# Patient Record
Sex: Male | Born: 1955 | Race: White | Hispanic: No | Marital: Married | Smoking: Former smoker
Health system: Southern US, Community
[De-identification: ages and names within clinical notes are randomized; demographics above are authoritative.]

## PROBLEM LIST (undated history)

## (undated) DIAGNOSIS — I2510 Atherosclerotic heart disease of native coronary artery without angina pectoris: Secondary | ICD-10-CM

## (undated) DIAGNOSIS — F32A Depression, unspecified: Secondary | ICD-10-CM

## (undated) DIAGNOSIS — I82409 Acute embolism and thrombosis of unspecified deep veins of unspecified lower extremity: Secondary | ICD-10-CM

## (undated) DIAGNOSIS — M549 Dorsalgia, unspecified: Secondary | ICD-10-CM

## (undated) DIAGNOSIS — F329 Major depressive disorder, single episode, unspecified: Secondary | ICD-10-CM

## (undated) DIAGNOSIS — J45909 Unspecified asthma, uncomplicated: Secondary | ICD-10-CM

## (undated) DIAGNOSIS — K922 Gastrointestinal hemorrhage, unspecified: Secondary | ICD-10-CM

## (undated) DIAGNOSIS — D649 Anemia, unspecified: Secondary | ICD-10-CM

## (undated) DIAGNOSIS — F1010 Alcohol abuse, uncomplicated: Secondary | ICD-10-CM

## (undated) DIAGNOSIS — I2699 Other pulmonary embolism without acute cor pulmonale: Secondary | ICD-10-CM

## (undated) DIAGNOSIS — E785 Hyperlipidemia, unspecified: Secondary | ICD-10-CM

## (undated) DIAGNOSIS — M1990 Unspecified osteoarthritis, unspecified site: Secondary | ICD-10-CM

## (undated) DIAGNOSIS — N529 Male erectile dysfunction, unspecified: Secondary | ICD-10-CM

## (undated) DIAGNOSIS — F419 Anxiety disorder, unspecified: Secondary | ICD-10-CM

## (undated) DIAGNOSIS — G8929 Other chronic pain: Secondary | ICD-10-CM

## (undated) DIAGNOSIS — G2581 Restless legs syndrome: Secondary | ICD-10-CM

## (undated) DIAGNOSIS — K219 Gastro-esophageal reflux disease without esophagitis: Secondary | ICD-10-CM

## (undated) HISTORY — DX: Other pulmonary embolism without acute cor pulmonale: I26.99

## (undated) HISTORY — PX: IVC FILTER PLACEMENT (ARMC HX): HXRAD1551

## (undated) HISTORY — DX: Major depressive disorder, single episode, unspecified: F32.9

## (undated) HISTORY — DX: Atherosclerotic heart disease of native coronary artery without angina pectoris: I25.10

## (undated) HISTORY — DX: Anxiety disorder, unspecified: F41.9

## (undated) HISTORY — DX: Gastro-esophageal reflux disease without esophagitis: K21.9

## (undated) HISTORY — PX: NOSE SURGERY: SHX723

## (undated) HISTORY — DX: Gastrointestinal hemorrhage, unspecified: K92.2

## (undated) HISTORY — DX: Male erectile dysfunction, unspecified: N52.9

## (undated) HISTORY — PX: TOTAL HIP ARTHROPLASTY: SHX124

## (undated) HISTORY — DX: Unspecified asthma, uncomplicated: J45.909

## (undated) HISTORY — DX: Unspecified osteoarthritis, unspecified site: M19.90

## (undated) HISTORY — DX: Anemia, unspecified: D64.9

## (undated) HISTORY — DX: Depression, unspecified: F32.A

## (undated) HISTORY — PX: JOINT REPLACEMENT: SHX530

## (undated) HISTORY — DX: Hyperlipidemia, unspecified: E78.5

## (undated) HISTORY — DX: Acute embolism and thrombosis of unspecified deep veins of unspecified lower extremity: I82.409

---

## 2004-03-24 ENCOUNTER — Emergency Department: Payer: Self-pay | Admitting: Emergency Medicine

## 2005-07-01 ENCOUNTER — Other Ambulatory Visit: Payer: Self-pay

## 2005-07-01 ENCOUNTER — Emergency Department: Payer: Self-pay | Admitting: Emergency Medicine

## 2005-07-12 ENCOUNTER — Encounter: Payer: Self-pay | Admitting: Orthopedic Surgery

## 2005-08-07 ENCOUNTER — Encounter: Payer: Self-pay | Admitting: Orthopedic Surgery

## 2006-02-07 DIAGNOSIS — I82409 Acute embolism and thrombosis of unspecified deep veins of unspecified lower extremity: Secondary | ICD-10-CM

## 2006-02-07 HISTORY — DX: Acute embolism and thrombosis of unspecified deep veins of unspecified lower extremity: I82.409

## 2006-04-08 HISTORY — PX: CORONARY ANGIOPLASTY WITH STENT PLACEMENT: SHX49

## 2006-04-21 ENCOUNTER — Inpatient Hospital Stay: Payer: Self-pay | Admitting: Internal Medicine

## 2006-04-21 ENCOUNTER — Other Ambulatory Visit: Payer: Self-pay

## 2006-04-24 ENCOUNTER — Other Ambulatory Visit: Payer: Self-pay

## 2006-05-07 ENCOUNTER — Inpatient Hospital Stay: Payer: Self-pay | Admitting: Internal Medicine

## 2006-05-07 ENCOUNTER — Other Ambulatory Visit: Payer: Self-pay

## 2006-05-08 ENCOUNTER — Other Ambulatory Visit: Payer: Self-pay

## 2006-06-21 ENCOUNTER — Encounter: Payer: Self-pay | Admitting: Cardiovascular Disease

## 2006-07-09 ENCOUNTER — Encounter: Payer: Self-pay | Admitting: Cardiovascular Disease

## 2006-08-08 ENCOUNTER — Encounter: Payer: Self-pay | Admitting: Cardiovascular Disease

## 2006-09-08 ENCOUNTER — Encounter: Payer: Self-pay | Admitting: Cardiovascular Disease

## 2006-10-09 ENCOUNTER — Encounter: Payer: Self-pay | Admitting: Cardiovascular Disease

## 2007-01-09 ENCOUNTER — Inpatient Hospital Stay: Payer: Self-pay | Admitting: Internal Medicine

## 2007-01-09 ENCOUNTER — Other Ambulatory Visit: Payer: Self-pay

## 2007-01-11 ENCOUNTER — Other Ambulatory Visit: Payer: Self-pay

## 2007-02-06 ENCOUNTER — Emergency Department: Payer: Self-pay | Admitting: Emergency Medicine

## 2007-02-08 DIAGNOSIS — D649 Anemia, unspecified: Secondary | ICD-10-CM

## 2007-02-08 HISTORY — DX: Anemia, unspecified: D64.9

## 2007-02-12 ENCOUNTER — Other Ambulatory Visit: Payer: Self-pay

## 2007-02-12 ENCOUNTER — Observation Stay: Payer: Self-pay | Admitting: Internal Medicine

## 2007-02-13 ENCOUNTER — Other Ambulatory Visit: Payer: Self-pay

## 2007-02-21 ENCOUNTER — Observation Stay: Payer: Self-pay | Admitting: Internal Medicine

## 2007-02-21 ENCOUNTER — Ambulatory Visit: Payer: Self-pay | Admitting: Oncology

## 2007-03-14 ENCOUNTER — Ambulatory Visit: Payer: Self-pay | Admitting: Gastroenterology

## 2007-07-09 HISTORY — PX: CARDIAC CATHETERIZATION: SHX172

## 2007-07-25 ENCOUNTER — Other Ambulatory Visit: Payer: Self-pay

## 2007-07-25 ENCOUNTER — Observation Stay: Payer: Self-pay | Admitting: Internal Medicine

## 2007-11-16 ENCOUNTER — Inpatient Hospital Stay: Payer: Self-pay | Admitting: Specialist

## 2007-11-16 ENCOUNTER — Other Ambulatory Visit: Payer: Self-pay

## 2008-09-15 ENCOUNTER — Ambulatory Visit: Payer: Self-pay | Admitting: Unknown Physician Specialty

## 2008-09-22 ENCOUNTER — Emergency Department: Payer: Self-pay | Admitting: Emergency Medicine

## 2008-10-23 ENCOUNTER — Ambulatory Visit: Payer: Self-pay | Admitting: Anesthesiology

## 2008-10-28 ENCOUNTER — Ambulatory Visit: Payer: Self-pay | Admitting: Anesthesiology

## 2008-12-02 ENCOUNTER — Ambulatory Visit: Payer: Self-pay | Admitting: Internal Medicine

## 2008-12-16 ENCOUNTER — Ambulatory Visit: Payer: Self-pay | Admitting: Anesthesiology

## 2009-03-05 ENCOUNTER — Ambulatory Visit: Payer: Self-pay | Admitting: Anesthesiology

## 2009-03-31 ENCOUNTER — Inpatient Hospital Stay: Payer: Self-pay | Admitting: Internal Medicine

## 2009-05-19 ENCOUNTER — Ambulatory Visit: Payer: Self-pay | Admitting: Anesthesiology

## 2009-09-15 ENCOUNTER — Ambulatory Visit: Payer: Self-pay | Admitting: Anesthesiology

## 2009-12-09 ENCOUNTER — Ambulatory Visit: Payer: Self-pay | Admitting: Otolaryngology

## 2009-12-14 ENCOUNTER — Ambulatory Visit: Payer: Self-pay | Admitting: Anesthesiology

## 2010-03-30 ENCOUNTER — Ambulatory Visit: Payer: Self-pay | Admitting: Anesthesiology

## 2010-11-09 ENCOUNTER — Encounter: Payer: Self-pay | Admitting: Cardiovascular Disease

## 2010-11-19 ENCOUNTER — Encounter: Payer: Self-pay | Admitting: Cardiovascular Disease

## 2010-11-22 ENCOUNTER — Ambulatory Visit: Payer: Self-pay | Admitting: Cardiovascular Disease

## 2010-11-26 ENCOUNTER — Ambulatory Visit: Payer: Self-pay | Admitting: Cardiovascular Disease

## 2010-12-13 ENCOUNTER — Encounter: Payer: Self-pay | Admitting: Cardiovascular Disease

## 2010-12-13 ENCOUNTER — Ambulatory Visit (INDEPENDENT_AMBULATORY_CARE_PROVIDER_SITE_OTHER): Payer: 59 | Admitting: Cardiovascular Disease

## 2010-12-13 VITALS — BP 131/87 | HR 72 | Ht 72.0 in | Wt 174.0 lb

## 2010-12-13 DIAGNOSIS — I2510 Atherosclerotic heart disease of native coronary artery without angina pectoris: Secondary | ICD-10-CM

## 2010-12-13 DIAGNOSIS — N529 Male erectile dysfunction, unspecified: Secondary | ICD-10-CM

## 2010-12-13 DIAGNOSIS — I10 Essential (primary) hypertension: Secondary | ICD-10-CM

## 2010-12-13 DIAGNOSIS — E785 Hyperlipidemia, unspecified: Secondary | ICD-10-CM

## 2010-12-13 MED ORDER — SILDENAFIL CITRATE 50 MG PO TABS: 50.0000 mg | ORAL_TABLET | Freq: Every day | ORAL | Status: AC | PRN

## 2010-12-13 NOTE — Patient Instructions (Signed)
Your physician wants you to follow-up in: 6 months in [place]. You will receive a reminder letter in the mail one-two months in advance. If you don't receive a letter, please call our office to schedule the follow-up appointment. Use Viagra 50 mg no more than 1 tablet daily as needed.

## 2010-12-14 ENCOUNTER — Encounter: Payer: Self-pay | Admitting: Cardiovascular Disease

## 2010-12-14 DIAGNOSIS — I2510 Atherosclerotic heart disease of native coronary artery without angina pectoris: Secondary | ICD-10-CM | POA: Insufficient documentation

## 2010-12-14 DIAGNOSIS — E785 Hyperlipidemia, unspecified: Secondary | ICD-10-CM | POA: Insufficient documentation

## 2010-12-14 DIAGNOSIS — N529 Male erectile dysfunction, unspecified: Secondary | ICD-10-CM | POA: Insufficient documentation

## 2010-12-14 DIAGNOSIS — I10 Essential (primary) hypertension: Secondary | ICD-10-CM | POA: Insufficient documentation

## 2010-12-14 NOTE — Assessment & Plan Note (Signed)
Would provide the patient with Viagra 50 mg as needed as this worked better for him in the past. There is no contraindication given that he is not on nitroglycerin and his cardiac situation has been overall stable.

## 2010-12-14 NOTE — Progress Notes (Signed)
HPI  This is a 55 year old male who is transferring his cardiac care from [facility]. He is well-known to me. He has known history of coronary artery disease status post angioplasty and drug-eluting stent placement to the LAD as well as OM and 2008. Later that year, he was diagnosed with pulmonary embolism and was treated with warfarin. White he was on triple therapy, he had GI bleed from unknown source. His Plavix was discontinued after a year of treatment. Also he was taken off warfarin after completion of treatment. He did have increased dyspnea and atypical chest discomfort in 2009. He had a cardiac catheterization done at that time which showed patent stents and mild disease in the right coronary artery. Since then, the patient has not had any new cardiac events. His most recent stress test and echocardiogram were done in October of 2011. Both of them were unremarkable. He seems to be doing very well at this time. He denies any chest pain or dyspnea. He does complain of erectile dysfunction. Dr. Ellsworth Lennox gave him samples for Cialis but he did not respond to that.  No Known Allergies   No current outpatient prescriptions on file prior to visit.     Past Medical History  Diagnosis Date  . Pulmonary embolism   . Anemia 2009    while he was on Warfarin, Aspirin and Plavix. Resolved.   . Blood in stool   . Coronary artery disease   . Hyperlipidemia   . Essential hypertension   . ED (erectile dysfunction)      Past Surgical History  Procedure Date  . Total hip arthroplasty   . Coronary angioplasty with stent placement 04/2006    Cypher DES to LAD and OM1  . Cardiac catheterization 07/2007    Patent LAD/OM stents. Stable 40% stenosis in proximal RCA     History reviewed. No pertinent family history.   History   Social History  . Marital Status: Married    Spouse Name: N/A    Number of Children: 5  . Years of Education: N/A   Occupational History  .  Building Maintenance    Social History Main Topics  . Smoking status: Former Smoker -- 1.5 packs/day for 30 years    Types: Cigarettes    Quit date: 04/08/2006  . Smokeless tobacco: Never Used  . Alcohol Use: No  . Drug Use: No  . Sexually Active: Not on file   Other Topics Concern  . Not on file   Social History Narrative  . No narrative on file     PHYSICAL EXAM   BP 131/87  Pulse 72  Ht 6' (1.829 m)  Wt 174 lb (78.926 kg)  BMI 23.60 kg/m2  Constitutional: He is oriented to person, place, and time. He appears well-developed and well-nourished. No distress.  HENT: No nasal discharge.  Head: Normocephalic and atraumatic.  Eyes: Pupils are equal, round, and reactive to light. Right eye exhibits no discharge. Left eye exhibits no discharge.  Neck: Normal range of motion. Neck supple. No JVD present. No thyromegaly present.  Cardiovascular: Normal rate, regular rhythm, normal heart sounds and intact distal pulses. Exam reveals no gallop and no friction rub.  No murmur heard.  Pulmonary/Chest: Effort normal and breath sounds normal. No stridor. No respiratory distress. He has no wheezes. He has no rales. He exhibits no tenderness.  Abdominal: Soft. Bowel sounds are normal. He exhibits no distension. There is no tenderness. There is no rebound and no guarding.  Musculoskeletal: Normal range of motion. He exhibits no edema and no tenderness.  Neurological: He is alert and oriented to person, place, and time. Coordination normal.  Skin: Skin is warm and dry. No rash noted. He is not diaphoretic. No erythema. No pallor.  Psychiatric: He has a normal mood and affect. His behavior is normal. Judgment and thought content normal.      ASSESSMENT AND PLAN

## 2010-12-14 NOTE — Assessment & Plan Note (Signed)
His blood pressure is well controlled. Continue current medications. 

## 2010-12-14 NOTE — Assessment & Plan Note (Signed)
He is on atorvastatin 40 mg once daily. It appears that his LDL is above target. Thus, Zetia 10 mg daily was added. Target LDL is less than 70.

## 2010-12-14 NOTE — Assessment & Plan Note (Signed)
The patient seems to be doing very well at this time. He denies any chest pain or dyspnea. Most recent noninvasive cardiac evaluation was in October of 2011 which was unremarkable. I recommend continuing medical therapy for now.

## 2011-08-29 ENCOUNTER — Telehealth: Payer: Self-pay

## 2011-08-29 ENCOUNTER — Encounter: Payer: Self-pay | Admitting: Cardiovascular Disease

## 2011-08-29 ENCOUNTER — Ambulatory Visit (INDEPENDENT_AMBULATORY_CARE_PROVIDER_SITE_OTHER): Payer: 59 | Admitting: Cardiovascular Disease

## 2011-08-29 VITALS — BP 120/82 | HR 80 | Ht 72.0 in | Wt 172.0 lb

## 2011-08-29 DIAGNOSIS — E785 Hyperlipidemia, unspecified: Secondary | ICD-10-CM

## 2011-08-29 DIAGNOSIS — I2510 Atherosclerotic heart disease of native coronary artery without angina pectoris: Secondary | ICD-10-CM

## 2011-08-29 MED ORDER — AMLODIPINE BESYLATE 5 MG PO TABS: 5.0000 mg | ORAL_TABLET | Freq: Every day | ORAL | Status: DC

## 2011-08-29 MED ORDER — EZETIMIBE-ATORVASTATIN 10-40 MG PO TABS: 1.0000 | ORAL_TABLET | Freq: Every day | ORAL | Status: DC

## 2011-08-29 MED ORDER — OMEPRAZOLE 20 MG PO CPDR: 20.0000 mg | DELAYED_RELEASE_CAPSULE | Freq: Every day | ORAL | Status: AC

## 2011-08-29 MED ORDER — METOPROLOL TARTRATE 25 MG PO TABS: 25.0000 mg | ORAL_TABLET | Freq: Two times a day (BID) | ORAL | Status: DC

## 2011-08-29 MED ORDER — RAMIPRIL 5 MG PO CAPS: 5.0000 mg | ORAL_CAPSULE | Freq: Every day | ORAL | Status: DC

## 2011-08-29 NOTE — Progress Notes (Signed)
HPI  This is a 56 year old male who is here today for a followup visit. He has known history of coronary artery disease status post angioplasty and drug-eluting stent placement to the LAD as well as OM and 2008. Later that year, he was diagnosed with pulmonary embolism and was treated with warfarin. While he was on triple therapy, he had GI bleed from unknown source. His Plavix was discontinued after a year of treatment. Also he was taken off warfarin after completion of treatment. He did have increased dyspnea and atypical chest discomfort in 2009. He had a cardiac catheterization done at that time which showed patent stents and mild disease in the right coronary artery. Since then, the patient has not had any new cardiac events. His most recent stress test and echocardiogram were done in October of 2011. Both of them were unremarkable.  He continues to do well. He denies any chest pain, dyspnea or palpitations. He occasionally gets dizzy when he changes position suddenly. He has been taking his medications regularly but is having hard time affording Zetia.     No Known Allergies   Current Outpatient Prescriptions on File Prior to Visit  Medication Sig Dispense Refill  . amLODipine (NORVASC) 5 MG tablet Take 5 mg by mouth daily.      . cyclobenzaprine (FLEXERIL) 10 MG tablet Take 1 tablet by mouth Twice daily.      . Ferrous Sulfate (PX IRON) 27 MG TABS Take 1 tablet by mouth daily.        Marland Kitchen gabapentin (NEURONTIN) 300 MG capsule Take 1 capsule by mouth Twice daily.      . metoprolol tartrate (LOPRESSOR) 25 MG tablet Take 25 mg by mouth 2 (two) times daily.        Marland Kitchen omeprazole (PRILOSEC) 20 MG capsule Take 20 mg by mouth daily.        . Potassium Gluconate 595 MG CAPS Take 1 capsule by mouth daily.        . promethazine (PHENERGAN) 25 MG tablet Take 1 tablet by mouth Every 4 hours as needed.      . ramipril (ALTACE) 5 MG capsule Take 1 capsule by mouth Daily.      Marland Kitchen tiZANidine (ZANAFLEX) 2  MG tablet Take 2 mg by mouth at bedtime.           Past Medical History  Diagnosis Date  . Pulmonary embolism   . Anemia 2009    while he was on Warfarin, Aspirin and Plavix. Resolved.   . Blood in stool   . Coronary artery disease   . Hyperlipidemia   . Essential hypertension   . ED (erectile dysfunction)      Past Surgical History  Procedure Date  . Total hip arthroplasty   . Coronary angioplasty with stent placement 04/2006    Cypher DES to LAD and OM1  . Cardiac catheterization 07/2007    Patent LAD/OM stents. Stable 40% stenosis in proximal RCA     History reviewed. No pertinent family history.   History   Social History  . Marital Status: Married    Spouse Name: N/A    Number of Children: 5  . Years of Education: N/A   Occupational History  . Building Maintenance    Social History Main Topics  . Smoking status: Former Smoker -- 1.5 packs/day for 30 years    Types: Cigarettes    Quit date: 04/08/2006  . Smokeless tobacco: Never Used  . Alcohol Use: No  .  Drug Use: No  . Sexually Active: Not on file   Other Topics Concern  . Not on file   Social History Narrative  . No narrative on file      PHYSICAL EXAM   BP 120/82  Pulse 80  Ht 6' (1.829 m)  Wt 172 lb (78.019 kg)  BMI 23.33 kg/m2 Constitutional: He is oriented to person, place, and time. He appears well-developed and well-nourished. No distress.  HENT: No nasal discharge.  Head: Normocephalic and atraumatic.  Eyes: Pupils are equal and round. Right eye exhibits no discharge. Left eye exhibits no discharge.  Neck: Normal range of motion. Neck supple. No JVD present. No thyromegaly present.  Cardiovascular: Normal rate, regular rhythm, normal heart sounds and. Exam reveals no gallop and no friction rub. No murmur heard.  Pulmonary/Chest: Effort normal and breath sounds normal. No stridor. No respiratory distress. He has no wheezes. He has no rales. He exhibits no tenderness.  Abdominal:  Soft. Bowel sounds are normal. He exhibits no distension. There is no tenderness. There is no rebound and no guarding.  Musculoskeletal: Normal range of motion. He exhibits no edema and no tenderness.  Neurological: He is alert and oriented to person, place, and time. Coordination normal.  Skin: Skin is warm and dry. No rash noted. He is not diaphoretic. No erythema. No pallor.  Psychiatric: He has a normal mood and affect. His behavior is normal. Judgment and thought content normal.       EKG: Normal sinus rhythm.   ASSESSMENT AND PLAN

## 2011-08-29 NOTE — Patient Instructions (Addendum)
Stop Lipitor Stop Zetia Start Liptruzet 40/10 mg at bedtime. If too expensive, let us know.  Follow up in 6 months

## 2011-08-29 NOTE — Assessment & Plan Note (Signed)
He is on both atorvastatin and Zetia. His co-pay $60. I will stop both and switch him to Liptruzet 40/10 mg at bedtime. I provided him with a co-pay card. If this is still too expensive, we can consider switching him to high-dose Crestor.

## 2011-08-29 NOTE — Telephone Encounter (Signed)
Refill sent for ramipril, metoprolol tart, amlodipine and omeprazole to [facility].

## 2011-08-29 NOTE — Assessment & Plan Note (Signed)
His blood pressure is well controlled. Continue current medications. 

## 2011-08-29 NOTE — Assessment & Plan Note (Signed)
He is doing very well at this time with no symptoms suggestive of angina, heart failure or arrhythmia. Continue medical therapy.

## 2012-03-12 ENCOUNTER — Encounter: Payer: Self-pay | Admitting: Cardiovascular Disease

## 2012-03-12 ENCOUNTER — Ambulatory Visit (INDEPENDENT_AMBULATORY_CARE_PROVIDER_SITE_OTHER): Payer: 59 | Admitting: Cardiovascular Disease

## 2012-03-12 VITALS — BP 118/74 | HR 89 | Ht 72.0 in | Wt 171.0 lb

## 2012-03-12 DIAGNOSIS — M25559 Pain in unspecified hip: Secondary | ICD-10-CM | POA: Insufficient documentation

## 2012-03-12 DIAGNOSIS — I2510 Atherosclerotic heart disease of native coronary artery without angina pectoris: Secondary | ICD-10-CM

## 2012-03-12 DIAGNOSIS — E785 Hyperlipidemia, unspecified: Secondary | ICD-10-CM

## 2012-03-12 NOTE — Assessment & Plan Note (Signed)
This is at the site of previous hip surgery. He asked if it's safe for him to use Cymbalta for chronic pain. There is no contraindication from a cardiac standpoint. I asked him to discuss this with PCP.

## 2012-03-12 NOTE — Patient Instructions (Addendum)
Continue same medications  Follow up in 1 year

## 2012-03-12 NOTE — Assessment & Plan Note (Signed)
He is doing well with no symptoms suggestive of angina. Continue medical therapy. 

## 2012-03-12 NOTE — Assessment & Plan Note (Signed)
His blood pressure is well controlled. Continue current medications. 

## 2012-03-12 NOTE — Progress Notes (Signed)
HPI  This is a 57 year old male who is here today for a followup visit. He has known history of coronary artery disease status post angioplasty and drug-eluting stent placement to the LAD as well as OM and 2008. Later that year, he was diagnosed with pulmonary embolism and was treated with warfarin. While he was on triple therapy, he had GI bleed from unknown source. His Plavix was discontinued after a year of treatment. Also he was taken off warfarin after completion of treatment. He did have increased dyspnea and atypical chest discomfort in 2009. He had a cardiac catheterization done at that time which showed patent stents and mild disease in the right coronary artery. Since then, the patient has not had any new cardiac events. His most recent stress test and echocardiogram were done in October of 2011. Both of them were unremarkable.  He continues to do well. He denies any chest pain, dyspnea or palpitations.  He continues to have problems with the left hip related to previous hip surgery. He gets localized steroids injections.   No Known Allergies   Current Outpatient Prescriptions on File Prior to Visit  Medication Sig Dispense Refill  . amLODipine (NORVASC) 5 MG tablet Take 1 tablet (5 mg total) by mouth daily.  30 tablet  6  . aspirin 81 MG tablet Take 81 mg by mouth daily.      . cyclobenzaprine (FLEXERIL) 10 MG tablet Take 1 tablet by mouth Twice daily.      . Ezetimibe-Atorvastatin (LIPTRUZET) 10-40 MG TABS Take 1 tablet by mouth at bedtime.  30 tablet  6  . Ferrous Sulfate (PX IRON) 27 MG TABS Take 1 tablet by mouth daily.        Marland Kitchen gabapentin (NEURONTIN) 300 MG capsule Take 1 capsule by mouth Twice daily.      Marland Kitchen HYDROcodone-acetaminophen (NORCO) 10-325 MG per tablet Take 1 tablet by mouth 3 (three) times daily.      . metoprolol tartrate (LOPRESSOR) 25 MG tablet Take 1 tablet (25 mg total) by mouth 2 (two) times daily.  60 tablet  6  . omeprazole (PRILOSEC) 20 MG capsule Take 1  capsule (20 mg total) by mouth daily.  30 capsule  6  . Potassium Gluconate 595 MG CAPS Take 1 capsule by mouth daily.        . ramipril (ALTACE) 5 MG capsule Take 1 capsule (5 mg total) by mouth daily.  30 capsule  6  . SERTRALINE HCL PO Take 75 mg by mouth daily.      Marland Kitchen tiZANidine (ZANAFLEX) 2 MG tablet Take 2 mg by mouth at bedtime.           Past Medical History  Diagnosis Date  . Pulmonary embolism   . Anemia 2009    while he was on Warfarin, Aspirin and Plavix. Resolved.   . Blood in stool   . Coronary artery disease   . Hyperlipidemia   . Essential hypertension   . ED (erectile dysfunction)      Past Surgical History  Procedure Date  . Total hip arthroplasty   . Coronary angioplasty with stent placement 04/2006    Cypher DES to LAD and OM1  . Cardiac catheterization 07/2007    Patent LAD/OM stents. Stable 40% stenosis in proximal RCA     Family History  Problem Relation Age of Onset  . Hypertension Mother      History   Social History  . Marital Status: Married  Spouse Name: N/A    Number of Children: 5  . Years of Education: N/A   Occupational History  . Building Maintenance    Social History Main Topics  . Smoking status: Former Smoker -- 1.5 packs/day for 30 years    Types: Cigarettes    Quit date: 04/08/2006  . Smokeless tobacco: Never Used  . Alcohol Use: No  . Drug Use: No  . Sexually Active: Not on file   Other Topics Concern  . Not on file   Social History Narrative  . No narrative on file      PHYSICAL EXAM   BP 118/74  Pulse 89  Ht 6' (1.829 m)  Wt 171 lb (77.565 kg)  BMI 23.19 kg/m2 Constitutional: He is oriented to person, place, and time. He appears well-developed and well-nourished. No distress.  HENT: No nasal discharge.  Head: Normocephalic and atraumatic.  Eyes: Pupils are equal and round. Right eye exhibits no discharge. Left eye exhibits no discharge.  Neck: Normal range of motion. Neck supple. No JVD present.  No thyromegaly present.  Cardiovascular: Normal rate, regular rhythm, normal heart sounds and. Exam reveals no gallop and no friction rub. No murmur heard.  Pulmonary/Chest: Effort normal and breath sounds normal. No stridor. No respiratory distress. He has no wheezes. He has no rales. He exhibits no tenderness.  Abdominal: Soft. Bowel sounds are normal. He exhibits no distension. There is no tenderness. There is no rebound and no guarding.  Musculoskeletal: Normal range of motion. He exhibits no edema and no tenderness.  Neurological: He is alert and oriented to person, place, and time. Coordination normal.  Skin: Skin is warm and dry. No rash noted. He is not diaphoretic. No erythema. No pallor.  Psychiatric: He has a normal mood and affect. His behavior is normal. Judgment and thought content normal.       EKG: Normal sinus rhythm.   ASSESSMENT AND PLAN

## 2012-03-12 NOTE — Assessment & Plan Note (Signed)
This is being managed by Dr. Ellsworth Lennox.

## 2012-07-30 ENCOUNTER — Encounter: Payer: Self-pay | Admitting: Cardiovascular Disease

## 2012-10-15 ENCOUNTER — Emergency Department: Payer: Self-pay | Admitting: Emergency Medicine

## 2012-10-15 LAB — COMPREHENSIVE METABOLIC PANEL
Albumin: 3.5 g/dL (ref 3.4–5.0)
BUN: 13 mg/dL (ref 7–18)
Bilirubin,Total: 0.6 mg/dL (ref 0.2–1.0)
Calcium, Total: 9.3 mg/dL (ref 8.5–10.1)
Chloride: 108 mmol/L — ABNORMAL HIGH (ref 98–107)
Co2: 29 mmol/L (ref 21–32)
EGFR (African American): >60
EGFR (Non-African Amer.): >60
Glucose: 99 mg/dL (ref 65–99)
Osmolality: 278 (ref 275–301)
SGPT (ALT): 17 U/L (ref 12–78)
Sodium: 139 mmol/L (ref 136–145)
Total Protein: 7.1 g/dL (ref 6.4–8.2)

## 2012-10-15 LAB — CBC
MCH: 30.8 pg (ref 26.0–34.0)
MCV: 89 fL (ref 80–100)
RBC: 5.03 10*6/uL (ref 4.40–5.90)
RDW: 13.2 % (ref 11.5–14.5)

## 2012-10-15 LAB — TROPONIN I: Troponin-I: <0.02 ng/mL

## 2012-12-13 ENCOUNTER — Other Ambulatory Visit: Payer: Self-pay

## 2013-05-20 ENCOUNTER — Encounter: Payer: Self-pay | Admitting: *Deleted

## 2013-05-21 ENCOUNTER — Ambulatory Visit (INDEPENDENT_AMBULATORY_CARE_PROVIDER_SITE_OTHER): Payer: BC Managed Care – PPO | Admitting: Cardiovascular Disease

## 2013-05-21 ENCOUNTER — Encounter: Payer: Self-pay | Admitting: Cardiovascular Disease

## 2013-05-21 VITALS — BP 112/77 | HR 74 | Ht 72.0 in | Wt 168.0 lb

## 2013-05-21 DIAGNOSIS — E785 Hyperlipidemia, unspecified: Secondary | ICD-10-CM

## 2013-05-21 DIAGNOSIS — I10 Essential (primary) hypertension: Secondary | ICD-10-CM

## 2013-05-21 DIAGNOSIS — N529 Male erectile dysfunction, unspecified: Secondary | ICD-10-CM

## 2013-05-21 DIAGNOSIS — I2510 Atherosclerotic heart disease of native coronary artery without angina pectoris: Secondary | ICD-10-CM

## 2013-05-21 NOTE — Assessment & Plan Note (Signed)
Blood pressure is under excellent control. 

## 2013-05-21 NOTE — Assessment & Plan Note (Signed)
He is slightly improved on small dose Cialis. I favor avoiding testosterone if possible given the unclear cardiac safety profile testosterone supplementation.

## 2013-05-21 NOTE — Assessment & Plan Note (Signed)
He is doing very well with no symptoms suggestive of angina. Continue medical.

## 2013-05-21 NOTE — Progress Notes (Signed)
Primary care physician: Dr. Elijio Miles  HPI  This is a 58 year old male who is here today for a followup visit. He has known history of coronary artery disease status post angioplasty and drug-eluting stent placement to the LAD as well as OM in 2008. Later that year, he was diagnosed with pulmonary embolism and was treated with warfarin. While he was on triple therapy, he had GI bleed from unknown source. His Plavix was discontinued after a year of treatment. Also he was taken off warfarin after completion of treatment. He did have increased dyspnea and atypical chest discomfort in 2009. He had a cardiac catheterization done at that time which showed patent stents and mild disease in the right coronary artery. Since then, the patient has not had any new cardiac events. His most recent stress test and echocardiogram were done in October of 2011. Both of them were unremarkable.  He continues to do well. He denies any chest pain, dyspnea or palpitations.  He continues to have problems with erectile dysfunction and low testosterone.   No Known Allergies   Current Outpatient Prescriptions on File Prior to Visit  Medication Sig Dispense Refill  . amLODipine (NORVASC) 5 MG tablet Take 1 tablet (5 mg total) by mouth daily.  30 tablet  6  . aspirin 81 MG tablet Take 81 mg by mouth daily.      . cyclobenzaprine (FLEXERIL) 10 MG tablet Take 1 tablet by mouth Twice daily.      Marland Kitchen ezetimibe (ZETIA) 10 MG tablet Take 10 mg by mouth daily.      . ferrous sulfate 325 (65 FE) MG tablet Take 325 mg by mouth daily with breakfast.      . fexofenadine (ALLEGRA) 180 MG tablet Take 180 mg by mouth daily.      . fluticasone (FLONASE) 50 MCG/ACT nasal spray Place 1 spray into both nostrils daily.      Marland Kitchen gabapentin (NEURONTIN) 300 MG capsule Take 1 capsule by mouth Twice daily.      Marland Kitchen HYDROcodone-acetaminophen (NORCO) 10-325 MG per tablet Take 1 tablet by mouth 3 (three) times daily.      . metoprolol tartrate  (LOPRESSOR) 25 MG tablet Take 1 tablet (25 mg total) by mouth 2 (two) times daily.  60 tablet  6  . omeprazole (PRILOSEC) 20 MG capsule Take 1 capsule (20 mg total) by mouth daily.  30 capsule  6  . promethazine (PHENERGAN) 25 MG tablet Take 25 mg by mouth every 6 (six) hours as needed for nausea or vomiting.      . ramipril (ALTACE) 5 MG capsule Take 1 capsule (5 mg total) by mouth daily.  30 capsule  6  . rosuvastatin (CRESTOR) 20 MG tablet Take 20 mg by mouth daily.      . SERTRALINE HCL PO Take 75 mg by mouth daily.      . tadalafil (CIALIS) 5 MG tablet Take 5 mg by mouth daily as needed for erectile dysfunction.      Marland Kitchen tiZANidine (ZANAFLEX) 2 MG tablet Take 2 mg by mouth at bedtime.         No current facility-administered medications on file prior to visit.     Past Medical History  Diagnosis Date  . Pulmonary embolism   . Anemia 2009    while he was on Warfarin, Aspirin and Plavix. Resolved.   . Blood in stool   . Coronary artery disease   . Hyperlipidemia   . Essential hypertension   .  ED (erectile dysfunction)   . Asthma      Past Surgical History  Procedure Laterality Date  . Total hip arthroplasty    . Coronary angioplasty with stent placement  04/2006    Cypher DES to LAD and OM1  . Cardiac catheterization  07/2007    Patent LAD/OM stents. Stable 40% stenosis in proximal RCA     Family History  Problem Relation Age of Onset  . Hypertension Mother      History   Social History  . Marital Status: Married    Spouse Name: N/A    Number of Children: 29  . Years of Education: N/A   Occupational History  . Building Maintenance    Social History Main Topics  . Smoking status: Former Smoker -- 1.50 packs/day for 30 years    Types: Cigarettes    Quit date: 04/08/2006  . Smokeless tobacco: Never Used  . Alcohol Use: No  . Drug Use: No  . Sexual Activity: Not on file   Other Topics Concern  . Not on file   Social History Narrative  . No narrative on  file      PHYSICAL EXAM   BP 112/77  Pulse 74  Ht 6' (1.829 m)  Wt 168 lb (76.204 kg)  BMI 22.78 kg/m2 Constitutional: He is oriented to person, place, and time. He appears well-developed and well-nourished. No distress.  HENT: No nasal discharge.  Head: Normocephalic and atraumatic.  Eyes: Pupils are equal and round. Right eye exhibits no discharge. Left eye exhibits no discharge.  Neck: Normal range of motion. Neck supple. No JVD present. No thyromegaly present.  Cardiovascular: Normal rate, regular rhythm, normal heart sounds and. Exam reveals no gallop and no friction rub. No murmur heard.  Pulmonary/Chest: Effort normal and breath sounds normal. No stridor. No respiratory distress. He has no wheezes. He has no rales. He exhibits no tenderness.  Abdominal: Soft. Bowel sounds are normal. He exhibits no distension. There is no tenderness. There is no rebound and no guarding.  Musculoskeletal: Normal range of motion. He exhibits no edema and no tenderness.  Neurological: He is alert and oriented to person, place, and time. Coordination normal.  Skin: Skin is warm and dry. No rash noted. He is not diaphoretic. No erythema. No pallor.  Psychiatric: He has a normal mood and affect. His behavior is normal. Judgment and thought content normal.       EKG: Normal sinus rhythm.   ASSESSMENT AND PLAN

## 2013-05-21 NOTE — Assessment & Plan Note (Signed)
He was switched from atorvastatin plus Zetia to Crestor 20 mg once daily. Fasting lipid profile in January showed a total cholesterol of 114, HDL 53, platelets of 95 and LDL of 42.

## 2013-05-21 NOTE — Patient Instructions (Signed)
Continue same medications.   Your physician wants you to follow-up in: 1 year.  You will receive a reminder letter in the mail two months in advance. If you don't receive a letter, please call our office to schedule the follow-up appointment.  

## 2013-08-27 ENCOUNTER — Ambulatory Visit: Payer: Self-pay | Admitting: Neurology

## 2014-05-27 ENCOUNTER — Ambulatory Visit (INDEPENDENT_AMBULATORY_CARE_PROVIDER_SITE_OTHER): Payer: BLUE CROSS/BLUE SHIELD | Admitting: Cardiovascular Disease

## 2014-05-27 ENCOUNTER — Encounter: Payer: Self-pay | Admitting: Cardiovascular Disease

## 2014-05-27 VITALS — BP 119/69 | HR 65 | Ht 72.0 in | Wt 150.5 lb

## 2014-05-27 DIAGNOSIS — N528 Other male erectile dysfunction: Secondary | ICD-10-CM

## 2014-05-27 DIAGNOSIS — I2510 Atherosclerotic heart disease of native coronary artery without angina pectoris: Secondary | ICD-10-CM

## 2014-05-27 DIAGNOSIS — I10 Essential (primary) hypertension: Secondary | ICD-10-CM | POA: Diagnosis not present

## 2014-05-27 DIAGNOSIS — E785 Hyperlipidemia, unspecified: Secondary | ICD-10-CM

## 2014-05-27 MED ORDER — RAMIPRIL 10 MG PO CAPS: 10.0000 mg | ORAL_CAPSULE | Freq: Every day | ORAL | Status: DC

## 2014-05-27 NOTE — Assessment & Plan Note (Signed)
He is doing very well with no symptoms suggestive of angina. I recommend continuing medical therapy.

## 2014-05-27 NOTE — Patient Instructions (Signed)
Medication Instructions: 1) Stop metoprolol 2) Increase altace (ramipril) to 10 mg once daily  Labwork: - none  Procedures/Testing: - none  Follow-Up: - Your physician wants you to follow-up in: 1 year with Dr. Fletcher Anon. You will receive a reminder letter in the mail two months in advance. If you don't receive a letter, please call our office to schedule the follow-up appointment.  Any Additional Special Instructions Will Be Listed Below (If Applicable).

## 2014-05-27 NOTE — Assessment & Plan Note (Signed)
Continue treatment with rosuvastatin with a target LDL of less than 70. 

## 2014-05-27 NOTE — Assessment & Plan Note (Signed)
Due to significant problems with erectile dysfunction, I discontinued metoprolol and increase the dose of ramipril to 10 mg once daily.

## 2014-05-27 NOTE — Progress Notes (Signed)
Primary care physician: Dr. Elijio Miles  HPI  This is a 59 year old male who is here today for a followup visit. He has known history of coronary artery disease status post angioplasty and drug-eluting stent placement to the LAD as well as OM in 2008. Later that year, he was diagnosed with pulmonary embolism and was treated with warfarin. While he was on triple therapy, he had GI bleed from unknown source. His Plavix was discontinued after a year of treatment. Also he was taken off warfarin after completion of treatment. He did have increased dyspnea and atypical chest discomfort in 2009. He had a cardiac catheterization done at that time which showed patent stents and mild disease in the right coronary artery. Since then, the patient has not had any new cardiac events. His most recent stress test and echocardiogram were done in October of 2011. Both of them were unremarkable.  He continues to do well. He denies any chest pain, dyspnea or palpitations.  He continues to have problems with erectile dysfunction and low testosterone.   No Known Allergies   Current Outpatient Prescriptions on File Prior to Visit  Medication Sig Dispense Refill  . amLODipine (NORVASC) 5 MG tablet Take 1 tablet (5 mg total) by mouth daily. 30 tablet 6  . aspirin 81 MG tablet Take 81 mg by mouth daily.    . cyclobenzaprine (FLEXERIL) 10 MG tablet Take 1 tablet by mouth Twice daily.    . ferrous sulfate 325 (65 FE) MG tablet Take 325 mg by mouth daily with breakfast.    . fexofenadine (ALLEGRA) 180 MG tablet Take 180 mg by mouth daily.    . fluticasone (FLONASE) 50 MCG/ACT nasal spray Place 1 spray into both nostrils daily.    Marland Kitchen gabapentin (NEURONTIN) 300 MG capsule Take 1 capsule by mouth Twice daily.    Marland Kitchen HYDROcodone-acetaminophen (NORCO) 10-325 MG per tablet Take 1 tablet by mouth 3 (three) times daily.    . metoprolol tartrate (LOPRESSOR) 25 MG tablet Take 1 tablet (25 mg total) by mouth 2 (two) times daily. 60  tablet 6  . omeprazole (PRILOSEC) 20 MG capsule Take 1 capsule (20 mg total) by mouth daily. 30 capsule 6  . promethazine (PHENERGAN) 25 MG tablet Take 25 mg by mouth every 6 (six) hours as needed for nausea or vomiting.    . ramipril (ALTACE) 5 MG capsule Take 1 capsule (5 mg total) by mouth daily. 30 capsule 6  . rosuvastatin (CRESTOR) 20 MG tablet Take 20 mg by mouth daily.    . SERTRALINE HCL PO Take 75 mg by mouth daily.    . tadalafil (CIALIS) 5 MG tablet Take 5 mg by mouth daily as needed for erectile dysfunction.    Marland Kitchen tiZANidine (ZANAFLEX) 2 MG tablet Take 2 mg by mouth at bedtime.       No current facility-administered medications on file prior to visit.     Past Medical History  Diagnosis Date  . Pulmonary embolism   . Anemia 2009    while he was on Warfarin, Aspirin and Plavix. Resolved.   . Blood in stool   . Coronary artery disease   . Hyperlipidemia   . Essential hypertension   . ED (erectile dysfunction)   . Asthma      Past Surgical History  Procedure Laterality Date  . Total hip arthroplasty    . Coronary angioplasty with stent placement  04/2006    Cypher DES to LAD and OM1  . Cardiac catheterization  07/2007    Patent LAD/OM stents. Stable 40% stenosis in proximal RCA     Family History  Problem Relation Age of Onset  . Hypertension Mother      History   Social History  . Marital Status: Married    Spouse Name: N/A  . Number of Children: 5  . Years of Education: N/A   Occupational History  . Building Maintenance    Social History Main Topics  . Smoking status: Former Smoker -- 1.50 packs/day for 30 years    Types: Cigarettes    Quit date: 04/08/2006  . Smokeless tobacco: Never Used  . Alcohol Use: No  . Drug Use: No  . Sexual Activity: Not on file   Other Topics Concern  . Not on file   Social History Narrative      PHYSICAL EXAM   BP 119/69 mmHg  Pulse 65  Ht 6' (1.829 m)  Wt 150 lb 8 oz (68.266 kg)  BMI 20.41  kg/m2 Constitutional: He is oriented to person, place, and time. He appears well-developed and well-nourished. No distress.  HENT: No nasal discharge.  Head: Normocephalic and atraumatic.  Eyes: Pupils are equal and round. Right eye exhibits no discharge. Left eye exhibits no discharge.  Neck: Normal range of motion. Neck supple. No JVD present. No thyromegaly present.  Cardiovascular: Normal rate, regular rhythm, normal heart sounds and. Exam reveals no gallop and no friction rub. No murmur heard.  Pulmonary/Chest: Effort normal and breath sounds normal. No stridor. No respiratory distress. He has no wheezes. He has no rales. He exhibits no tenderness.  Abdominal: Soft. Bowel sounds are normal. He exhibits no distension. There is no tenderness. There is no rebound and no guarding.  Musculoskeletal: Normal range of motion. He exhibits no edema and no tenderness.  Neurological: He is alert and oriented to person, place, and time. Coordination normal.  Skin: Skin is warm and dry. No rash noted. He is not diaphoretic. No erythema. No pallor.  Psychiatric: He has a normal mood and affect. His behavior is normal. Judgment and thought content normal.       EKG: Normal sinus rhythm.   ASSESSMENT AND PLAN

## 2014-05-27 NOTE — Assessment & Plan Note (Signed)
He reports significant issues with low testosterone. I think it's reasonable to try testosterone replacement therapy if his level is low. There has been conflicting data about cardiovascular safety of testosterone overall.

## 2014-07-25 ENCOUNTER — Telehealth: Payer: Self-pay

## 2014-07-25 NOTE — Telephone Encounter (Signed)
S/w patient who states he had nerve conduction test Tuesday (ordered by Dr. Elijio Miles) and since then he's had increasing numbness and tingling in feet and legs. States gabapentin was increased to 400mg  TID. Blood work done in his office this week. Pt awaiting results. Indicates he called TeJan-Sie's office this morning and left message and is awaiting return phone call. Advised patient to continue to try and get in touch with TeJan-Sie's office to report new symptoms since nerve conduction test.  Pt verbalized understanding and states he will continue to call office.

## 2014-07-25 NOTE — Telephone Encounter (Signed)
Pt states he is having really bad leg cramps in his legs and feet, also numbness, states DR. Iona sent him to have a nerve conduction test on Tuesday of this week. Wants Dr. Fletcher Anon to know what is going on also, and what his recommendations are. States he is to f/u with Dr. Lavone Orn in 2 weeks, but doesn't feel like he can wait that long. Please call.

## 2014-08-26 DIAGNOSIS — G629 Polyneuropathy, unspecified: Secondary | ICD-10-CM | POA: Insufficient documentation

## 2014-09-01 ENCOUNTER — Ambulatory Visit: Payer: BLUE CROSS/BLUE SHIELD | Admitting: Cardiovascular Disease

## 2014-10-27 ENCOUNTER — Emergency Department: Payer: BLUE CROSS/BLUE SHIELD

## 2014-10-27 ENCOUNTER — Encounter: Payer: Self-pay | Admitting: Emergency Medicine

## 2014-10-27 ENCOUNTER — Inpatient Hospital Stay
Admission: EM | Admit: 2014-10-27 | Discharge: 2014-10-30 | DRG: 556 | Disposition: A | Discharge status: Home / Self Care | Payer: BLUE CROSS/BLUE SHIELD | Attending: Internal Medicine | Admitting: Internal Medicine

## 2014-10-27 DIAGNOSIS — N289 Disorder of kidney and ureter, unspecified: Secondary | ICD-10-CM

## 2014-10-27 DIAGNOSIS — E538 Deficiency of other specified B group vitamins: Secondary | ICD-10-CM | POA: Diagnosis present

## 2014-10-27 DIAGNOSIS — Z7982 Long term (current) use of aspirin: Secondary | ICD-10-CM

## 2014-10-27 DIAGNOSIS — I2510 Atherosclerotic heart disease of native coronary artery without angina pectoris: Secondary | ICD-10-CM | POA: Diagnosis present

## 2014-10-27 DIAGNOSIS — D72829 Elevated white blood cell count, unspecified: Secondary | ICD-10-CM

## 2014-10-27 DIAGNOSIS — D649 Anemia, unspecified: Secondary | ICD-10-CM | POA: Diagnosis present

## 2014-10-27 DIAGNOSIS — Z86711 Personal history of pulmonary embolism: Secondary | ICD-10-CM

## 2014-10-27 DIAGNOSIS — Z8249 Family history of ischemic heart disease and other diseases of the circulatory system: Secondary | ICD-10-CM | POA: Diagnosis not present

## 2014-10-27 DIAGNOSIS — Z87891 Personal history of nicotine dependence: Secondary | ICD-10-CM | POA: Diagnosis not present

## 2014-10-27 DIAGNOSIS — B999 Unspecified infectious disease: Secondary | ICD-10-CM

## 2014-10-27 DIAGNOSIS — M609 Myositis, unspecified: Principal | ICD-10-CM | POA: Diagnosis present

## 2014-10-27 DIAGNOSIS — I10 Essential (primary) hypertension: Secondary | ICD-10-CM | POA: Diagnosis present

## 2014-10-27 DIAGNOSIS — J45909 Unspecified asthma, uncomplicated: Secondary | ICD-10-CM | POA: Diagnosis present

## 2014-10-27 DIAGNOSIS — Z79899 Other long term (current) drug therapy: Secondary | ICD-10-CM | POA: Diagnosis not present

## 2014-10-27 DIAGNOSIS — Z96649 Presence of unspecified artificial hip joint: Secondary | ICD-10-CM | POA: Diagnosis present

## 2014-10-27 DIAGNOSIS — N179 Acute kidney failure, unspecified: Secondary | ICD-10-CM | POA: Diagnosis present

## 2014-10-27 DIAGNOSIS — L03115 Cellulitis of right lower limb: Secondary | ICD-10-CM | POA: Diagnosis present

## 2014-10-27 DIAGNOSIS — Z951 Presence of aortocoronary bypass graft: Secondary | ICD-10-CM | POA: Diagnosis not present

## 2014-10-27 DIAGNOSIS — M729 Fibroblastic disorder, unspecified: Secondary | ICD-10-CM | POA: Diagnosis present

## 2014-10-27 DIAGNOSIS — E785 Hyperlipidemia, unspecified: Secondary | ICD-10-CM | POA: Diagnosis present

## 2014-10-27 LAB — COMPREHENSIVE METABOLIC PANEL
ALT: 11 U/L — ABNORMAL LOW (ref 17–63)
ANION GAP: 7 (ref 5–15)
AST: 18 U/L (ref 15–41)
Albumin: 3.3 g/dL — ABNORMAL LOW (ref 3.5–5.0)
Alkaline Phosphatase: 41 U/L (ref 38–126)
BILIRUBIN TOTAL: 0.7 mg/dL (ref 0.3–1.2)
BUN: 20 mg/dL (ref 6–20)
CO2: 25 mmol/L (ref 22–32)
Calcium: 8.2 mg/dL — ABNORMAL LOW (ref 8.9–10.3)
Chloride: 103 mmol/L (ref 101–111)
Creatinine, Ser: 1.3 mg/dL — ABNORMAL HIGH (ref 0.61–1.24)
GFR, EST NON AFRICAN AMERICAN: 58 mL/min — AB (ref >60)
Glucose, Bld: 90 mg/dL (ref 65–99)
POTASSIUM: 4 mmol/L (ref 3.5–5.1)
Sodium: 135 mmol/L (ref 135–145)
TOTAL PROTEIN: 6.5 g/dL (ref 6.5–8.1)

## 2014-10-27 LAB — CBC
HEMATOCRIT: 38.4 % — AB (ref 40.0–52.0)
Hemoglobin: 12.5 g/dL — ABNORMAL LOW (ref 13.0–18.0)
MCH: 30.1 pg (ref 26.0–34.0)
MCHC: 32.5 g/dL (ref 32.0–36.0)
MCV: 92.6 fL (ref 80.0–100.0)
Platelets: 220 10*3/uL (ref 150–440)
RBC: 4.15 MIL/uL — ABNORMAL LOW (ref 4.40–5.90)
RDW: 14 % (ref 11.5–14.5)
WBC: 13.7 10*3/uL — ABNORMAL HIGH (ref 3.8–10.6)

## 2014-10-27 MED ORDER — ROPINIROLE HCL 0.25 MG PO TABS
0.2500 mg | ORAL_TABLET | Freq: Every day | ORAL | Status: DC
  Administered 2014-10-27 – 2014-10-29 (×3): 0.25 mg via ORAL
  Filled 2014-10-27 (×3): qty 1

## 2014-10-27 MED ORDER — FLUTICASONE PROPIONATE 50 MCG/ACT NA SUSP
1.0000 | Freq: Every day | NASAL | Status: DC
  Administered 2014-10-28 – 2014-10-30 (×3): 1 via NASAL
  Filled 2014-10-27 (×2): qty 16

## 2014-10-27 MED ORDER — ONDANSETRON HCL 4 MG/2ML IJ SOLN: 4.0000 mg | Freq: Four times a day (QID) | INTRAMUSCULAR | Status: DC | PRN

## 2014-10-27 MED ORDER — LORATADINE 10 MG PO TABS
10.0000 mg | ORAL_TABLET | Freq: Every day | ORAL | Status: DC
  Administered 2014-10-28 – 2014-10-30 (×3): 10 mg via ORAL
  Filled 2014-10-27 (×3): qty 1

## 2014-10-27 MED ORDER — ONDANSETRON HCL 4 MG PO TABS: 4.0000 mg | ORAL_TABLET | Freq: Four times a day (QID) | ORAL | Status: DC | PRN

## 2014-10-27 MED ORDER — IOHEXOL 300 MG/ML  SOLN
100.0000 mL | Freq: Once | INTRAMUSCULAR | Status: AC | PRN
  Administered 2014-10-27: 100 mL via INTRAVENOUS
  Filled 2014-10-27: qty 100

## 2014-10-27 MED ORDER — PANTOPRAZOLE SODIUM 40 MG PO TBEC
40.0000 mg | DELAYED_RELEASE_TABLET | Freq: Every day | ORAL | Status: DC
  Administered 2014-10-28 – 2014-10-30 (×3): 40 mg via ORAL
  Filled 2014-10-27 (×3): qty 1

## 2014-10-27 MED ORDER — ZOLPIDEM TARTRATE 5 MG PO TABS: 5.0000 mg | ORAL_TABLET | Freq: Every evening | ORAL | Status: DC | PRN

## 2014-10-27 MED ORDER — DOCUSATE SODIUM 100 MG PO CAPS
100.0000 mg | ORAL_CAPSULE | Freq: Two times a day (BID) | ORAL | Status: DC
  Administered 2014-10-27 – 2014-10-29 (×6): 100 mg via ORAL
  Filled 2014-10-27 (×7): qty 1

## 2014-10-27 MED ORDER — PROMETHAZINE HCL 25 MG PO TABS
25.0000 mg | ORAL_TABLET | Freq: Four times a day (QID) | ORAL | Status: DC | PRN
Start: 2014-10-27 — End: 2014-10-30

## 2014-10-27 MED ORDER — CLINDAMYCIN PHOSPHATE 600 MG/50ML IV SOLN
600.0000 mg | Freq: Three times a day (TID) | INTRAVENOUS | Status: DC
  Administered 2014-10-27 – 2014-10-30 (×9): 600 mg via INTRAVENOUS
  Filled 2014-10-27 (×13): qty 50

## 2014-10-27 MED ORDER — ACETAMINOPHEN 325 MG PO TABS: 650.0000 mg | ORAL_TABLET | Freq: Four times a day (QID) | ORAL | Status: DC | PRN

## 2014-10-27 MED ORDER — IOHEXOL 350 MG/ML SOLN: 100.0000 mL | Freq: Once | INTRAVENOUS | Status: DC | PRN

## 2014-10-27 MED ORDER — SILDENAFIL CITRATE 100 MG PO TABS: 100.0000 mg | ORAL_TABLET | Freq: Every day | ORAL | Status: DC | PRN

## 2014-10-27 MED ORDER — CYCLOBENZAPRINE HCL 10 MG PO TABS: 10.0000 mg | ORAL_TABLET | Freq: Three times a day (TID) | ORAL | Status: DC | PRN

## 2014-10-27 MED ORDER — CEFAZOLIN SODIUM 1-5 GM-% IV SOLN
1.0000 g | Freq: Once | INTRAVENOUS | Status: AC
  Administered 2014-10-27: 1 g via INTRAVENOUS
  Filled 2014-10-27: qty 50

## 2014-10-27 MED ORDER — SODIUM CHLORIDE 0.9 % IV SOLN
INTRAVENOUS | Status: DC
  Administered 2014-10-27 – 2014-10-30 (×7): via INTRAVENOUS

## 2014-10-27 MED ORDER — GABAPENTIN 300 MG PO CAPS
600.0000 mg | ORAL_CAPSULE | Freq: Three times a day (TID) | ORAL | Status: DC
  Administered 2014-10-27 – 2014-10-30 (×9): 600 mg via ORAL
  Filled 2014-10-27 (×9): qty 2

## 2014-10-27 MED ORDER — ROSUVASTATIN CALCIUM 20 MG PO TABS
20.0000 mg | ORAL_TABLET | Freq: Every day | ORAL | Status: DC
  Administered 2014-10-27 – 2014-10-29 (×3): 20 mg via ORAL
  Filled 2014-10-27 (×3): qty 1

## 2014-10-27 MED ORDER — TIZANIDINE HCL 4 MG PO TABS
2.0000 mg | ORAL_TABLET | Freq: Every day | ORAL | Status: DC
  Administered 2014-10-27 – 2014-10-29 (×3): 2 mg via ORAL
  Filled 2014-10-27 (×3): qty 1

## 2014-10-27 MED ORDER — HYDROCODONE-ACETAMINOPHEN 10-325 MG PO TABS
1.0000 | ORAL_TABLET | Freq: Three times a day (TID) | ORAL | Status: DC
  Administered 2014-10-27 – 2014-10-30 (×8): 1 via ORAL
  Filled 2014-10-27 (×9): qty 1

## 2014-10-27 MED ORDER — ASPIRIN EC 81 MG PO TBEC
81.0000 mg | DELAYED_RELEASE_TABLET | Freq: Every day | ORAL | Status: DC
  Administered 2014-10-28 – 2014-10-30 (×3): 81 mg via ORAL
  Filled 2014-10-27 (×3): qty 1

## 2014-10-27 MED ORDER — RAMIPRIL 10 MG PO CAPS
10.0000 mg | ORAL_CAPSULE | Freq: Every day | ORAL | Status: DC
Start: 2014-10-27 — End: 2014-10-30
  Administered 2014-10-28 – 2014-10-30 (×3): 10 mg via ORAL
  Filled 2014-10-27 (×4): qty 1

## 2014-10-27 MED ORDER — INFLUENZA VAC SPLIT QUAD 0.5 ML IM SUSY
0.5000 mL | PREFILLED_SYRINGE | INTRAMUSCULAR | Status: AC
  Administered 2014-10-28: 0.5 mL via INTRAMUSCULAR
  Filled 2014-10-27: qty 0.5

## 2014-10-27 MED ORDER — AMLODIPINE BESYLATE 5 MG PO TABS
5.0000 mg | ORAL_TABLET | Freq: Every day | ORAL | Status: DC
Start: 2014-10-27 — End: 2014-10-30
  Administered 2014-10-28 – 2014-10-30 (×3): 5 mg via ORAL
  Filled 2014-10-27 (×3): qty 1

## 2014-10-27 MED ORDER — FERROUS SULFATE 325 (65 FE) MG PO TABS
325.0000 mg | ORAL_TABLET | Freq: Every day | ORAL | Status: DC
  Administered 2014-10-28 – 2014-10-30 (×3): 325 mg via ORAL
  Filled 2014-10-27 (×3): qty 1

## 2014-10-27 MED ORDER — OXYCODONE HCL 5 MG PO TABS
5.0000 mg | ORAL_TABLET | ORAL | Status: DC | PRN
  Administered 2014-10-27 – 2014-10-30 (×8): 5 mg via ORAL
  Filled 2014-10-27 (×8): qty 1

## 2014-10-27 MED ORDER — ACETAMINOPHEN 650 MG RE SUPP: 650.0000 mg | Freq: Four times a day (QID) | RECTAL | Status: DC | PRN

## 2014-10-27 MED ORDER — ENOXAPARIN SODIUM 40 MG/0.4ML ~~LOC~~ SOLN
40.0000 mg | SUBCUTANEOUS | Status: DC
Start: 2014-10-27 — End: 2014-10-30
  Administered 2014-10-27 – 2014-10-29 (×3): 40 mg via SUBCUTANEOUS
  Filled 2014-10-27 (×3): qty 0.4

## 2014-10-27 MED ORDER — BUDESONIDE-FORMOTEROL FUMARATE 80-4.5 MCG/ACT IN AERO
2.0000 | INHALATION_SPRAY | Freq: Two times a day (BID) | RESPIRATORY_TRACT | Status: DC
  Administered 2014-10-27 – 2014-10-30 (×6): 2 via RESPIRATORY_TRACT
  Filled 2014-10-27: qty 6.9

## 2014-10-27 NOTE — ED Provider Notes (Signed)
[facility] Emergency Department Provider Note  ____________________________________________  Time seen: On arrival  I have reviewed the triage vital signs and the nursing notes.   HISTORY  Chief Complaint Leg Pain    HPI Gerald Jefferson is a 59 y.o. male who presents with complaints of right anterior thigh pain. Patient reports he is injecting testosterone because he has low testosterone. He last injected 4 days ago. 3 days ago the area started to become uncomfortableand has worsened since then and is now severe and burning in nature. No numbness no pallor or tingling no fevers. Pain is worse with walking     Past Medical History  Diagnosis Date  . Pulmonary embolism   . Anemia 2009    while he was on Warfarin, Aspirin and Plavix. Resolved.   . Blood in stool   . Coronary artery disease   . Hyperlipidemia   . Essential hypertension   . ED (erectile dysfunction)   . Asthma     Patient Active Problem List   Diagnosis Date Noted  . Hip pain 03/12/2012  . Coronary artery disease   . Hyperlipidemia   . Essential hypertension   . ED (erectile dysfunction)     Past Surgical History  Procedure Laterality Date  . Total hip arthroplasty    . Coronary angioplasty with stent placement  04/2006    Cypher DES to LAD and OM1  . Cardiac catheterization  07/2007    Patent LAD/OM stents. Stable 40% stenosis in proximal RCA    Current Outpatient Rx  Name  Route  Sig  Dispense  Refill  . amLODipine (NORVASC) 5 MG tablet   Oral   Take 1 tablet (5 mg total) by mouth daily.   30 tablet   6   . aspirin 81 MG tablet   Oral   Take 81 mg by mouth daily.         . cyclobenzaprine (FLEXERIL) 10 MG tablet   Oral   Take 1 tablet by mouth Twice daily.         . ferrous sulfate 325 (65 FE) MG tablet   Oral   Take 325 mg by mouth daily with breakfast.         . fexofenadine (ALLEGRA) 180 MG tablet   Oral   Take 180 mg by mouth daily.          . fluticasone (FLONASE) 50 MCG/ACT nasal spray   Each Nare   Place 1 spray into both nostrils daily.         Marland Kitchen gabapentin (NEURONTIN) 300 MG capsule   Oral   Take 1 capsule by mouth Twice daily.         Marland Kitchen HYDROcodone-acetaminophen (NORCO) 10-325 MG per tablet   Oral   Take 1 tablet by mouth 3 (three) times daily.         Marland Kitchen omeprazole (PRILOSEC) 20 MG capsule   Oral   Take 1 capsule (20 mg total) by mouth daily.   30 capsule   6   . promethazine (PHENERGAN) 25 MG tablet   Oral   Take 25 mg by mouth every 6 (six) hours as needed for nausea or vomiting.         . ramipril (ALTACE) 10 MG capsule   Oral   Take 1 capsule (10 mg total) by mouth daily.   30 capsule   11   . rosuvastatin (CRESTOR) 20 MG tablet   Oral   Take 20  mg by mouth daily.         . tadalafil (CIALIS) 5 MG tablet   Oral   Take 5 mg by mouth daily as needed for erectile dysfunction.         Marland Kitchen tiZANidine (ZANAFLEX) 2 MG tablet   Oral   Take 2 mg by mouth at bedtime.             Allergies Review of patient's allergies indicates no known allergies.  Family History  Problem Relation Age of Onset  . Hypertension Mother     Social History Social History  Substance Use Topics  . Smoking status: Former Smoker -- 1.50 packs/day for 30 years    Types: Cigarettes    Quit date: 04/08/2006  . Smokeless tobacco: Never Used  . Alcohol Use: No    Review of Systems  Constitutional: Negative for fever. Eyes: Negative for visual changes. ENT: Negative for sore throat Cardiovascular: Negative for chest pain. Respiratory: Negative for shortness of breath. Gastrointestinal: Negative for abdominal pain, vomiting and diarrhea. Genitourinary: Negative for dysuria. Musculoskeletal: Negative for back pain. Right thigh pain Skin: Thigh redness Neurological: Negative for headaches or focal weakness Psychiatric: Anxiety    ____________________________________________   PHYSICAL  EXAM:  VITAL SIGNS: ED Triage Vitals  Enc Vitals Group     BP 10/27/14 1036 124/85 mmHg     Pulse Rate 10/27/14 1036 80     Resp 10/27/14 1036 20     Temp 10/27/14 1036 98.2 F (36.8 C)     Temp Source 10/27/14 1036 Oral     SpO2 10/27/14 1036 100 %     Weight 10/27/14 1036 160 lb (72.576 kg)     Height 10/27/14 1036 6' (1.829 m)     Head Cir --      Peak Flow --      Pain Score 10/27/14 1036 7     Pain Loc --      Pain Edu? --      Excl. in [place]? --      Constitutional: Alert and oriented. Well appearing and in no distress. Eyes: Conjunctivae are normal.  ENT   Head: Normocephalic and atraumatic.   Mouth/Throat: Mucous membranes are moist. Cardiovascular: Normal rate, regular rhythm. Normal and symmetric distal pulses are present in all extremities. No murmurs, rubs, or gallops. Respiratory: Normal respiratory effort without tachypnea nor retractions. Breath sounds are clear and equal bilaterally.  Gastrointestinal: Soft and non-tender in all quadrants. No distention. There is no CVA tenderness. Genitourinary: deferred Musculoskeletal: Nontender with normal range of motion in all extremities. No lower extremity tenderness nor edema. Neurologic:  Normal speech and language. No gross focal neurologic deficits are appreciated. Skin:  Skin is warm, dry and intact. Mild erythema to the right anterior thigh with some tenderness to palpation. Some swelling noted. Extremities warm and well perfused. No pallor. No significant pain with extension or flexion. While there is some swelling the compartment overall soft Psychiatric: Mood and affect are normal. Patient exhibits appropriate insight and judgment.  ____________________________________________    LABS (pertinent positives/negatives)  Labs Reviewed  CBC  COMPREHENSIVE METABOLIC PANEL    ____________________________________________   EKG  None  ____________________________________________    RADIOLOGY I have  personally reviewed any xrays that were ordered on this patient: CT shows myofascitis, no discernible abscess  ____________________________________________   PROCEDURES  Procedure(s) performed: none  Critical Care performed: none  ____________________________________________   INITIAL IMPRESSION / ASSESSMENT AND PLAN / ED COURSE  Pertinent labs & imaging results that were available during my care of the patient were reviewed by me and considered in my medical decision making (see chart for details).  Patient with likely soft tissue infection secondary to testosterone injection. We will check labs and imaging if necessary  Patient with elevated white blood cell count, CT shows myofasciitis and cellulitis. I will admit the patient for IV antibiotics. Patient was given Ancef by me originally as I thought this was simply a cellulitis.  ____________________________________________   FINAL CLINICAL IMPRESSION(S) / ED DIAGNOSES  Final diagnoses:  Infection  Myofascitis  Cellulitis of right lower extremity     Lavonia Drafts, MD 10/27/14 1408

## 2014-10-27 NOTE — Consult Note (Signed)
Patient ID: Gerald Jefferson, male   DOB: 1955-02-18, 59 y.o.   MRN: [id number] CC: Right thigh pain HPI Gerald Jefferson is a 59 y.o. male with a three-day history of right thigh pain. Patient states that last Thursday he injected his right thigh with his weekly testosterone shot. One day after that he started having pain to his right thigh that has progressively worsened every day since then. He noticed swelling to his right thigh and then gradual decrease in ability to place pressure on his right leg while walking. He denies any fevers, chills, nausea, vomiting, chest pain, shortness of breath, diarrhea, constipation. He states prior to this he was in his usual state of health and has never had anything like this before.  HPI  Past Medical History  Diagnosis Date  . Pulmonary embolism   . Anemia 2009    while he was on Warfarin, Aspirin and Plavix. Resolved.   . Blood in stool   . Coronary artery disease   . Hyperlipidemia   . Essential hypertension   . ED (erectile dysfunction)   . Asthma     Past Surgical History  Procedure Laterality Date  . Total hip arthroplasty    . Coronary angioplasty with stent placement  04/2006    Cypher DES to LAD and OM1  . Cardiac catheterization  07/2007    Patent LAD/OM stents. Stable 40% stenosis in proximal RCA    Family History  Problem Relation Age of Onset  . Hypertension Mother     Social History Social History  Substance Use Topics  . Smoking status: Former Smoker -- 1.50 packs/day for 30 years    Types: Cigarettes    Quit date: 04/08/2006  . Smokeless tobacco: Never Used  . Alcohol Use: No    No Known Allergies  Current Facility-Administered Medications  Medication Dose Route Frequency Provider Last Rate Last Dose  . 0.9 %  sodium chloride infusion   Intravenous Continuous Theodoro Grist, MD 125 mL/hr at 10/27/14 1526    . acetaminophen (TYLENOL) tablet 650 mg  650 mg Oral Q6H PRN Theodoro Grist, MD       Or  .  acetaminophen (TYLENOL) suppository 650 mg  650 mg Rectal Q6H PRN Theodoro Grist, MD      . amLODipine (NORVASC) tablet 5 mg  5 mg Oral Daily Theodoro Grist, MD   5 mg at 10/27/14 1658  . aspirin EC tablet 81 mg  81 mg Oral Daily Theodoro Grist, MD   81 mg at 10/27/14 1659  . budesonide-formoterol (SYMBICORT) 80-4.5 MCG/ACT inhaler 2 puff  2 puff Inhalation BID Theodoro Grist, MD      . clindamycin (CLEOCIN) IVPB 600 mg  600 mg Intravenous 3 times per day Theodoro Grist, MD   600 mg at 10/27/14 1526  . cyclobenzaprine (FLEXERIL) tablet 10 mg  10 mg Oral TID PRN Theodoro Grist, MD      . docusate sodium (COLACE) capsule 100 mg  100 mg Oral BID Theodoro Grist, MD   100 mg at 10/27/14 1526  . enoxaparin (LOVENOX) injection 40 mg  40 mg Subcutaneous Q24H Theodoro Grist, MD   40 mg at 10/27/14 1526  . ferrous sulfate tablet 325 mg  325 mg Oral Daily Theodoro Grist, MD   325 mg at 10/27/14 1659  . fluticasone (FLONASE) 50 MCG/ACT nasal spray 1 spray  1 spray Each Nare Daily Theodoro Grist, MD   1 spray at 10/27/14 1659  . gabapentin (NEURONTIN) capsule 600  mg  600 mg Oral TID Theodoro Grist, MD   600 mg at 10/27/14 1704  . HYDROcodone-acetaminophen (NORCO) 10-325 MG per tablet 1 tablet  1 tablet Oral TID Theodoro Grist, MD   1 tablet at 10/27/14 1700  . [START ON 10/28/2014] Influenza vac split quadrivalent PF (FLUARIX) injection 0.5 mL  0.5 mL Intramuscular Tomorrow-1000 Theodoro Grist, MD      . loratadine (CLARITIN) tablet 10 mg  10 mg Oral Daily Theodoro Grist, MD   10 mg at 10/27/14 1700  . ondansetron (ZOFRAN) tablet 4 mg  4 mg Oral Q6H PRN Theodoro Grist, MD       Or  . ondansetron (ZOFRAN) injection 4 mg  4 mg Intravenous Q6H PRN Theodoro Grist, MD      . oxyCODONE (Oxy IR/ROXICODONE) immediate release tablet 5 mg  5 mg Oral Q4H PRN Theodoro Grist, MD   5 mg at 10/27/14 1526  . pantoprazole (PROTONIX) EC tablet 40 mg  40 mg Oral Daily Theodoro Grist, MD   40 mg at 10/27/14 1700  . promethazine (PHENERGAN) tablet  25 mg  25 mg Oral Q6H PRN Theodoro Grist, MD      . ramipril (ALTACE) capsule 10 mg  10 mg Oral Daily Theodoro Grist, MD   10 mg at 10/27/14 1700  . rOPINIRole (REQUIP) tablet 0.25 mg  0.25 mg Oral QHS Theodoro Grist, MD      . rosuvastatin (CRESTOR) tablet 20 mg  20 mg Oral QHS Theodoro Grist, MD      . tiZANidine (ZANAFLEX) tablet 2 mg  2 mg Oral QHS Theodoro Grist, MD      . zolpidem (AMBIEN) tablet 5 mg  5 mg Oral QHS PRN,MR X 1 Theodoro Grist, MD         Review of Systems A 10 point review of systems was asked and was negative except for the findings documented in the history of present illness  Physical Exam Blood pressure 118/64, pulse 91, temperature 98.3 F (36.8 C), temperature source Oral, resp. rate 20, height 6' (1.829 m), weight 72.576 kg (160 lb), SpO2 98 %. CONSTITUTIONAL: No acute distress. EYES: Pupils are equal, round, and reactive to light, Sclera are non-icteric. EARS, NOSE, MOUTH AND THROAT: The oropharynx is clear. The oral mucosa is pink and moist. Hearing is intact to voice. LYMPH NODES:  Lymph nodes in the neck are normal. RESPIRATORY:  Lungs are clear. There is normal respiratory effort, with equal breath sounds bilaterally, and without pathologic use of accessory muscles. CARDIOVASCULAR: Heart is regular without murmurs, gallops, or rubs. GI: The abdomen is  soft, nontender, and nondistended. There are no palpable masses. There is no hepatosplenomegaly. There are normal bowel sounds in all quadrants. GU: Rectal deferred.   MUSCULOSKELETAL: Normal muscle strength and tone. No cyanosis or edema. Right thigh with obvious tenderness and swelling to the anterior thigh. This tenderness does not extend the entirety of the anterior compartment. There is mild erythema to the area.   SKIN: Turgor is good and there are no pathologic skin lesions or ulcers. NEUROLOGIC: Motor and sensation is grossly normal. Cranial nerves are grossly intact. PSYCH:  Oriented to person, place and  time. Affect is normal.  Data Reviewed White blood cell count elevated at 13.7. CT scan of the area consistent with possible fasciitis without evidence of abscess. I have personally reviewed the patient's imaging, laboratory findings and medical records.    Assessment    Right thigh pain    Plan  59 year old male with right thigh pain thought to be secondary to mild fasciitis. Imaging and exam reassuring in that the pain does not extend beyond the visible and palpable area of induration. There is no palpable area of fluctuance nor visible abscess on CT scan. Agree with and recommend continued medical management with clindamycin. This is most likely a skin bacteria caused infection with Staphylococcus or Streptococcus. Will follow closely and should his exam worsen or he develop signs of abscess requiring drainage within recommended surgical intervention. We'll follow with you.     Time spent with the patient was 30 minutes, with more than 50% of the time spent in face-to-face education, counseling and care coordination.     Clayburn Pert 10/27/2014, 5:48 PM

## 2014-10-27 NOTE — ED Notes (Signed)
Pt to ed with c/o right leg pain since Thursday of last week.  Pt states he self-injected a testosterone shot on Thursday and then pain and swelling.

## 2014-10-27 NOTE — Plan of Care (Addendum)
Problem: Discharge Progression Outcomes Goal: Other Discharge Outcomes/Goals Outcome: Progressing Plan of care progress to goal: C/o pain in right upper leg, oxycodone given PRN. Pt stated relief. Afebrile. IV ABX, IVF infusing.  Wife at the bedside.  Pt has no appetite issues. Ate well for dinner. Independent with needs.

## 2014-10-27 NOTE — ED Notes (Signed)
presents with right leg pain after injecting self with testerone at home   Leg is now swollen and painful .unable to ambulate w/o limp

## 2014-10-27 NOTE — H&P (Addendum)
[facility] [place] NAME: Gerald Jefferson    MR#:  [id number]  DATE OF BIRTH:  1955-07-11  DATE OF ADMISSION:  10/27/2014  PRIMARY CARE PHYSICIAN: Volanda Napoleon, MD   REQUESTING/REFERRING PHYSICIAN:   CHIEF COMPLAINT:   Chief Complaint  Patient presents with  . Leg Pain    HISTORY OF PRESENT ILLNESS: Ismail Graziani  is a 59 y.o. male with a known history of coronary artery disease, hyperlipidemia, hypertension, presents to the hospital with complaints of right thigh pain. I wonder patient, he was diagnosed with low T syndrome and was prescribed intramuscular testosterone injections. He injected his last testosterone on Thursday, 10/23/2014 to anterior aspect of his right thigh, and over the next few days he noticed the leg becoming more and more swollen, painful and red. He tells me that his pain was as severe, 7 out of 10 by intensity, constant, increasing whenever he walks or puts weight on it , eases off with elevation of the leg. He presented to the hospital for further evaluation where he was noted to have leukocytosis on lab studies. CT scan of his right femur revealed swelling, edema and fluid collection in the anterior compartment of the musculature without obvious abscess,  consistent with myofasciitis. Hospitalist services were contacted for admission  PAST MEDICAL HISTORY:   Past Medical History  Diagnosis Date  . Pulmonary embolism   . Anemia 2009    while he was on Warfarin, Aspirin and Plavix. Resolved.   . Blood in stool   . Coronary artery disease   . Hyperlipidemia   . Essential hypertension   . ED (erectile dysfunction)   . Asthma     PAST SURGICAL HISTORY:  Past Surgical History  Procedure Laterality Date  . Total hip arthroplasty    . Coronary angioplasty with stent placement  04/2006    Cypher DES to LAD and OM1  . Cardiac catheterization  07/2007    Patent LAD/OM stents. Stable 40% stenosis in  proximal RCA    SOCIAL HISTORY:  Social History  Substance Use Topics  . Smoking status: Former Smoker -- 1.50 packs/day for 30 years    Types: Cigarettes    Quit date: 04/08/2006  . Smokeless tobacco: Never Used  . Alcohol Use: No    FAMILY HISTORY:  Family History  Problem Relation Age of Onset  . Hypertension Mother     DRUG ALLERGIES: No Known Allergies  Review of Systems  Constitutional: Positive for malaise/fatigue. Negative for fever, chills and weight loss.  HENT: Negative for congestion.   Eyes: Negative for blurred vision and double vision.  Respiratory: Negative for cough, sputum production, shortness of breath and wheezing.   Cardiovascular: Negative for chest pain, palpitations, orthopnea, leg swelling and PND.  Gastrointestinal: Positive for nausea. Negative for vomiting, abdominal pain, diarrhea, constipation, blood in stool and melena.  Genitourinary: Negative for dysuria, urgency, frequency and hematuria.  Musculoskeletal: Negative for falls.  Skin: Negative for rash.  Neurological: Negative for dizziness and weakness.  Psychiatric/Behavioral: Negative for depression and memory loss. The patient is not nervous/anxious.     MEDICATIONS AT HOME:  Prior to Admission medications   Medication Sig Start Date End Date Taking? Authorizing Provider  amLODipine (NORVASC) 5 MG tablet Take 1 tablet (5 mg total) by mouth daily. 08/29/11  Yes Wellington Hampshire, MD  aspirin EC 81 MG tablet Take 81 mg by mouth daily.   Yes Historical Provider, MD  budesonide-formoterol (SYMBICORT) 80-4.5 MCG/ACT inhaler Inhale 2 puffs into the lungs 2 (two) times daily.   Yes Historical Provider, MD  cyclobenzaprine (FLEXERIL) 10 MG tablet Take 1 tablet by mouth 3 (three) times daily as needed for muscle spasms.    Yes Historical Provider, MD  ferrous sulfate 325 (65 FE) MG tablet Take 325 mg by mouth daily.    Yes Historical Provider, MD  fexofenadine (ALLEGRA) 180 MG tablet Take 180 mg by  mouth daily.   Yes Historical Provider, MD  fluticasone (FLONASE) 50 MCG/ACT nasal spray Place 1 spray into both nostrils daily.   Yes Historical Provider, MD  gabapentin (NEURONTIN) 600 MG tablet Take 600 mg by mouth 3 (three) times daily.   Yes Historical Provider, MD  HYDROcodone-acetaminophen (NORCO) 10-325 MG per tablet Take 1 tablet by mouth 3 (three) times daily.   Yes Historical Provider, MD  omeprazole (PRILOSEC) 20 MG capsule Take 1 capsule (20 mg total) by mouth daily. 08/29/11  Yes Wellington Hampshire, MD  promethazine (PHENERGAN) 25 MG tablet Take 25 mg by mouth every 6 (six) hours as needed for nausea or vomiting.   Yes Historical Provider, MD  ramipril (ALTACE) 10 MG capsule Take 1 capsule (10 mg total) by mouth daily. 05/27/14  Yes Wellington Hampshire, MD  rOPINIRole (REQUIP) 0.25 MG tablet Take 0.25 mg by mouth at bedtime.   Yes Historical Provider, MD  rosuvastatin (CRESTOR) 20 MG tablet Take 20 mg by mouth at bedtime.    Yes Historical Provider, MD  sildenafil (VIAGRA) 100 MG tablet Take 100 mg by mouth daily as needed for erectile dysfunction.   Yes Historical Provider, MD  tiZANidine (ZANAFLEX) 2 MG tablet Take 2 mg by mouth at bedtime.     Yes Historical Provider, MD      PHYSICAL EXAMINATION:   VITAL SIGNS: Blood pressure 124/85, pulse 80, temperature 98.2 F (36.8 C), temperature source Oral, resp. rate 20, height 6' (1.829 m), weight 72.576 kg (160 lb), SpO2 100 %.  GENERAL:  59 y.o.-year-old patient lying in the bed with no acute distress.  EYES: Pupils equal, round, reactive to light and accommodation. No scleral icterus. Extraocular muscles intact.  HEENT: Head atraumatic, normocephalic. Oropharynx and nasopharynx clear.  NECK:  Supple, no jugular venous distention. No thyroid enlargement, no tenderness.  LUNGS: Normal breath sounds bilaterally, no wheezing, rales,rhonchi or crepitation. No use of accessory muscles of respiration.  CARDIOVASCULAR: S1, S2 normal. No  murmurs, rubs, or gallops.  ABDOMEN: Soft, nontender, nondistended. Bowel sounds present. No organomegaly or mass.  EXTREMITIES: No pedal edema, cyanosis, or clubbing. Right lower extremity as anterior thigh skin is tight,  Shiny,  red, increased warmth  was noted as well as significant tenderness and firmness of subcutaneous tissues/muscles but no crepitations NEUROLOGIC: Cranial nerves II through XII are intact. Muscle strength 5/5 in all extremities. Sensation intact. Gait not checked.  PSYCHIATRIC: The patient is alert and oriented x 3.  SKIN: No obvious rash, lesion, or ulcer.   LABORATORY PANEL:   CBC  Recent Labs Lab 10/27/14 1123  WBC 13.7*  HGB 12.5*  HCT 38.4*  PLT 220  MCV 92.6  MCH 30.1  MCHC 32.5  RDW 14.0   ------------------------------------------------------------------------------------------------------------------  Chemistries   Recent Labs Lab 10/27/14 1123  NA 135  K 4.0  CL 103  CO2 25  GLUCOSE 90  BUN 20  CREATININE 1.30*  CALCIUM 8.2*  AST 18  ALT 11*  ALKPHOS 41  BILITOT 0.7   ------------------------------------------------------------------------------------------------------------------  Cardiac Enzymes No results for input(s): TROPONINI in the last 168 hours. ------------------------------------------------------------------------------------------------------------------  RADIOLOGY: Ct Femur Right W Contrast  10/27/2014   CLINICAL DATA:  Right thigh pain and swelling since last Thursday. Possibly related to testosterone injections.  EXAM: CT OF THE LOWER RIGHT EXTREMITY WITH CONTRAST  TECHNIQUE: Multidetector CT imaging of the right thigh was performed according to the standard protocol following intravenous contrast administration.  COMPARISON:  None.  CONTRAST:  179mL OMNIPAQUE IOHEXOL 300 MG/ML  SOLN  FINDINGS: There is diffuse subcutaneous soft tissue swelling/ edema involving the anterior aspect of the right thigh all the way down  to the knee. This is most likely cellulitis. This also marked inflammation, swelling, edema and Back Scholl fluid in the anterior compartment of the thigh. I do not see a discrete rim enhancing soft tissue abscess but this would be much better assessed with MRI. No hip or knee joint effusion. The bony structures are intact. No destructive bony changes to suggest osteomyelitis. No inguinal mass or adenopathy. No significant intrapelvic abnormalities. A right hip prosthesis is noted.  Moderate scattered atherosclerotic calcifications noted. The venous structures appear patent.  IMPRESSION: Diffuse and fairly marked soft tissue swelling/edema/ fluid involving the anterior compartment musculature without discrete rim enhancing abscess. This is consistent with myofasciitis. This is most notable in the rectus femoris muscle with fluid and a small amount of air anterior to the muscle in the distal thigh.  Diffuse cellulitis.  No findings for septic arthritis or osteomyelitis.  If symptoms persist or worsen MRI is recommended for further evaluation.   Electronically Signed   By: Marijo Sanes M.D.   On: 10/27/2014 13:53    EKG: Orders placed or performed in visit on 05/27/14  . EKG 12-Lead    IMPRESSION AND PLAN:  Principal Problem:   Myofasciitis Active Problems:   Myositis 1. Acute right lower extremity myofasciitis, patient medical floor. Initiate him on clindamycin IV, get surgical consultation 2. Renal insufficiency. Initiate patient on IV fluids 3. Leukocytosis. Follow with therapy 4. Anemia. Guaiac   All the records are reviewed and case discussed with ED provider. Management plans discussed with the patient, family and they are in agreement.  CODE STATUS:  Full code  TOTAL TIME TAKING CARE OF THIS PATIENT: 55 minutes.    Theodoro Grist M.D on 10/27/2014 at 2:24 PM  Between 7am to 6pm - Pager - [phone] After 6pm go to [email or web address] - [email or web address]  Office  [phone]  CC: Primary care physician; Volanda Napoleon, MD

## 2014-10-28 LAB — VITAMIN B12: Vitamin B-12: 200 pg/mL (ref 180–914)

## 2014-10-28 LAB — BASIC METABOLIC PANEL
Anion gap: 5 (ref 5–15)
BUN: 15 mg/dL (ref 6–20)
CO2: 26 mmol/L (ref 22–32)
CREATININE: 1.05 mg/dL (ref 0.61–1.24)
Calcium: 7.9 mg/dL — ABNORMAL LOW (ref 8.9–10.3)
Chloride: 108 mmol/L (ref 101–111)
GFR calc Af Amer: >60 mL/min (ref >60)
GLUCOSE: 110 mg/dL — AB (ref 65–99)
POTASSIUM: 4.2 mmol/L (ref 3.5–5.1)
Sodium: 139 mmol/L (ref 135–145)

## 2014-10-28 LAB — RETICULOCYTES
RBC.: 3.97 MIL/uL — AB (ref 4.40–5.90)
RETIC COUNT ABSOLUTE: 79.4 10*3/uL (ref 19.0–183.0)
Retic Ct Pct: 2 % (ref 0.4–3.1)

## 2014-10-28 LAB — IRON AND TIBC
Iron: 18 ug/dL — ABNORMAL LOW (ref 45–182)
Saturation Ratios: 9 % — ABNORMAL LOW (ref 17.9–39.5)
TIBC: 213 ug/dL — ABNORMAL LOW (ref 250–450)
UIBC: 195 ug/dL

## 2014-10-28 LAB — FOLATE: FOLATE: 9.2 ng/mL (ref >5.9)

## 2014-10-28 LAB — CBC
HEMATOCRIT: 36.4 % — AB (ref 40.0–52.0)
Hemoglobin: 12 g/dL — ABNORMAL LOW (ref 13.0–18.0)
MCH: 30.7 pg (ref 26.0–34.0)
MCHC: 33 g/dL (ref 32.0–36.0)
MCV: 92.8 fL (ref 80.0–100.0)
Platelets: 233 10*3/uL (ref 150–440)
RBC: 3.92 MIL/uL — ABNORMAL LOW (ref 4.40–5.90)
RDW: 14 % (ref 11.5–14.5)
WBC: 10.6 10*3/uL (ref 3.8–10.6)

## 2014-10-28 LAB — CK: Total CK: 46 U/L — ABNORMAL LOW (ref 49–397)

## 2014-10-28 LAB — FERRITIN: FERRITIN: 292 ng/mL (ref 24–336)

## 2014-10-28 NOTE — Progress Notes (Signed)
[facility] at [place] NAME: Gerald Jefferson    MR#:  [id number]  DATE OF BIRTH:  1955-04-30  SUBJECTIVE:  CHIEF COMPLAINT:   Chief Complaint  Patient presents with  . Leg Pain   Right thigh pain and swelling slightly decreased. Still tender to touch. Reluctant to put weight on this leg.  REVIEW OF SYSTEMS:   Review of Systems  Constitutional: Negative for fever.  Respiratory: Negative for shortness of breath.   Cardiovascular: Positive for leg swelling. Negative for chest pain and palpitations.  Gastrointestinal: Negative for nausea, vomiting and abdominal pain.  Genitourinary: Negative for dysuria.  Musculoskeletal: Positive for myalgias.    DRUG ALLERGIES:  No Known Allergies  VITALS:  Blood pressure 118/68, pulse 85, temperature 98.4 F (36.9 C), temperature source Oral, resp. rate 20, height 6' (1.829 m), weight 72.576 kg (160 lb), SpO2 98 %.  PHYSICAL EXAMINATION:  GENERAL:  59 y.o.-year-old patient lying in the bed with no acute distress.  NECK:  Supple, no jugular venous distention. No thyroid enlargement, no tenderness.  LUNGS: Normal breath sounds bilaterally, no wheezing, rales,rhonchi or crepitation. No use of accessory muscles of respiration.  CARDIOVASCULAR: S1, S2 normal. No murmurs, rubs, or gallops.  ABDOMEN: Soft, nontender, nondistended. Bowel sounds present. No organomegaly or mass.  EXTREMITIES: Right thigh swollen, tender to touch, no skin discoloration no focal area of pain, no fluctuance. The injection site is not obvious NEUROLOGIC: Cranial nerves II through XII are intact. Muscle strength 5/5 in all extremities. Sensation intact. Gait not checked.  PSYCHIATRIC: The patient is alert and oriented x 3.  SKIN: No obvious rash, lesion, or ulcer.   LABORATORY PANEL:   CBC  Recent Labs Lab 10/28/14 0558  WBC 10.6  HGB 12.0*  HCT 36.4*  PLT 233    ------------------------------------------------------------------------------------------------------------------  Chemistries   Recent Labs Lab 10/27/14 1123 10/28/14 0558  NA 135 139  K 4.0 4.2  CL 103 108  CO2 25 26  GLUCOSE 90 110*  BUN 20 15  CREATININE 1.30* 1.05  CALCIUM 8.2* 7.9*  AST 18  --   ALT 11*  --   ALKPHOS 41  --   BILITOT 0.7  --    ------------------------------------------------------------------------------------------------------------------  Cardiac Enzymes No results for input(s): TROPONINI in the last 168 hours. ------------------------------------------------------------------------------------------------------------------  RADIOLOGY:  Ct Femur Right W Contrast  10/27/2014   CLINICAL DATA:  Right thigh pain and swelling since last Thursday. Possibly related to testosterone injections.  EXAM: CT OF THE LOWER RIGHT EXTREMITY WITH CONTRAST  TECHNIQUE: Multidetector CT imaging of the right thigh was performed according to the standard protocol following intravenous contrast administration.  COMPARISON:  None.  CONTRAST:  118mL OMNIPAQUE IOHEXOL 300 MG/ML  SOLN  FINDINGS: There is diffuse subcutaneous soft tissue swelling/ edema involving the anterior aspect of the right thigh all the way down to the knee. This is most likely cellulitis. This also marked inflammation, swelling, edema and Back Scholl fluid in the anterior compartment of the thigh. I do not see a discrete rim enhancing soft tissue abscess but this would be much better assessed with MRI. No hip or knee joint effusion. The bony structures are intact. No destructive bony changes to suggest osteomyelitis. No inguinal mass or adenopathy. No significant intrapelvic abnormalities. A right hip prosthesis is noted.  Moderate scattered atherosclerotic calcifications noted. The venous structures appear patent.  IMPRESSION: Diffuse and fairly marked soft tissue swelling/edema/ fluid involving the anterior  compartment musculature  without discrete rim enhancing abscess. This is consistent with myofasciitis. This is most notable in the rectus femoris muscle with fluid and a small amount of air anterior to the muscle in the distal thigh.  Diffuse cellulitis.  No findings for septic arthritis or osteomyelitis.  If symptoms persist or worsen MRI is recommended for further evaluation.   Electronically Signed   By: Marijo Sanes M.D.   On: 10/27/2014 13:53    EKG:   Orders placed or performed in visit on 05/27/14  . EKG 12-Lead    ASSESSMENT AND PLAN:   #1 myofasciitis: Seems to be improving with clindamycin. No cultures obtained on admission. At this point we'll hold off on MRI. Surgery has been consulted and there is no indication for intervention. We'll check a CK level. WBCs decreasing, no fevers  #2 acute kidney injury: Improved with IV fluids. We'll check CK level.  #3 anemia: Mild, check anemia panel  CODE STATUS: Full  TOTAL TIME TAKING CARE OF THIS PATIENT: 30 minutes.  Greater than 50% of time spent in care coordination and counseling. Care plan discussed with the patient and his wife at the bedside. POSSIBLE D/C IN 1-2 DAYS, DEPENDING ON CLINICAL CONDITION.   Myrtis Ser M.D on 10/28/2014 at 11:26 AM  Between 7am to 6pm - Pager - [phone]  After 6pm go to [email or web address] - password [email or web address] [facility]  Office  [phone]  CC: Primary care physician; Volanda Napoleon, MD

## 2014-10-28 NOTE — Progress Notes (Signed)
Subjective:  59 year old male admitted to the medicine service for right thigh cellulitis with possible mild fasciitis. Patient reports feeling approximately the same. Denies any fevers, chills, nausea, vomiting. States his right thigh continues to be tender however it is pain free if he does not move.  Vital signs in last 24 hours: Temp:  [98.2 F (36.8 C)-98.9 F (37.2 C)] 98.4 F (36.9 C) (09/20 0441) Pulse Rate:  [80-99] 85 (09/20 0441) Resp:  [18-20] 20 (09/20 0441) BP: (118-129)/(62-85) 118/68 mmHg (09/20 0441) SpO2:  [98 %-100 %] 98 % (09/20 0441) Weight:  [72.576 kg (160 lb)] 72.576 kg (160 lb) (09/19 1036) Last BM Date: 10/25/14  Intake/Output from previous day: 09/19 0701 - 09/20 0700 In: 240 [P.O.:240] Out: 350 [Urine:350]  Exam:  Gen.: No acute distress Chest: Clear to auscultation regular rhythm Abdomen: Soft, nontender, and nondistended Extremities: Right lower extremity with tenderness to the anterior thigh. This appears to be improved from prior exam with decreased induration and less palpable area of tenderness.  Lab Results:  CBC  Recent Labs  10/27/14 1123 10/28/14 0558  WBC 13.7* 10.6  HGB 12.5* 12.0*  HCT 38.4* 36.4*  PLT 220 233   CMP     Component Value Date/Time   NA 139 10/28/2014 0558   NA 139 10/15/2012 1008   K 4.2 10/28/2014 0558   K 4.3 10/15/2012 1008   CL 108 10/28/2014 0558   CL 108* 10/15/2012 1008   CO2 26 10/28/2014 0558   CO2 29 10/15/2012 1008   GLUCOSE 110* 10/28/2014 0558   GLUCOSE 99 10/15/2012 1008   BUN 15 10/28/2014 0558   BUN 13 10/15/2012 1008   CREATININE 1.05 10/28/2014 0558   CREATININE 1.22 10/15/2012 1008   CALCIUM 7.9* 10/28/2014 0558   CALCIUM 9.3 10/15/2012 1008   PROT 6.5 10/27/2014 1123   PROT 7.1 10/15/2012 1008   ALBUMIN 3.3* 10/27/2014 1123   ALBUMIN 3.5 10/15/2012 1008   AST 18 10/27/2014 1123   AST 14* 10/15/2012 1008   ALT 11* 10/27/2014 1123   ALT 17 10/15/2012 1008   ALKPHOS 41 10/27/2014  1123   ALKPHOS 115 10/15/2012 1008   BILITOT 0.7 10/27/2014 1123   BILITOT 0.6 10/15/2012 1008   GFRNONAA >60 10/28/2014 0558   GFRNONAA >60 10/15/2012 1008   GFRAA >60 10/28/2014 0558   GFRAA >60 10/15/2012 1008   PT/INR No results for input(s): LABPROT, INR in the last 72 hours.  Studies/Results: Ct Femur Right W Contrast  10/27/2014   CLINICAL DATA:  Right thigh pain and swelling since last Thursday. Possibly related to testosterone injections.  EXAM: CT OF THE LOWER RIGHT EXTREMITY WITH CONTRAST  TECHNIQUE: Multidetector CT imaging of the right thigh was performed according to the standard protocol following intravenous contrast administration.  COMPARISON:  None.  CONTRAST:  174mL OMNIPAQUE IOHEXOL 300 MG/ML  SOLN  FINDINGS: There is diffuse subcutaneous soft tissue swelling/ edema involving the anterior aspect of the right thigh all the way down to the knee. This is most likely cellulitis. This also marked inflammation, swelling, edema and Back Scholl fluid in the anterior compartment of the thigh. I do not see a discrete rim enhancing soft tissue abscess but this would be much better assessed with MRI. No hip or knee joint effusion. The bony structures are intact. No destructive bony changes to suggest osteomyelitis. No inguinal mass or adenopathy. No significant intrapelvic abnormalities. A right hip prosthesis is noted.  Moderate scattered atherosclerotic calcifications noted. The venous structures  appear patent.  IMPRESSION: Diffuse and fairly marked soft tissue swelling/edema/ fluid involving the anterior compartment musculature without discrete rim enhancing abscess. This is consistent with myofasciitis. This is most notable in the rectus femoris muscle with fluid and a small amount of air anterior to the muscle in the distal thigh.  Diffuse cellulitis.  No findings for septic arthritis or osteomyelitis.  If symptoms persist or worsen MRI is recommended for further evaluation.    Electronically Signed   By: Marijo Sanes M.D.   On: 10/27/2014 13:53    Assessment/Plan: 59 year old male with right thigh cellulitis and possible mild fasciitis. Improved on exam and with improved white blood cell count. Continue medical management. No indication for acute surgical intervention at this time. We'll continue to follow  Clayburn Pert, MD [place] [facility]

## 2014-10-28 NOTE — Plan of Care (Signed)
Problem: Discharge Progression Outcomes Goal: Other Discharge Outcomes/Goals Plan of care progress to goal: - Pain control via PRN pain meds. - Uses urinal at bediside. - Continues IV fluids. - Continues ABX, Will continue to monitor.

## 2014-10-29 ENCOUNTER — Inpatient Hospital Stay: Payer: BLUE CROSS/BLUE SHIELD

## 2014-10-29 LAB — BASIC METABOLIC PANEL
Anion gap: 5 (ref 5–15)
BUN: 8 mg/dL (ref 6–20)
CALCIUM: 7.6 mg/dL — AB (ref 8.9–10.3)
CO2: 21 mmol/L — ABNORMAL LOW (ref 22–32)
CREATININE: 0.91 mg/dL (ref 0.61–1.24)
Chloride: 113 mmol/L — ABNORMAL HIGH (ref 101–111)
GFR calc Af Amer: >60 mL/min (ref >60)
GLUCOSE: 129 mg/dL — AB (ref 65–99)
POTASSIUM: 4.1 mmol/L (ref 3.5–5.1)
Sodium: 139 mmol/L (ref 135–145)

## 2014-10-29 LAB — CBC
HCT: 34.4 % — ABNORMAL LOW (ref 40.0–52.0)
Hemoglobin: 11.5 g/dL — ABNORMAL LOW (ref 13.0–18.0)
MCH: 31.5 pg (ref 26.0–34.0)
MCHC: 33.5 g/dL (ref 32.0–36.0)
MCV: 94 fL (ref 80.0–100.0)
PLATELETS: 224 10*3/uL (ref 150–440)
RBC: 3.66 MIL/uL — ABNORMAL LOW (ref 4.40–5.90)
RDW: 13.8 % (ref 11.5–14.5)
WBC: 7.1 10*3/uL (ref 3.8–10.6)

## 2014-10-29 MED ORDER — GADOBENATE DIMEGLUMINE 529 MG/ML IV SOLN
15.0000 mL | Freq: Once | INTRAVENOUS | Status: AC | PRN
  Administered 2014-10-29: 15 mL via INTRAVENOUS

## 2014-10-29 MED ORDER — VITAMIN B-12 1000 MCG PO TABS
1000.0000 ug | ORAL_TABLET | Freq: Every day | ORAL | Status: DC
  Administered 2014-10-29 – 2014-10-30 (×2): 1000 ug via ORAL
  Filled 2014-10-29 (×2): qty 1

## 2014-10-29 NOTE — Care Management (Signed)
Admitted with the diagnosis of myofascitis. Live with wife, Rosann Auerbach, [phone]). Works at [employer] x 14 years. Takes care of all basic and instrumental activities of daily living himself. No home Health in the past. Uses no aids for ambulation. Last seen Dr. Westley Gambles about a week ago. Family will transport. Scheduled for MRI today. ID consult.  Shelbie Ammons RN MSN Care Management [phone]

## 2014-10-29 NOTE — Progress Notes (Signed)
CC: Right thigh pain  Subjective: 59 year old male admitted to the medicine service with right-sided thigh pain secondary to mild fasciitis from self injection of testosterone. He has been being treated with IV antibiotics. Patient reports that he feels somewhat better today than he has previous visits. He denies any fevers, chills, nausea, vomiting, diarrhea, constipation. Complaint is continued pain to the side of his right thigh however this is improving.  Objective: Vital signs in last 24 hours: Temp:  [98 F (36.7 C)-98.8 F (37.1 C)] 98.8 F (37.1 C) (09/21 1329) Pulse Rate:  [87-90] 87 (09/21 1329) Resp:  [18] 18 (09/21 0519) BP: (137-149)/(73-81) 149/81 mmHg (09/21 1329) SpO2:  [97 %-98 %] 98 % (09/21 1329) Weight:  [72.576 kg (160 lb)] 72.576 kg (160 lb) (09/21 1018) Last BM Date: 10/28/14  Intake/Output from previous day: 09/20 0701 - 09/21 0700 In: 840 [P.O.:840] Out: 500 [Urine:500] Intake/Output this shift: Total I/O In: 240 [P.O.:240] Out: -   Physical exam:  Gen.: No acute distress Chest: Clear to auscultation Heart: Regular rhythm Abdomen,: Soft, nontender, nondistended. Extremities: Right thigh with no visible cellulitis, no palpable induration or fluctuance. Tender to deep palpation of the anterior thigh compartments.  Lab Results: CBC   Recent Labs  10/28/14 0558 10/29/14 0609  WBC 10.6 7.1  HGB 12.0* 11.5*  HCT 36.4* 34.4*  PLT 233 224   BMET  Recent Labs  10/28/14 0558 10/29/14 0609  NA 139 139  K 4.2 4.1  CL 108 113*  CO2 26 21*  GLUCOSE 110* 129*  BUN 15 8  CREATININE 1.05 0.91  CALCIUM 7.9* 7.6*   PT/INR No results for input(s): LABPROT, INR in the last 72 hours. ABG No results for input(s): PHART, HCO3 in the last 72 hours.  Invalid input(s): PCO2, PO2  Studies/Results: Mr Femur Right W Wo Contrast  10/29/2014   CLINICAL DATA:  Right thigh pain, redness and swelling since 10/23/2014 possibly related to testosterone  injections.  EXAM: MRI OF THE RIGHT FEMUR WITHOUT AND WITH CONTRAST  TECHNIQUE: Multiplanar, multisequence MR imaging of the lower right extremity was performed both before and after administration of intravenous contrast.  CONTRAST:  15 ml MULTIHANCE GADOBENATE DIMEGLUMINE 529 MG/ML IV SOLN  COMPARISON:  CT scan of the the right upper leg 10/27/2014.  FINDINGS: The axial and coronal sequences incidentally include the left upper leg. There is subcutaneous edema and enhancement about the right upper leg. The tensor fascia lata, rectus femoris, vastus medialis and vastus lateralis all demonstrate edema and enhancement consistent with myositis. Extent of involvement appears greatest in the rectus femoris. No rim enhancing fluid collection is identified. Also seen is mild edema and enhancement superficial fascia of the anterior compartment. There is no bone marrow signal abnormality to suggest osteomyelitis.  Incidentally imaged left upper leg shows a hip replacement in place. Intramedullary focus of signal abnormality diaphysis of the distal left femur is most consistent with a medullary infarct.  IMPRESSION: Findings consistent with cellulitis of the right upper leg with myositis of the tensor fascia lata, vastus lateralis, rectus femoris and vastus medialis. Mild enhancement of superficial fascia about the anterior compartment is compatible with fasciitis. Negative for abscess or osteomyelitis.   Electronically Signed   By: Inge Rise M.D.   On: 10/29/2014 12:17    Anti-infectives: Anti-infectives    Start     Dose/Rate Route Frequency Ordered Stop   10/27/14 1500  clindamycin (CLEOCIN) IVPB 600 mg     600 mg 100 mL/hr  over 30 Minutes Intravenous 3 times per day 10/27/14 1446     10/27/14 1245  ceFAZolin (ANCEF) IVPB 1 g/50 mL premix     1 g 100 mL/hr over 30 Minutes Intravenous  Once 10/27/14 1232 10/27/14 1344      Assessment/Plan:  59 year old male with right thigh mild fasciitis. This  appears to be improving with IV antibiotics. No current indication for surgical intervention.  Charles T. Adonis Huguenin, MD, FACS  10/29/2014

## 2014-10-29 NOTE — Progress Notes (Signed)
[facility] [place] NAME: Gerald Jefferson    MR#:  [id number]  DATE OF BIRTH:  Jan 20, 1956  SUBJECTIVE:  CHIEF COMPLAINT:   Chief Complaint  Patient presents with  . Leg Pain   Continued right thigh pain with limited movement of the leg due to pain. Erythema and swelling seemed to be decreasing. No fevers. White count decreasing  REVIEW OF SYSTEMS:   Review of Systems  Constitutional: Negative for fever.  Respiratory: Negative for shortness of breath.   Cardiovascular: Positive for leg swelling. Negative for chest pain and palpitations.  Gastrointestinal: Negative for nausea, vomiting and abdominal pain.  Genitourinary: Negative for dysuria.  Musculoskeletal: Positive for myalgias.    DRUG ALLERGIES:  No Known Allergies  VITALS:  Blood pressure 139/78, pulse 90, temperature 98.5 F (36.9 C), temperature source Oral, resp. rate 18, height 6' (1.829 m), weight 72.576 kg (160 lb), SpO2 97 %.  PHYSICAL EXAMINATION:  GENERAL:  59 y.o.-year-old patient lying in the bed with no acute distress.  CARDIOVASCULAR: S1, S2 normal. No murmurs, rubs, or gallops.  ABDOMEN: Soft, nontender, nondistended. Bowel sounds present. No organomegaly or mass.  EXTREMITIES: Right thigh swollen, tender to touch, no skin discoloration no focal area of pain, no fluctuance. The injection site is not obvious NEUROLOGIC: Cranial nerves II through XII are intact. Muscle strength 5/5 in all extremities. Sensation intact. Gait not checked.  PSYCHIATRIC: The patient is alert and oriented x 3.  SKIN: No obvious rash, lesion, or ulcer.   LABORATORY PANEL:   CBC  Recent Labs Lab 10/29/14 0609  WBC 7.1  HGB 11.5*  HCT 34.4*  PLT 224   ------------------------------------------------------------------------------------------------------------------  Chemistries   Recent Labs Lab 10/27/14 1123  10/29/14 0609  NA 135  < > 139  K 4.0  < > 4.1  CL 103   < > 113*  CO2 25  < > 21*  GLUCOSE 90  < > 129*  BUN 20  < > 8  CREATININE 1.30*  < > 0.91  CALCIUM 8.2*  < > 7.6*  AST 18  --   --   ALT 11*  --   --   ALKPHOS 41  --   --   BILITOT 0.7  --   --   < > = values in this interval not displayed. ------------------------------------------------------------------------------------------------------------------  Cardiac Enzymes No results for input(s): TROPONINI in the last 168 hours. ------------------------------------------------------------------------------------------------------------------  RADIOLOGY:  Ct Femur Right W Contrast  10/27/2014   CLINICAL DATA:  Right thigh pain and swelling since last Thursday. Possibly related to testosterone injections.  EXAM: CT OF THE LOWER RIGHT EXTREMITY WITH CONTRAST  TECHNIQUE: Multidetector CT imaging of the right thigh was performed according to the standard protocol following intravenous contrast administration.  COMPARISON:  None.  CONTRAST:  141mL OMNIPAQUE IOHEXOL 300 MG/ML  SOLN  FINDINGS: There is diffuse subcutaneous soft tissue swelling/ edema involving the anterior aspect of the right thigh all the way down to the knee. This is most likely cellulitis. This also marked inflammation, swelling, edema and Back Scholl fluid in the anterior compartment of the thigh. I do not see a discrete rim enhancing soft tissue abscess but this would be much better assessed with MRI. No hip or knee joint effusion. The bony structures are intact. No destructive bony changes to suggest osteomyelitis. No inguinal mass or adenopathy. No significant intrapelvic abnormalities. A right hip prosthesis is noted.  Moderate scattered atherosclerotic calcifications noted.  The venous structures appear patent.  IMPRESSION: Diffuse and fairly marked soft tissue swelling/edema/ fluid involving the anterior compartment musculature without discrete rim enhancing abscess. This is consistent with myofasciitis. This is most notable in  the rectus femoris muscle with fluid and a small amount of air anterior to the muscle in the distal thigh.  Diffuse cellulitis.  No findings for septic arthritis or osteomyelitis.  If symptoms persist or worsen MRI is recommended for further evaluation.   Electronically Signed   By: Marijo Sanes M.D.   On: 10/27/2014 13:53   Mr Femur Right W Wo Contrast  10/29/2014   CLINICAL DATA:  Right thigh pain, redness and swelling since 10/23/2014 possibly related to testosterone injections.  EXAM: MRI OF THE RIGHT FEMUR WITHOUT AND WITH CONTRAST  TECHNIQUE: Multiplanar, multisequence MR imaging of the lower right extremity was performed both before and after administration of intravenous contrast.  CONTRAST:  15 ml MULTIHANCE GADOBENATE DIMEGLUMINE 529 MG/ML IV SOLN  COMPARISON:  CT scan of the the right upper leg 10/27/2014.  FINDINGS: The axial and coronal sequences incidentally include the left upper leg. There is subcutaneous edema and enhancement about the right upper leg. The tensor fascia lata, rectus femoris, vastus medialis and vastus lateralis all demonstrate edema and enhancement consistent with myositis. Extent of involvement appears greatest in the rectus femoris. No rim enhancing fluid collection is identified. Also seen is mild edema and enhancement superficial fascia of the anterior compartment. There is no bone marrow signal abnormality to suggest osteomyelitis.  Incidentally imaged left upper leg shows a hip replacement in place. Intramedullary focus of signal abnormality diaphysis of the distal left femur is most consistent with a medullary infarct.  IMPRESSION: Findings consistent with cellulitis of the right upper leg with myositis of the tensor fascia lata, vastus lateralis, rectus femoris and vastus medialis. Mild enhancement of superficial fascia about the anterior compartment is compatible with fasciitis. Negative for abscess or osteomyelitis.   Electronically Signed   By: Inge Rise M.D.    On: 10/29/2014 12:17    EKG:   Orders placed or performed in visit on 05/27/14  . EKG 12-Lead    ASSESSMENT AND PLAN:   #1 myofasciitis: Improving, but very slowly with clindamycin. No cultures obtained on admission. MRI today shows cellulitis and myositis involving multiple muscles. Surgery has been consulted and there is no indication for intervention. CK level is normal. Leukocytosis decreasing. Will consult infectious disease for further antibiotics recommendations.  #2 acute kidney injury: Improved with IV fluids.   #3 anemia: chronic disease versus B12 deficiency  CODE STATUS: Full  TOTAL TIME TAKING CARE OF THIS PATIENT: 25 minutes.  Greater than 50% of time spent in care coordination and counseling. Care plan discussed with the patient and his wife at the bedside. POSSIBLE D/C IN 1-2 DAYS, DEPENDING ON CLINICAL CONDITION.   Myrtis Ser M.D on 10/29/2014 at 1:05 PM  Between 7am to 6pm - Pager - [phone]  After 6pm go to [email or web address] - password [email or web address] [facility]  Office  [phone]  CC: Primary care physician; Volanda Napoleon, MD

## 2014-10-29 NOTE — Plan of Care (Signed)
Problem: Discharge Progression Outcomes Goal: Other Discharge Outcomes/Goals Plan of care progress to goal: - Pain control via PRN pain meds. - Ambulates in room. - Continues IV fluids. - Continues ABX, Will continue to monitor.

## 2014-10-29 NOTE — Plan of Care (Signed)
Problem: Discharge Progression Outcomes Goal: Other Discharge Outcomes/Goals Outcome: Progressing Plan of care progress to goal: Tolerating diet well.  C/o pain. Oxycodone given, pt stated relief. Independent with needs.  IV ABX and IVF infusing. Plan for d/c home when ready.

## 2014-10-29 NOTE — Consult Note (Signed)
[facility] Infectious Disease     Reason for Consult:Myofascitis   Referring Physician: Myrtis Ser Date of Admission:  10/27/2014   Principal Problem:   Myofasciitis Active Problems:   Myositis   Renal insufficiency   Leukocytosis   HPI: Gerald Jefferson is a 59 y.o. male with HTN, CAD, hyperlipidemia who was admitted 9/19 with swelling redness in thigh following IM testosterone injection on 9/15.  On admit he had wbc 13.7, now down to 7.1. He has been on clindamycin IV but continues to have pain and swelling but remains afebrile.   CT imaging on admit showed cellulitus and myofasciitis and fu MRI 9/21 continues to demonstrate myositis.    Since admit he has been on clindamycin. Has noticed  Improvement- pain was 10/10, now 7/10 when walking. 5/10 at rest. No fevers. Some decrease in swelling. Past Medical History  Diagnosis Date  . Pulmonary embolism   . Anemia 2009    while he was on Warfarin, Aspirin and Plavix. Resolved.   . Blood in stool   . Coronary artery disease   . Hyperlipidemia   . Essential hypertension   . ED (erectile dysfunction)   . Asthma    Past Surgical History  Procedure Laterality Date  . Total hip arthroplasty    . Coronary angioplasty with stent placement  04/2006    Cypher DES to LAD and OM1  . Cardiac catheterization  07/2007    Patent LAD/OM stents. Stable 40% stenosis in proximal RCA   Social History  Substance Use Topics  . Smoking status: Former Smoker -- 1.50 packs/day for 30 years    Types: Cigarettes    Quit date: 04/08/2006  . Smokeless tobacco: Never Used  . Alcohol Use: No   Family History  Problem Relation Age of Onset  . Hypertension Mother     Allergies: No Known Allergies  Current antibiotics: Antibiotics Given (last 72 hours)    Date/Time Action Medication Dose Rate   10/27/14 1526 Given   clindamycin (CLEOCIN) IVPB 600 mg 600 mg 100 mL/hr   10/27/14 2136 Given   clindamycin (CLEOCIN) IVPB 600 mg 600 mg 100  mL/hr   10/28/14 0544 Given   clindamycin (CLEOCIN) IVPB 600 mg 600 mg 100 mL/hr   10/28/14 1531 Given   clindamycin (CLEOCIN) IVPB 600 mg 600 mg 100 mL/hr   10/28/14 2149 Given   clindamycin (CLEOCIN) IVPB 600 mg 600 mg 100 mL/hr   10/29/14 0523 Given   clindamycin (CLEOCIN) IVPB 600 mg 600 mg 100 mL/hr      MEDICATIONS: . amLODipine  5 mg Oral Daily  . aspirin EC  81 mg Oral Daily  . budesonide-formoterol  2 puff Inhalation BID  . clindamycin (CLEOCIN) IV  600 mg Intravenous 3 times per day  . docusate sodium  100 mg Oral BID  . enoxaparin (LOVENOX) injection  40 mg Subcutaneous Q24H  . ferrous sulfate  325 mg Oral Daily  . fluticasone  1 spray Each Nare Daily  . gabapentin  600 mg Oral TID  . HYDROcodone-acetaminophen  1 tablet Oral TID  . loratadine  10 mg Oral Daily  . pantoprazole  40 mg Oral Daily  . ramipril  10 mg Oral Daily  . rOPINIRole  0.25 mg Oral QHS  . rosuvastatin  20 mg Oral QHS  . tiZANidine  2 mg Oral QHS  . vitamin B-12  1,000 mcg Oral Daily    Review of Systems - 11 systems reviewed and negative per  HPI   OBJECTIVE: Temp:  [98 F (36.7 C)-98.5 F (36.9 C)] 98.5 F (36.9 C) (09/21 0519) Pulse Rate:  [89-90] 90 (09/21 0519) Resp:  [18-20] 18 (09/21 0519) BP: (129-139)/(72-78) 139/78 mmHg (09/21 0519) SpO2:  [97 %-99 %] 97 % (09/21 0519) Weight:  [72.576 kg (160 lb)] 72.576 kg (160 lb) (09/21 1018) Physical Exam  Constitutional: He is oriented to person, place, and time. He appears well-developed and well-nourished. No distress.  HENT:  Mouth/Throat: Oropharynx is clear and moist. No oropharyngeal exudate.  Cardiovascular: Normal rate, regular rhythm and normal heart sounds. Exam reveals no gallop and no friction rub.  No murmur heard.  Pulmonary/Chest: Effort normal and breath sounds normal. No respiratory distress. He has no wheezes.  Abdominal: Soft. Bowel sounds are normal. He exhibits no distension. There is no tenderness.  Lymphadenopathy:   He has no cervical adenopathy.  Neurological: He is alert and oriented to person, place, and time.  Ext R thigh is swollen, mild ttp, minimal redness, able to move but does have pain with movement Skin: Skin is warm and dry. No rash noted. No erythema.  Psychiatric: He has a normal mood and affect. His behavior is normal.    LABS: Results for orders placed or performed during the hospital encounter of 10/27/14 (from the past 48 hour(s))  Basic metabolic panel     Status: Abnormal   Collection Time: 10/28/14  5:58 AM  Result Value Ref Range   Sodium 139 135 - 145 mmol/L   Potassium 4.2 3.5 - 5.1 mmol/L   Chloride 108 101 - 111 mmol/L   CO2 26 22 - 32 mmol/L   Glucose, Bld 110 (H) 65 - 99 mg/dL   BUN 15 6 - 20 mg/dL   Creatinine, Ser 1.05 0.61 - 1.24 mg/dL   Calcium 7.9 (L) 8.9 - 10.3 mg/dL   GFR calc non Af Amer >60 >60 mL/min   GFR calc Af Amer >60 >60 mL/min    Comment: (NOTE) The eGFR has been calculated using the CKD EPI equation. This calculation has not been validated in all clinical situations. eGFR's persistently <60 mL/min signify possible Chronic Kidney Disease.    Anion gap 5 5 - 15  CBC     Status: Abnormal   Collection Time: 10/28/14  5:58 AM  Result Value Ref Range   WBC 10.6 3.8 - 10.6 K/uL   RBC 3.92 (L) 4.40 - 5.90 MIL/uL   Hemoglobin 12.0 (L) 13.0 - 18.0 g/dL   HCT 36.4 (L) 40.0 - 52.0 %   MCV 92.8 80.0 - 100.0 fL   MCH 30.7 26.0 - 34.0 pg   MCHC 33.0 32.0 - 36.0 g/dL   RDW 14.0 11.5 - 14.5 %   Platelets 233 150 - 440 K/uL  Vitamin B12     Status: None   Collection Time: 10/28/14  5:58 AM  Result Value Ref Range   Vitamin B-12 200 180 - 914 pg/mL    Comment: (NOTE) This assay is not validated for testing neonatal or myeloproliferative syndrome specimens for Vitamin B12 levels. Performed at [facility]   Folate     Status: None   Collection Time: 10/28/14  5:58 AM  Result Value Ref Range   Folate 9.2 >5.9 ng/mL  Iron and TIBC     Status:  Abnormal   Collection Time: 10/28/14  5:58 AM  Result Value Ref Range   Iron 18 (L) 45 - 182 ug/dL   TIBC 213 (L) 250 -  450 ug/dL   Saturation Ratios 9 (L) 17.9 - 39.5 %   UIBC 195 ug/dL  Ferritin     Status: None   Collection Time: 10/28/14  5:58 AM  Result Value Ref Range   Ferritin 292 24 - 336 ng/mL  Reticulocytes     Status: Abnormal   Collection Time: 10/28/14  5:58 AM  Result Value Ref Range   Retic Ct Pct 2.0 0.4 - 3.1 %   RBC. 3.97 (L) 4.40 - 5.90 MIL/uL   Retic Count, Manual 79.4 19.0 - 183.0 K/uL  CK     Status: Abnormal   Collection Time: 10/28/14  5:58 AM  Result Value Ref Range   Total CK 46 (L) 49 - 397 U/L  CBC     Status: Abnormal   Collection Time: 10/29/14  6:09 AM  Result Value Ref Range   WBC 7.1 3.8 - 10.6 K/uL   RBC 3.66 (L) 4.40 - 5.90 MIL/uL   Hemoglobin 11.5 (L) 13.0 - 18.0 g/dL   HCT 34.4 (L) 40.0 - 52.0 %   MCV 94.0 80.0 - 100.0 fL   MCH 31.5 26.0 - 34.0 pg   MCHC 33.5 32.0 - 36.0 g/dL   RDW 13.8 11.5 - 14.5 %   Platelets 224 150 - 440 K/uL  Basic metabolic panel     Status: Abnormal   Collection Time: 10/29/14  6:09 AM  Result Value Ref Range   Sodium 139 135 - 145 mmol/L   Potassium 4.1 3.5 - 5.1 mmol/L   Chloride 113 (H) 101 - 111 mmol/L   CO2 21 (L) 22 - 32 mmol/L   Glucose, Bld 129 (H) 65 - 99 mg/dL   BUN 8 6 - 20 mg/dL   Creatinine, Ser 0.91 0.61 - 1.24 mg/dL   Calcium 7.6 (L) 8.9 - 10.3 mg/dL   GFR calc non Af Amer >60 >60 mL/min   GFR calc Af Amer >60 >60 mL/min    Comment: (NOTE) The eGFR has been calculated using the CKD EPI equation. This calculation has not been validated in all clinical situations. eGFR's persistently <60 mL/min signify possible Chronic Kidney Disease.    Anion gap 5 5 - 15   No components found for: ESR, C REACTIVE PROTEIN MICRO: No results found for this or any previous visit (from the past 720 hour(s)).  IMAGING: Ct Femur Right W Contrast  10/27/2014   CLINICAL DATA:  Right thigh pain and swelling  since last Thursday. Possibly related to testosterone injections.  EXAM: CT OF THE LOWER RIGHT EXTREMITY WITH CONTRAST  TECHNIQUE: Multidetector CT imaging of the right thigh was performed according to the standard protocol following intravenous contrast administration.  COMPARISON:  None.  CONTRAST:  163m OMNIPAQUE IOHEXOL 300 MG/ML  SOLN  FINDINGS: There is diffuse subcutaneous soft tissue swelling/ edema involving the anterior aspect of the right thigh all the way down to the knee. This is most likely cellulitis. This also marked inflammation, swelling, edema and Back Scholl fluid in the anterior compartment of the thigh. I do not see a discrete rim enhancing soft tissue abscess but this would be much better assessed with MRI. No hip or knee joint effusion. The bony structures are intact. No destructive bony changes to suggest osteomyelitis. No inguinal mass or adenopathy. No significant intrapelvic abnormalities. A right hip prosthesis is noted.  Moderate scattered atherosclerotic calcifications noted. The venous structures appear patent.  IMPRESSION: Diffuse and fairly marked soft tissue swelling/edema/ fluid involving the anterior compartment  musculature without discrete rim enhancing abscess. This is consistent with myofasciitis. This is most notable in the rectus femoris muscle with fluid and a small amount of air anterior to the muscle in the distal thigh.  Diffuse cellulitis.  No findings for septic arthritis or osteomyelitis.  If symptoms persist or worsen MRI is recommended for further evaluation.   Electronically Signed   By: Marijo Sanes M.D.   On: 10/27/2014 13:53   Mr Femur Right W Wo Contrast  10/29/2014   CLINICAL DATA:  Right thigh pain, redness and swelling since 10/23/2014 possibly related to testosterone injections.  EXAM: MRI OF THE RIGHT FEMUR WITHOUT AND WITH CONTRAST  TECHNIQUE: Multiplanar, multisequence MR imaging of the lower right extremity was performed both before and after  administration of intravenous contrast.  CONTRAST:  15 ml MULTIHANCE GADOBENATE DIMEGLUMINE 529 MG/ML IV SOLN  COMPARISON:  CT scan of the the right upper leg 10/27/2014.  FINDINGS: The axial and coronal sequences incidentally include the left upper leg. There is subcutaneous edema and enhancement about the right upper leg. The tensor fascia lata, rectus femoris, vastus medialis and vastus lateralis all demonstrate edema and enhancement consistent with myositis. Extent of involvement appears greatest in the rectus femoris. No rim enhancing fluid collection is identified. Also seen is mild edema and enhancement superficial fascia of the anterior compartment. There is no bone marrow signal abnormality to suggest osteomyelitis.  Incidentally imaged left upper leg shows a hip replacement in place. Intramedullary focus of signal abnormality diaphysis of the distal left femur is most consistent with a medullary infarct.  IMPRESSION: Findings consistent with cellulitis of the right upper leg with myositis of the tensor fascia lata, vastus lateralis, rectus femoris and vastus medialis. Mild enhancement of superficial fascia about the anterior compartment is compatible with fasciitis. Negative for abscess or osteomyelitis.   Electronically Signed   By: Inge Rise M.D.   On: 10/29/2014 12:17    Assessment:   Gerald Jefferson is a 59 y.o. male with HTN, CAD, hyperlipidemia who was admitted 9/19 with swelling redness in thigh following IM testosterone injection on 9/15.  On admit he had wbc 13.7, now down to 7.1. He has been on clindamycin IV but continues to have pain and swelling but remains afebrile.   CT imaging on admit showed cellulitus and myofasciitis and fu MRI 9/21 continues to demonstrate myositis.    Since admit he has been on clindamycin. Has noticed  Improvement- pain was 10/10, now 7/10 when walking. 5/10 at rest. No fevers. Some decrease in swelling.  I think he is slowly improving and will  continue to do so with current treatment  Recommendations Cont IV clinda until 9/22 Can change to oral clinda 450 tid for total 14 day course I can see next Friday in clinic to see if can discontinue abx. I can see sooner if he worsens.   Thank you very much for allowing me to participate in the care of this patient. Please call with questions.   Cheral Marker. Ola Spurr, MD

## 2014-10-30 LAB — BASIC METABOLIC PANEL
Anion gap: 5 (ref 5–15)
BUN: 7 mg/dL (ref 6–20)
CHLORIDE: 112 mmol/L — AB (ref 101–111)
CO2: 25 mmol/L (ref 22–32)
CREATININE: 0.87 mg/dL (ref 0.61–1.24)
Calcium: 8.3 mg/dL — ABNORMAL LOW (ref 8.9–10.3)
GFR calc Af Amer: >60 mL/min (ref >60)
GFR calc non Af Amer: >60 mL/min (ref >60)
Glucose, Bld: 103 mg/dL — ABNORMAL HIGH (ref 65–99)
POTASSIUM: 4.6 mmol/L (ref 3.5–5.1)
SODIUM: 142 mmol/L (ref 135–145)

## 2014-10-30 LAB — CBC
HEMATOCRIT: 36.7 % — AB (ref 40.0–52.0)
HEMOGLOBIN: 12.3 g/dL — AB (ref 13.0–18.0)
MCH: 31.3 pg (ref 26.0–34.0)
MCHC: 33.5 g/dL (ref 32.0–36.0)
MCV: 93.5 fL (ref 80.0–100.0)
Platelets: 256 10*3/uL (ref 150–440)
RBC: 3.92 MIL/uL — AB (ref 4.40–5.90)
RDW: 13.8 % (ref 11.5–14.5)
WBC: 5.4 10*3/uL (ref 3.8–10.6)

## 2014-10-30 MED ORDER — CLINDAMYCIN HCL 300 MG PO CAPS: 300.0000 mg | ORAL_CAPSULE | Freq: Three times a day (TID) | ORAL | Status: DC

## 2014-10-30 MED ORDER — CLINDAMYCIN HCL 300 MG PO CAPS: 600.0000 mg | ORAL_CAPSULE | Freq: Three times a day (TID) | ORAL | Status: DC

## 2014-10-30 MED ORDER — CYANOCOBALAMIN 1000 MCG PO TABS: 1000.0000 ug | ORAL_TABLET | Freq: Every day | ORAL | Status: AC

## 2014-10-30 NOTE — Progress Notes (Signed)
MD order to discharge.  Reviewed discharge instructions with pt and wife. Both verbalized understanding IC discontinued.  Pt discharged.

## 2014-10-30 NOTE — Progress Notes (Addendum)
[place], [place].   10/30/2014  Patient: Gerald Jefferson   Date of Birth:  [birth year]  Date of admission:  10/27/2014  Date of Discharge  10/30/2014    To Whom it May Concern:   [name]  may return to work on 11/03/2014.  WORK-EMPLOYMENT:  Full Duty  If you have any questions or concerns, please don't hesitate to call.  Sincerely,   Myrtis Ser M.D Pager Number- [place] : [phone]   .

## 2014-10-30 NOTE — Discharge Instructions (Signed)

## 2014-10-30 NOTE — Discharge Summary (Signed)
[facility] NAME: Gerald Jefferson    MR#:  [id number]  DATE OF BIRTH:  1955-06-02  DATE OF ADMISSION:  10/27/2014 ADMITTING PHYSICIAN: Theodoro Grist, MD  DATE OF DISCHARGE: 10/30/2014  PRIMARY CARE PHYSICIAN: Volanda Napoleon, MD    ADMISSION DIAGNOSIS:  Infection [B99.9] Myofascitis [M60.9] Cellulitis of right lower extremity [A83.419]  DISCHARGE DIAGNOSIS:  Principal Problem:   Myofasciitis Active Problems:   Myositis   Renal insufficiency   Leukocytosis   SECONDARY DIAGNOSIS:   Past Medical History  Diagnosis Date  . Pulmonary embolism   . Anemia 2009    while he was on Warfarin, Aspirin and Plavix. Resolved.   . Blood in stool   . Coronary artery disease   . Hyperlipidemia   . Essential hypertension   . ED (erectile dysfunction)   . Asthma     HOSPITAL COURSE:    #1 myofasciitis: Secondary to infection after testosterone injection. No blood or wound cultures available. CT and MRI performed both showing cellulitis and myositis involving multiple muscles. Surgery service was consulted, no indication for intervention. Infectious disease was also consulted and he will continue on clindamycin to complete a 14 day course. He will follow up with his primary care physician within one week. If symptoms worsen he will see infectious disease. At this time swelling and erythema are much decreased. He still has some tenderness to touch but is able to bear weight and move comfortably. White blood cell count has returned to normal.  #2 acute kidney injury: Resolved. Improved with IV fluids.   #3 anemia: Mild B12 deficiency. Have started oral B12 supplementation. Also continue with iron supplementation  #4 hypertension: Blood pressures well controlled during hospitalization on home regimen. No changes were made  DISCHARGE CONDITIONS:   Stable  CONSULTS OBTAINED:  Treatment Team:   Clayburn Pert, MD Adrian Prows, MD  DRUG ALLERGIES:  No Known Allergies  DISCHARGE MEDICATIONS:   Current Discharge Medication List    START taking these medications   Details  clindamycin (CLEOCIN) 300 MG capsule Take 2 capsules (600 mg total) by mouth 3 (three) times daily. Qty: 60 capsule, Refills: 0    vitamin B-12 1000 MCG tablet Take 1 tablet (1,000 mcg total) by mouth daily. Qty: 30 tablet, Refills: 1      CONTINUE these medications which have NOT CHANGED   Details  amLODipine (NORVASC) 5 MG tablet Take 1 tablet (5 mg total) by mouth daily. Qty: 30 tablet, Refills: 6    aspirin EC 81 MG tablet Take 81 mg by mouth daily.    budesonide-formoterol (SYMBICORT) 80-4.5 MCG/ACT inhaler Inhale 2 puffs into the lungs 2 (two) times daily.    cyclobenzaprine (FLEXERIL) 10 MG tablet Take 1 tablet by mouth 3 (three) times daily as needed for muscle spasms.     ferrous sulfate 325 (65 FE) MG tablet Take 325 mg by mouth daily.     fexofenadine (ALLEGRA) 180 MG tablet Take 180 mg by mouth daily.    fluticasone (FLONASE) 50 MCG/ACT nasal spray Place 1 spray into both nostrils daily.    gabapentin (NEURONTIN) 600 MG tablet Take 600 mg by mouth 3 (three) times daily.    HYDROcodone-acetaminophen (NORCO) 10-325 MG per tablet Take 1 tablet by mouth 3 (three) times daily.    omeprazole (PRILOSEC) 20 MG capsule Take 1 capsule (20 mg total) by mouth daily. Qty: 30 capsule, Refills: 6    promethazine (  PHENERGAN) 25 MG tablet Take 25 mg by mouth every 6 (six) hours as needed for nausea or vomiting.    ramipril (ALTACE) 10 MG capsule Take 1 capsule (10 mg total) by mouth daily. Qty: 30 capsule, Refills: 11    rOPINIRole (REQUIP) 0.25 MG tablet Take 0.25 mg by mouth at bedtime.    rosuvastatin (CRESTOR) 20 MG tablet Take 20 mg by mouth at bedtime.     sildenafil (VIAGRA) 100 MG tablet Take 100 mg by mouth daily as needed for erectile dysfunction.    tiZANidine (ZANAFLEX) 2 MG  tablet Take 2 mg by mouth at bedtime.           DISCHARGE INSTRUCTIONS:    Patient discharged in stable condition. No home health needs. No home oxygen. Maintain heart healthy diet.  If you experience worsening of your admission symptoms, develop shortness of breath, life threatening emergency, suicidal or homicidal thoughts you must seek medical attention immediately by calling 911 or calling your MD immediately  if symptoms less severe.  You Must read complete instructions/literature along with all the possible adverse reactions/side effects for all the Medicines you take and that have been prescribed to you. Take any new Medicines after you have completely understood and accept all the possible adverse reactions/side effects.   Please note  You were cared for by a hospitalist during your hospital stay. If you have any questions about your discharge medications or the care you received while you were in the hospital after you are discharged, you can call the unit and asked to speak with the hospitalist on call if the hospitalist that took care of you is not available. Once you are discharged, your primary care physician will handle any further medical issues. Please note that NO REFILLS for any discharge medications will be authorized once you are discharged, as it is imperative that you return to your primary care physician (or establish a relationship with a primary care physician if you do not have one) for your aftercare needs so that they can reassess your need for medications and monitor your lab values.    Today   CHIEF COMPLAINT:   Chief Complaint  Patient presents with  . Leg Pain    HISTORY OF PRESENT ILLNESS:   Gerald Jefferson is a 59 y.o. male with a known history of coronary artery disease, hyperlipidemia, hypertension, presents to the hospital with complaints of right thigh pain. I wonder patient, he was diagnosed with low T syndrome and was prescribed intramuscular  testosterone injections. He injected his last testosterone on Thursday, 10/23/2014 to anterior aspect of his right thigh, and over the next few days he noticed the leg becoming more and more swollen, painful and red. He tells me that his pain was as severe, 7 out of 10 by intensity, constant, increasing whenever he walks or puts weight on it , eases off with elevation of the leg. He presented to the hospital for further evaluation where he was noted to have leukocytosis on lab studies. CT scan of his right femur revealed swelling, edema and fluid collection in the anterior compartment of the musculature without obvious abscess, consistent with myofasciitis. Hospitalist services were contacted for admission  VITAL SIGNS:  Blood pressure 154/87, pulse 82, temperature 98.3 F (36.8 C), temperature source Oral, resp. rate 18, height 6' (1.829 m), weight 72.576 kg (160 lb), SpO2 95 %.  I/O:   Intake/Output Summary (Last 24 hours) at 10/30/14 1148 Last data filed at 10/30/14 0900  Gross per 24 hour  Intake    360 ml  Output      0 ml  Net    360 ml    PHYSICAL EXAMINATION:  GENERAL: 59 y.o.-year-old patient lying in the bed with no acute distress.  CARDIOVASCULAR: S1, S2 normal. No murmurs, rubs, or gallops.  ABDOMEN: Soft, nontender, nondistended. Bowel sounds present. No organomegaly or mass.  EXTREMITIES: Right thigh swollen, tender to touch, no skin discoloration no focal area of pain, no fluctuance. The injection site is not obvious NEUROLOGIC: Cranial nerves II through XII are intact. Muscle strength 5/5 in all extremities. Sensation intact. Gait not checked.  PSYCHIATRIC: The patient is alert and oriented x 3.  SKIN: No obvious rash, lesion, or ulcer.   DATA REVIEW:   CBC  Recent Labs Lab 10/30/14 0522  WBC 5.4  HGB 12.3*  HCT 36.7*  PLT 256    Chemistries   Recent Labs Lab 10/27/14 1123  10/30/14 0522  NA 135  < > 142  K 4.0  < > 4.6  CL 103  < > 112*  CO2 25   < > 25  GLUCOSE 90  < > 103*  BUN 20  < > 7  CREATININE 1.30*  < > 0.87  CALCIUM 8.2*  < > 8.3*  AST 18  --   --   ALT 11*  --   --   ALKPHOS 41  --   --   BILITOT 0.7  --   --   < > = values in this interval not displayed.  Cardiac Enzymes No results for input(s): TROPONINI in the last 168 hours.  Microbiology Results  No results found for this or any previous visit.  RADIOLOGY:  Mr Femur Right W Wo Contrast  10/29/2014   CLINICAL DATA:  Right thigh pain, redness and swelling since 10/23/2014 possibly related to testosterone injections.  EXAM: MRI OF THE RIGHT FEMUR WITHOUT AND WITH CONTRAST  TECHNIQUE: Multiplanar, multisequence MR imaging of the lower right extremity was performed both before and after administration of intravenous contrast.  CONTRAST:  15 ml MULTIHANCE GADOBENATE DIMEGLUMINE 529 MG/ML IV SOLN  COMPARISON:  CT scan of the the right upper leg 10/27/2014.  FINDINGS: The axial and coronal sequences incidentally include the left upper leg. There is subcutaneous edema and enhancement about the right upper leg. The tensor fascia lata, rectus femoris, vastus medialis and vastus lateralis all demonstrate edema and enhancement consistent with myositis. Extent of involvement appears greatest in the rectus femoris. No rim enhancing fluid collection is identified. Also seen is mild edema and enhancement superficial fascia of the anterior compartment. There is no bone marrow signal abnormality to suggest osteomyelitis.  Incidentally imaged left upper leg shows a hip replacement in place. Intramedullary focus of signal abnormality diaphysis of the distal left femur is most consistent with a medullary infarct.  IMPRESSION: Findings consistent with cellulitis of the right upper leg with myositis of the tensor fascia lata, vastus lateralis, rectus femoris and vastus medialis. Mild enhancement of superficial fascia about the anterior compartment is compatible with fasciitis. Negative for abscess  or osteomyelitis.   Electronically Signed   By: Inge Rise M.D.   On: 10/29/2014 12:17    EKG:   Orders placed or performed in visit on 05/27/14  . EKG 12-Lead      Management plans discussed with the patient, family and they are in agreement.  CODE STATUS:     Code Status Orders  Start     Ordered   10/27/14 1510  Full code   Continuous     10/27/14 1510      TOTAL TIME TAKING CARE OF THIS PATIENT: 35 minutes.  Greater than 50% of time spent in care coordination and counseling.  Myrtis Ser M.D on 10/30/2014 at 11:48 AM  Between 7am to 6pm - Pager - [phone]  After 6pm go to [email or web address] - password [email or web address] [facility]  Office  [phone]  CC: Primary care physician; Volanda Napoleon, MD

## 2014-11-27 ENCOUNTER — Encounter: Payer: Self-pay | Admitting: Urology

## 2014-11-27 ENCOUNTER — Ambulatory Visit (INDEPENDENT_AMBULATORY_CARE_PROVIDER_SITE_OTHER): Payer: BLUE CROSS/BLUE SHIELD | Admitting: Urology

## 2014-11-27 VITALS — BP 161/93 | HR 64 | Ht 72.0 in | Wt 163.0 lb

## 2014-11-27 DIAGNOSIS — N486 Induration penis plastica: Secondary | ICD-10-CM | POA: Diagnosis not present

## 2014-11-27 DIAGNOSIS — E291 Testicular hypofunction: Secondary | ICD-10-CM

## 2014-11-27 DIAGNOSIS — N529 Male erectile dysfunction, unspecified: Secondary | ICD-10-CM | POA: Diagnosis not present

## 2014-11-27 MED ORDER — ALPROSTADIL (VASODILATOR) 250 MCG UR PLLT: 250.0000 ug | PELLET | URETHRAL | Status: DC | PRN

## 2014-11-27 NOTE — Progress Notes (Signed)
11/27/2014 9:53 AM   Gerald Jefferson 1955-09-30 [id number]  Referring provider: [name], MD [address] [place], [place] [address]  Chief Complaint  Patient presents with  . Abnormal Penile Curvature    X 3 MTHS     HPI: The patient is a 59 year old gentleman who presents with a three-month history of a crooked erection. From Internet research, he believes he has been erroneous disease. He notes that it is not painful. However, he did not have this problem prior to 3 months ago. He also has erectile dysfunction he has tried Cialis, Viagra, and possibly Levitra which do not give him a sustainable erection for intercourse. He is interested in trying a different medication. He also has hypogonadism and is being treated for this by his primary care doctor. He does get his screening blood work on a regular basis.    PMH: Past Medical History  Diagnosis Date  . Pulmonary embolism ([place])   . Anemia 2009    while he was on Warfarin, Aspirin and Plavix. Resolved.   . Blood in stool   . Coronary artery disease   . Hyperlipidemia   . Essential hypertension   . ED (erectile dysfunction)   . Asthma   . Acid reflux   . Anxiety   . Arthritis   . Clotting disorder ([place])     DVT-leg and lung  . Depression     Surgical History: Past Surgical History  Procedure Laterality Date  . Total hip arthroplasty    . Coronary angioplasty with stent placement  04/2006    Cypher DES to LAD and OM1  . Cardiac catheterization  07/2007    Patent LAD/OM stents. Stable 40% stenosis in proximal RCA  . Nose surgery      Home Medications:    Medication List       This list is accurate as of: 11/27/14  9:53 AM.  Always use your most recent med list.               alprostadil 250 MCG pellet  Commonly known as:  MUSE  1 each (250 mcg total) by Transurethral route as needed for erectile dysfunction. use no more than 3 times per week     amLODipine 5 MG tablet  Commonly known as:   NORVASC  Take 1 tablet (5 mg total) by mouth daily.     aspirin EC 81 MG tablet  Take 81 mg by mouth daily.     budesonide-formoterol 80-4.5 MCG/ACT inhaler  Commonly known as:  SYMBICORT  Inhale 2 puffs into the lungs 2 (two) times daily.     clindamycin 300 MG capsule  Commonly known as:  CLEOCIN  Take 2 capsules (600 mg total) by mouth 3 (three) times daily.     cyanocobalamin 1000 MCG tablet  Take 1 tablet (1,000 mcg total) by mouth daily.     cyclobenzaprine 10 MG tablet  Commonly known as:  FLEXERIL  Take 1 tablet by mouth 3 (three) times daily as needed for muscle spasms.     ferrous sulfate 325 (65 FE) MG tablet  Take 325 mg by mouth daily.     fexofenadine 180 MG tablet  Commonly known as:  ALLEGRA  Take 180 mg by mouth daily.     fluticasone 50 MCG/ACT nasal spray  Commonly known as:  FLONASE  Place 1 spray into both nostrils daily.     gabapentin 600 MG tablet  Commonly known as:  NEURONTIN  Take 600 mg by mouth 3 (three) times daily.     HYDROcodone-acetaminophen 10-325 MG tablet  Commonly known as:  NORCO  Take 1 tablet by mouth 3 (three) times daily.     omeprazole 20 MG capsule  Commonly known as:  PRILOSEC  Take 1 capsule (20 mg total) by mouth daily.     promethazine 25 MG tablet  Commonly known as:  PHENERGAN  Take 25 mg by mouth every 6 (six) hours as needed for nausea or vomiting.     ramipril 10 MG capsule  Commonly known as:  ALTACE  Take 1 capsule (10 mg total) by mouth daily.     rOPINIRole 0.25 MG tablet  Commonly known as:  REQUIP  Take 0.25 mg by mouth at bedtime.     rosuvastatin 20 MG tablet  Commonly known as:  CRESTOR  Take 20 mg by mouth at bedtime.     sildenafil 100 MG tablet  Commonly known as:  VIAGRA  Take 100 mg by mouth daily as needed for erectile dysfunction.     tiZANidine 2 MG tablet  Commonly known as:  ZANAFLEX  Take 2 mg by mouth at bedtime.        Allergies: No Known Allergies  Family  History: Family History  Problem Relation Age of Onset  . Hypertension Mother   . Kidney cancer Neg Hx   . Kidney disease Neg Hx   . Prostate cancer Neg Hx     Social History:  reports that he quit smoking about 8 years ago. His smoking use included Cigarettes. He has a 45 pack-year smoking history. He has never used smokeless tobacco. He reports that he does not drink alcohol or use illicit drugs.  ROS: UROLOGY Frequent Urination?: No Hard to postpone urination?: No Burning/pain with urination?: No Get up at night to urinate?: Yes Leakage of urine?: No Urine stream starts and stops?: No Trouble starting stream?: No Do you have to strain to urinate?: No Blood in urine?: No Urinary tract infection?: No Sexually transmitted disease?: No Injury to kidneys or bladder?: No Painful intercourse?: No Weak stream?: No Erection problems?: Yes Penile pain?: No  Gastrointestinal Nausea?: No Vomiting?: No Indigestion/heartburn?: No Diarrhea?: No Constipation?: No  Constitutional Fever: No Night sweats?: No Weight loss?: No Fatigue?: Yes  Skin Skin rash/lesions?: No Itching?: No  Eyes Blurred vision?: No Double vision?: No  Ears/Nose/Throat Sore throat?: No Sinus problems?: Yes  Hematologic/Lymphatic Swollen glands?: No Easy bruising?: No  Cardiovascular Leg swelling?: No Chest pain?: No  Respiratory Cough?: No Shortness of breath?: No  Endocrine Excessive thirst?: No  Musculoskeletal Back pain?: Yes Joint pain?: Yes  Neurological Headaches?: No Dizziness?: No  Psychologic Depression?: Yes Anxiety?: Yes  Physical Exam: BP 161/93 mmHg  Pulse 64  Ht 6' (1.829 m)  Wt 163 lb (73.936 kg)  BMI 22.10 kg/m2  Constitutional:  Alert and oriented, No acute distress. HEENT: [place] AT, moist mucus membranes.  Trachea midline, no masses. Cardiovascular: No clubbing, cyanosis, or edema. Respiratory: Normal respiratory effort, no increased work of  breathing. GI: Abdomen is soft, nontender, nondistended, no abdominal masses GU: No CVA tenderness. Phallus has a approximate 2 cm plaque in the left corporal body. Normal testicles descended equally bilaterally. Skin: No rashes, bruises or suspicious lesions. Lymph: No cervical or inguinal adenopathy. Neurologic: Grossly intact, no focal deficits, moving all 4 extremities. Psychiatric: Normal mood and affect.  Laboratory Data: Lab Results  Component Value Date   WBC 5.4 10/30/2014  HGB 12.3* 10/30/2014   HCT 36.7* 10/30/2014   MCV 93.5 10/30/2014   PLT 256 10/30/2014    Lab Results  Component Value Date   CREATININE 0.87 10/30/2014    No results found for: PSA  No results found for: TESTOSTERONE  No results found for: HGBA1C  Urinalysis No results found for: COLORURINE, APPEARANCEUR, LABSPEC, PHURINE, GLUCOSEU, HGBUR, BILIRUBINUR, KETONESUR, PROTEINUR, UROBILINOGEN, NITRITE, LEUKOCYTESUR    Assessment & Plan:   1. Peyronie's disease-active phase The patient was informed that he'll wait until July 2017 for any intervention. It appears to be approximately 90 and a picture provided by the patient. We'll plan to induce artificial erection in July 2017 and measure the exact angle of that time. He is aware the treatment options depending whether or not the degree of curvature is less than a greater than 90. If we are unable to medically treat his erectile dysfunction as described below, the patient may be a candidate for an inflatable penile prosthesis.  2. Erectile dysfunction The patient has failed oral therapy. We will try Muse 250 g as you are to avoid injecting his corpora during the active phase of prone his disease. He is aware of the risk of priapism and was obstructed to present to the emergency room for an erection lasting over 4 hours. We will see how he responds to the [facility] and a follow-up appointment 3 months.  3. Hypogonadism This is currently managed by his  primary care doctor. We will defer management to his PCP at this time  Return in about 3 months (around 02/27/2015).   Nickie Retort, MD  [facility] [address], [place], [place] [address] [phone]

## 2015-01-03 ENCOUNTER — Encounter: Payer: Self-pay | Admitting: Infectious Diseases

## 2015-01-05 ENCOUNTER — Telehealth: Payer: Self-pay | Admitting: Urology

## 2015-01-05 MED ORDER — ALPROSTADIL (VASODILATOR) 500 MCG UR PLLT: 500.0000 ug | PELLET | URETHRAL | Status: DC | PRN

## 2015-01-05 NOTE — Telephone Encounter (Signed)
I sent him a rx to his pharmacy.  I increased the dosage. Please advise patient.  Thank you.

## 2015-01-05 NOTE — Telephone Encounter (Signed)
Patient needs a refill on medication (Muse) and is requesting an increase in the dosage.  Patient states that it is not doing anything at all for him to address the problem.  Please advise.

## 2015-01-22 ENCOUNTER — Telehealth: Payer: Self-pay | Admitting: Urology

## 2015-01-22 NOTE — Telephone Encounter (Signed)
Pt called asking if his Muse can be increased, the 500 mg is not helping and patient wants to know if it can be increased to the highest dose which is 1,000 mg.  Pt states that this has not worked for him for about two weeks.  Please call [phone].

## 2015-01-26 NOTE — Telephone Encounter (Signed)
OK for him to increase dose to 1,000 mcg. Please advise patient.

## 2015-01-27 NOTE — Telephone Encounter (Signed)
Pt needs refills. Please advise.

## 2015-01-28 ENCOUNTER — Other Ambulatory Visit: Payer: Self-pay

## 2015-01-28 DIAGNOSIS — N529 Male erectile dysfunction, unspecified: Secondary | ICD-10-CM

## 2015-01-28 MED ORDER — ALPROSTADIL (VASODILATOR) 1000 MCG UR PLLT
1000.0000 ug | PELLET | URETHRAL | Status: DC | PRN
Start: 2015-01-28 — End: 2015-05-21

## 2015-01-28 NOTE — Telephone Encounter (Signed)
Ok to prescribe him muse 1,000 mcg pellets. Use as directed. Disp: 6. Refill:11. thanks

## 2015-01-28 NOTE — Progress Notes (Signed)
Per Dr. Pilar Jarvis medication was sent to pt pharmacy.

## 2015-02-26 ENCOUNTER — Ambulatory Visit: Payer: BLUE CROSS/BLUE SHIELD

## 2015-02-27 ENCOUNTER — Ambulatory Visit: Payer: BLUE CROSS/BLUE SHIELD

## 2015-03-06 ENCOUNTER — Encounter: Payer: Self-pay | Admitting: Urology

## 2015-03-06 ENCOUNTER — Ambulatory Visit (INDEPENDENT_AMBULATORY_CARE_PROVIDER_SITE_OTHER): Payer: BLUE CROSS/BLUE SHIELD | Admitting: Urology

## 2015-03-06 VITALS — BP 113/73 | HR 81 | Ht 72.0 in | Wt 164.1 lb

## 2015-03-06 DIAGNOSIS — N529 Male erectile dysfunction, unspecified: Secondary | ICD-10-CM | POA: Diagnosis not present

## 2015-03-06 DIAGNOSIS — E291 Testicular hypofunction: Secondary | ICD-10-CM

## 2015-03-06 DIAGNOSIS — N486 Induration penis plastica: Secondary | ICD-10-CM

## 2015-03-06 NOTE — Progress Notes (Signed)
03/06/2015 2:25 PM   Gerald Jefferson [birth year] [id number]  Referring provider: [name], MD [address] [place], [place] [address]  Chief Complaint  Patient presents with  . Follow-up    peyronies, ED    HPI: The patient is a 60 year old gentleman who presents with a three-month history of a crooked erection. 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 He notes that it is not painful. However, he did not have this problem prior to 3 months ago. He also has erectile dysfunction he has tried Cialis, Viagra, and possibly Levitra which do not give him a sustainable erection for intercourse. He is interested in trying a different medication. He also has hypogonadism and is being treated for this by his primary care doctor. He does get his screening blood work on a regular basis.  He had a 2 cm plaque in the left corporal body on exam.  January 2017 interval history: The patient is been using Muse 1000 g when necessary. He says he is able to obtain somewhat erection but is not able to complete intercourse. He is asking for other options. He is in active phase of peroneal disease in this time. His curvature is new. He is not having pain though at this time. He cancurvature is around 90. He is really interested in trying something to help with his erections at this time.  PMH: Past Medical History  Diagnosis Date  . Pulmonary embolism ([place])   . Anemia 2009    while he was on Warfarin, Aspirin and Plavix. Resolved.   . Blood in stool   . Coronary artery disease   . Hyperlipidemia   . Essential hypertension   . ED (erectile dysfunction)   . Asthma   . Acid reflux   . Anxiety   . Arthritis   . Clotting disorder ([place])     DVT-leg and lung  . Depression     Surgical History: Past Surgical History  Procedure Laterality Date  . Total hip arthroplasty    . Coronary angioplasty with stent placement  04/2006    Cypher DES to LAD and OM1  .  Cardiac catheterization  07/2007    Patent LAD/OM stents. Stable 40% stenosis in proximal RCA  . Nose surgery      Home Medications:    Medication List       This list is accurate as of: 03/06/15  2:25 PM.  Always use your most recent med list.               Alprostadil (Vasodilator) 500 MCG Pllt  Commonly known as:  MUSE  Place 500 mcg into the urethra as needed.     alprostadil 1000 MCG pellet  Commonly known as:  MUSE  1 each (1,000 mcg total) by Transurethral route as needed for erectile dysfunction. use no more than 3 times per week     amLODipine 10 MG tablet  Commonly known as:  NORVASC  TAKE 1 TABLET BY MOUTH EVERY DAY IN THE MORNING     ANADROL-50 50 MG Tabs  Generic drug:  Oxymetholone     aspirin EC 81 MG tablet  Take 81 mg by mouth daily.     budesonide-formoterol 80-4.5 MCG/ACT inhaler  Commonly known as:  SYMBICORT  Inhale 2 puffs into the lungs 2 (two) times daily.     clindamycin 300 MG capsule  Commonly known as:  CLEOCIN  Take 2 capsules (600 mg total) by mouth 3 (  three) times daily.     clonazePAM 0.5 MG tablet  Commonly known as:  KLONOPIN     CVS POTASSIUM GLUCONATE 2 MEQ Tabs  Generic drug:  Potassium Gluconate  Take by mouth.     cyanocobalamin 1000 MCG tablet  Take 1 tablet (1,000 mcg total) by mouth daily.     cyclobenzaprine 10 MG tablet  Commonly known as:  FLEXERIL  Take 1 tablet by mouth 3 (three) times daily as needed for muscle spasms.     ferrous sulfate 325 (65 FE) MG tablet  Take 325 mg by mouth daily.     fexofenadine 180 MG tablet  Commonly known as:  ALLEGRA  Take 180 mg by mouth daily.     fluticasone 50 MCG/ACT nasal spray  Commonly known as:  FLONASE  Place 1 spray into both nostrils daily.     gabapentin 600 MG tablet  Commonly known as:  NEURONTIN  Take 600 mg by mouth 3 (three) times daily.     HYDROcodone-acetaminophen 10-325 MG tablet  Commonly known as:  NORCO  Take 1 tablet by mouth 3 (three) times  daily.     omeprazole 20 MG capsule  Commonly known as:  PRILOSEC  Take 1 capsule (20 mg total) by mouth daily.     promethazine 25 MG tablet  Commonly known as:  PHENERGAN  Take 25 mg by mouth every 6 (six) hours as needed for nausea or vomiting.     ramipril 10 MG capsule  Commonly known as:  ALTACE  Take 1 capsule (10 mg total) by mouth daily.     rOPINIRole 0.25 MG tablet  Commonly known as:  REQUIP  Take 0.25 mg by mouth at bedtime.     rosuvastatin 20 MG tablet  Commonly known as:  CRESTOR  Take 20 mg by mouth at bedtime.     sildenafil 100 MG tablet  Commonly known as:  VIAGRA  Take 100 mg by mouth daily as needed for erectile dysfunction. Reported on 03/06/2015     STENDRA 200 MG Tabs  Generic drug:  Avanafil     tiZANidine 2 MG tablet  Commonly known as:  ZANAFLEX  Take 2 mg by mouth at bedtime.     ZETIA 10 MG tablet  Generic drug:  ezetimibe        Allergies: No Known Allergies  Family History: Family History  Problem Relation Age of Onset  . Hypertension Mother   . Kidney cancer Neg Hx   . Kidney disease Neg Hx   . Prostate cancer Neg Hx     Social History:  reports that he quit smoking about 8 years ago. His smoking use included Cigarettes. He has a 45 pack-year smoking history. He has never used smokeless tobacco. He reports that he does not drink alcohol or use illicit drugs.  ROS: UROLOGY Frequent Urination?: No Hard to postpone urination?: No Burning/pain with urination?: No Get up at night to urinate?: No Leakage of urine?: No Urine stream starts and stops?: No Trouble starting stream?: No Do you have to strain to urinate?: No Blood in urine?: No Urinary tract infection?: No Sexually transmitted disease?: No Injury to kidneys or bladder?: No Painful intercourse?: No Weak stream?: No Erection problems?: Yes Penile pain?: No  Gastrointestinal Nausea?: No Vomiting?: No Indigestion/heartburn?: No Diarrhea?: No Constipation?:  No  Constitutional Fever: No Night sweats?: No Weight loss?: No Fatigue?: No  Skin Skin rash/lesions?: No Itching?: No  Eyes Blurred vision?: No Double vision?:  No  Ears/Nose/Throat Sore throat?: No Sinus problems?: No  Hematologic/Lymphatic Swollen glands?: No Easy bruising?: No  Cardiovascular Leg swelling?: No Chest pain?: No  Respiratory Cough?: No Shortness of breath?: No  Endocrine Excessive thirst?: No  Musculoskeletal Back pain?: No Joint pain?: No  Neurological Headaches?: No Dizziness?: No  Psychologic Depression?: No Anxiety?: No  Physical Exam: BP 113/73 mmHg  Pulse 81  Ht 6' (1.829 m)  Wt 164 lb 1.6 oz (74.435 kg)  BMI 22.25 kg/m2  Constitutional:  Alert and oriented, No acute distress. HEENT: [place] AT, moist mucus membranes.  Trachea midline, no masses. Cardiovascular: No clubbing, cyanosis, or edema. Respiratory: Normal respiratory effort, no increased work of breathing. GI: Abdomen is soft, nontender, nondistended, no abdominal masses GU: No CVA tenderness.  Skin: No rashes, bruises or suspicious lesions. Lymph: No cervical or inguinal adenopathy. Neurologic: Grossly intact, no focal deficits, moving all 4 extremities. Psychiatric: Normal mood and affect.  Laboratory Data: Lab Results  Component Value Date   WBC 5.4 10/30/2014   HGB 12.3* 10/30/2014   HCT 36.7* 10/30/2014   MCV 93.5 10/30/2014   PLT 256 10/30/2014    Lab Results  Component Value Date   CREATININE 0.87 10/30/2014    No results found for: PSA  No results found for: TESTOSTERONE  No results found for: HGBA1C  Urinalysis No results found for: COLORURINE, APPEARANCEUR, LABSPEC, PHURINE, GLUCOSEU, HGBUR, BILIRUBINUR, KETONESUR, PROTEINUR, UROBILINOGEN, NITRITE, LEUKOCYTESUR       Assessment & Plan:   1. Peyronie's disease-active phase The patient was informed that he'll wait until July 2017 for any intervention. It appears to be approximately 90  and a picture provided by the patient. We'll plan to induce artificial erection in July 2017 and measure the exact angle of that time. He is aware the treatment options depend whether or not the degree of curvature is less than a greater than 90. If we are unable to medically treat his erectile dysfunction as described below, the patient may be a candidate for an inflatable penile prosthesis. I did briefly discuss the penile prosthesis with him at this time, and he is not interested in pursuing this at any time in the near future.  2. Erectile dysfunction I discussed a vacuum erection device with the patient. He is agreeable to trying this. He was warned not to leave the penile ring on after the completion of intercourse. He will continue his Muse 1000 g until he is able to obtain the device. I am hesitant to offer him injections at this time until his active phase of Peyronie's disease has completed. We'll readdress this in July. Given his slight response Muse, I think he may respond very well to injection therapy, but again we will hold off on this until it has been one year since his Peyronie's disease was first noticed.  3. Hypogonadism This is currently managed by his primary care doctor. We will defer management to his PCP at this time       No Follow-up on file.  Nickie Retort, MD  [facility] [address], [place], [place] [address] [phone]

## 2015-03-18 ENCOUNTER — Telehealth: Payer: Self-pay

## 2015-03-18 NOTE — Telephone Encounter (Signed)
Pt called stating he was under the impression a rep would be contacting him about a penis vacuum pump. Where are you at in this process? What do I need to do?

## 2015-03-18 NOTE — Telephone Encounter (Signed)
Check with Bary Castilla or Judson Roch.  His information was supposed to be passed on to the penis vacuum pump rep.  Can we ensure that this has been done?

## 2015-03-18 NOTE — Telephone Encounter (Signed)
I called pt to inform him that we needed to fax over more information to [facility], no answer. LMOM.  I faxed the information over.

## 2015-03-31 ENCOUNTER — Telehealth: Payer: Self-pay | Admitting: Urology

## 2015-03-31 NOTE — Telephone Encounter (Signed)
Patient contacted his insurance company to inquire about the status of the vacuum pump.  I see a note where we faxed over information to [facility] on 03/18/15, but I do not see anything more recent.  Can you please follow up on this?

## 2015-04-03 NOTE — Telephone Encounter (Signed)
Spoke with pt in reference to vacuum pump. Pt stated that [employer] contacted him and told him it would be $300+, insurance would not cover any of it, and at the moment he does not have that kind of cash. Reinforced with pt if he changed his mind to give [place] a call. Pt voiced understanding.

## 2015-04-23 ENCOUNTER — Ambulatory Visit (INDEPENDENT_AMBULATORY_CARE_PROVIDER_SITE_OTHER): Payer: BLUE CROSS/BLUE SHIELD | Admitting: Urology

## 2015-04-23 ENCOUNTER — Encounter: Payer: Self-pay | Admitting: Urology

## 2015-04-23 VITALS — BP 129/77 | HR 75 | Ht 72.0 in | Wt 145.0 lb

## 2015-04-23 DIAGNOSIS — E291 Testicular hypofunction: Secondary | ICD-10-CM | POA: Diagnosis not present

## 2015-04-23 DIAGNOSIS — N529 Male erectile dysfunction, unspecified: Secondary | ICD-10-CM | POA: Diagnosis not present

## 2015-04-23 DIAGNOSIS — N486 Induration penis plastica: Secondary | ICD-10-CM | POA: Diagnosis not present

## 2015-04-23 NOTE — Progress Notes (Signed)
04/23/2015 4:11 PM   Stuart December 17, 1955 PT:[id number]  Referring provider: [name], MD [address] [place], [place] [address]  Chief Complaint  Patient presents with  . Erectile Dysfunction    HPI: The patient is a 60 year old gentleman who presents with a three-month history of a crooked erection. 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 He notes that it is not painful. However, he did not have this problem prior to 3 months ago. He also has erectile dysfunction he has tried Cialis, Viagra, and possibly Levitra which do not give him a sustainable erection for intercourse. He is interested in trying a different medication. He also has hypogonadism and is being treated for this by his primary care doctor. He does get his screening blood work on a regular basis. He had a 2 cm plaque in the left corporal body on exam.  January 2017 interval history: The patient is been using Muse 1000 g when necessary. He says he is able to obtain somewhat erection but is not able to complete intercourse. He is asking for other options. He is in active phase of peroneal disease in this time. His curvature is new. He is not having pain though at this time. He cancurvature is around 90. He is really interested in trying something to help with his erections at this time.  March 2017 interval history: The patient attempted a vacuum erection device which failed. He does not want to wait until July for stabilization of his Perrone's plaque. He is very frustrated is not able to obtain an erection. He is interested in obtaining a prosthesis at this time. He has done a significant amount of research online about this.   PMH: Past Medical History  Diagnosis Date  . Pulmonary embolism ([place])   . Anemia 2009    while he was on Warfarin, Aspirin and Plavix. Resolved.   . Blood in stool   . Coronary artery disease   . Hyperlipidemia   . Essential hypertension   . ED  (erectile dysfunction)   . Asthma   . Acid reflux   . Anxiety   . Arthritis   . Clotting disorder ([place])     DVT-leg and lung  . Depression     Surgical History: Past Surgical History  Procedure Laterality Date  . Total hip arthroplasty    . Coronary angioplasty with stent placement  04/2006    Cypher DES to LAD and OM1  . Cardiac catheterization  07/2007    Patent LAD/OM stents. Stable 40% stenosis in proximal RCA  . Nose surgery      Home Medications:    Medication List       This list is accurate as of: 04/23/15  4:11 PM.  Always use your most recent med list.               Alprostadil (Vasodilator) 500 MCG Pllt  Commonly known as:  MUSE  Place 500 mcg into the urethra as needed.     alprostadil 1000 MCG pellet  Commonly known as:  MUSE  1 each (1,000 mcg total) by Transurethral route as needed for erectile dysfunction. use no more than 3 times per week     amLODipine 10 MG tablet  Commonly known as:  NORVASC  TAKE 1 TABLET BY MOUTH EVERY DAY IN THE MORNING     ANADROL-50 50 MG Tabs  Generic drug:  Oxymetholone     aspirin EC 81 MG tablet  Take 81 mg by mouth daily.     Azelastine HCl 0.15 % Soln  1 SPRAY/NARES DAILY     budesonide-formoterol 80-4.5 MCG/ACT inhaler  Commonly known as:  SYMBICORT  Inhale 2 puffs into the lungs 2 (two) times daily.     busPIRone 7.5 MG tablet  Commonly known as:  BUSPAR  Take 7.5 mg by mouth 2 (two) times daily.     clindamycin 300 MG capsule  Commonly known as:  CLEOCIN  Take 2 capsules (600 mg total) by mouth 3 (three) times daily.     clonazePAM 0.5 MG tablet  Commonly known as:  KLONOPIN     CVS POTASSIUM GLUCONATE 2 MEQ Tabs  Generic drug:  Potassium Gluconate  Take by mouth.     cyanocobalamin 1000 MCG tablet  Take 1 tablet (1,000 mcg total) by mouth daily.     cyclobenzaprine 10 MG tablet  Commonly known as:  FLEXERIL  Take 1 tablet by mouth 3 (three) times daily as needed for muscle spasms.      ferrous sulfate 325 (65 FE) MG tablet  Take 325 mg by mouth daily.     fexofenadine 180 MG tablet  Commonly known as:  ALLEGRA  Take 180 mg by mouth daily.     gabapentin 600 MG tablet  Commonly known as:  NEURONTIN  Take 600 mg by mouth 3 (three) times daily.     HYDROcodone-acetaminophen 10-325 MG tablet  Commonly known as:  NORCO  Take 1 tablet by mouth 3 (three) times daily.     omeprazole 20 MG capsule  Commonly known as:  PRILOSEC  Take 1 capsule (20 mg total) by mouth daily.     promethazine 25 MG tablet  Commonly known as:  PHENERGAN  Take 25 mg by mouth every 6 (six) hours as needed for nausea or vomiting.     ramipril 10 MG capsule  Commonly known as:  ALTACE  Take 1 capsule (10 mg total) by mouth daily.     rOPINIRole 0.25 MG tablet  Commonly known as:  REQUIP  Take 0.25 mg by mouth at bedtime.     rosuvastatin 20 MG tablet  Commonly known as:  CRESTOR  Take 20 mg by mouth at bedtime.     sildenafil 100 MG tablet  Commonly known as:  VIAGRA  Take 100 mg by mouth daily as needed for erectile dysfunction. Reported on 03/06/2015     STENDRA 200 MG Tabs  Generic drug:  Avanafil     tiZANidine 2 MG tablet  Commonly known as:  ZANAFLEX  Take 2 mg by mouth at bedtime.     ZETIA 10 MG tablet  Generic drug:  ezetimibe        Allergies: No Known Allergies  Family History: Family History  Problem Relation Age of Onset  . Hypertension Mother   . Kidney cancer Neg Hx   . Kidney disease Neg Hx   . Prostate cancer Neg Hx     Social History:  reports that he quit smoking about 9 years ago. His smoking use included Cigarettes. He has a 45 pack-year smoking history. He has never used smokeless tobacco. He reports that he does not drink alcohol or use illicit drugs.  ROS: UROLOGY Frequent Urination?: No Hard to postpone urination?: No Burning/pain with urination?: No Get up at night to urinate?: No Leakage of urine?: No Urine stream starts and stops?:  No Trouble starting stream?: No Do you have to strain to urinate?:  No Blood in urine?: No Urinary tract infection?: No Sexually transmitted disease?: No Injury to kidneys or bladder?: No Painful intercourse?: No Weak stream?: No Erection problems?: Yes Penile pain?: No  Gastrointestinal Nausea?: No Vomiting?: No Indigestion/heartburn?: No Diarrhea?: No Constipation?: No  Constitutional Fever: No Night sweats?: No Weight loss?: No Fatigue?: No  Skin Skin rash/lesions?: No Itching?: No  Eyes Blurred vision?: No Double vision?: No  Ears/Nose/Throat Sore throat?: No Sinus problems?: No  Hematologic/Lymphatic Swollen glands?: No Easy bruising?: No  Cardiovascular Leg swelling?: No Chest pain?: No  Respiratory Cough?: No Shortness of breath?: No  Endocrine Excessive thirst?: No  Musculoskeletal Back pain?: Yes Joint pain?: Yes  Neurological Headaches?: No Dizziness?: No  Psychologic Depression?: Yes Anxiety?: Yes  Physical Exam: BP 129/77 mmHg  Pulse 75  Ht 6' (1.829 m)  Wt 145 lb (65.772 kg)  BMI 19.66 kg/m2  Constitutional:  Alert and oriented, No acute distress. HEENT: [place] AT, moist mucus membranes.  Trachea midline, no masses. Cardiovascular: No clubbing, cyanosis, or edema. Respiratory: Normal respiratory effort, no increased work of breathing. GI: Abdomen is soft, nontender, nondistended, no abdominal masses GU: No CVA tenderness.  Skin: No rashes, bruises or suspicious lesions. Lymph: No cervical or inguinal adenopathy. Neurologic: Grossly intact, no focal deficits, moving all 4 extremities. Psychiatric: Normal mood and affect.  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 He is at this time requesting aninflatable penile prosthesis. We discussed this procedure very great detail for approximately one half hour. He understands the risks, benefits, indications procedure. He understands that this procedure will result in an never able to obtain a spontaneous erection again. He understands that there is a high risk for failure including with infection. He understands the risks for surrounding iatrogenic injuries to his urethra as well as bowels. He knows this procedure is permanent, though occasionally prosthesis does fail and needs replacement. He also understands the risk for infection. He was given handouts on the AMS 700. All questions were answered and the patient is agreeable to proceeding. We will schedule him for an AMS 700 and 40 penile prosthesis. We will request for the wrap to be present at that time.  2. Erectile dysfunction As above  3. Hypogonadism This is currently managed by his primary care doctor. We will defer management to his PCP at this time  No Follow-up on file.  Nickie Retort, MD  [facility] [address] [place], [place] [address] [phone]

## 2015-04-28 ENCOUNTER — Ambulatory Visit (INDEPENDENT_AMBULATORY_CARE_PROVIDER_SITE_OTHER): Payer: BLUE CROSS/BLUE SHIELD | Admitting: Cardiovascular Disease

## 2015-04-28 ENCOUNTER — Encounter: Payer: Self-pay | Admitting: Cardiovascular Disease

## 2015-04-28 VITALS — BP 110/80 | HR 72 | Ht 72.0 in | Wt 147.0 lb

## 2015-04-28 DIAGNOSIS — I2510 Atherosclerotic heart disease of native coronary artery without angina pectoris: Secondary | ICD-10-CM

## 2015-04-28 DIAGNOSIS — Z01818 Encounter for other preprocedural examination: Secondary | ICD-10-CM | POA: Diagnosis not present

## 2015-04-28 NOTE — Patient Instructions (Signed)
Medication Instructions: No changes.   Labwork: None.   Procedures/Testing: None.   Follow-Up: 1 year with Dr. Arida.   Any Additional Special Instructions Will Be Listed Below (If Applicable).     If you need a refill on your cardiac medications before your next appointment, please call your pharmacy.   

## 2015-04-28 NOTE — Progress Notes (Signed)
Cardiology Office Note   Date:  04/28/2015   ID:  Gerald Jefferson, DOB 02/18/1955, MRN [id number]  PCP:  Gerald Napoleon, MD  Cardiologist:   Gerald Sacramento, MD   Chief Complaint  Patient presents with  . other    Cardiac clearance no complaints today. Meds reviewed verbally with pt.      History of Present Illness: Gerald Jefferson is a 59 y.o. male who presents for A follow-up visit regarding coronary artery disease. He has known history of coronary artery disease status post angioplasty and drug-eluting stent placement to the LAD as well as OM in 2008. Later that year, he was diagnosed with pulmonary embolism and was treated with warfarin. While he was on triple therapy, he had GI bleed from unknown source. He had an IVC filter placed. His Plavix was discontinued after a year of treatment. Also he was taken off warfarin after completion of treatment.  Cardiac catheterization in 2009 showed patent stents and mild disease in the right coronary artery. Since then, the patient has not had any new cardiac events. His most recent stress test and echocardiogram were done in October of 2011. Both of them were unremarkable.  He continues to do well. He denies any chest pain, dyspnea or palpitations.  His biggest issue continues to be erectile dysfunction which has led to significant problems with his personal life. He is now separated. He is considering penile implant.   Past Medical History  Diagnosis Date  . Pulmonary embolism ([place])   . Anemia 2009    while he was on Warfarin, Aspirin and Plavix. Resolved.   . Blood in stool   . Coronary artery disease   . Hyperlipidemia   . Essential hypertension   . ED (erectile dysfunction)   . Asthma   . Acid reflux   . Anxiety   . Arthritis   . Clotting disorder ([place])     DVT-leg and lung  . Depression     Past Surgical History  Procedure Laterality Date  . Total hip arthroplasty    . Coronary angioplasty with stent  placement  04/2006    Cypher DES to LAD and OM1  . Cardiac catheterization  07/2007    Patent LAD/OM stents. Stable 40% stenosis in proximal RCA  . Nose surgery       Current Outpatient Prescriptions  Medication Sig Dispense Refill  . alprostadil (MUSE) 1000 MCG pellet 1 each (1,000 mcg total) by Transurethral route as needed for erectile dysfunction. use no more than 3 times per week 6 each 11  . Alprostadil, Vasodilator, (MUSE) 500 MCG PLLT Place 500 mcg into the urethra as needed. 30 each 11  . amLODipine (NORVASC) 10 MG tablet TAKE 1 TABLET BY MOUTH EVERY DAY IN THE MORNING  0  . ANADROL-50 50 MG TABS     . aspirin EC 81 MG tablet Take 81 mg by mouth daily.    . Avanafil (STENDRA) 200 MG TABS     . Azelastine HCl 0.15 % SOLN 1 SPRAY/NARES DAILY  0  . budesonide-formoterol (SYMBICORT) 80-4.5 MCG/ACT inhaler Inhale 2 puffs into the lungs 2 (two) times daily.    . busPIRone (BUSPAR) 7.5 MG tablet Take 7.5 mg by mouth 2 (two) times daily.  3  . clindamycin (CLEOCIN) 300 MG capsule Take 2 capsules (600 mg total) by mouth 3 (three) times daily. 60 capsule 0  . clonazePAM (KLONOPIN) 0.5 MG tablet     . cyclobenzaprine (FLEXERIL)  10 MG tablet Take 1 tablet by mouth 3 (three) times daily as needed for muscle spasms.     Marland Kitchen ezetimibe (ZETIA) 10 MG tablet     . ferrous sulfate 325 (65 FE) MG tablet Take 325 mg by mouth daily.     . fexofenadine (ALLEGRA) 180 MG tablet Take 180 mg by mouth daily.    Marland Kitchen gabapentin (NEURONTIN) 600 MG tablet Take 600 mg by mouth 3 (three) times daily.    Marland Kitchen HYDROcodone-acetaminophen (NORCO) 10-325 MG per tablet Take 1 tablet by mouth 3 (three) times daily.    Marland Kitchen omeprazole (PRILOSEC) 20 MG capsule Take 1 capsule (20 mg total) by mouth daily. 30 capsule 6  . Potassium Gluconate ([employer] POTASSIUM GLUCONATE) 2 MEQ TABS Take by mouth.    . promethazine (PHENERGAN) 25 MG tablet Take 25 mg by mouth every 6 (six) hours as needed for nausea or vomiting.    . ramipril (ALTACE) 10  MG capsule Take 1 capsule (10 mg total) by mouth daily. 30 capsule 11  . rOPINIRole (REQUIP) 0.25 MG tablet Take 0.25 mg by mouth at bedtime.    . rosuvastatin (CRESTOR) 20 MG tablet Take 20 mg by mouth at bedtime.     . sildenafil (VIAGRA) 100 MG tablet Take 100 mg by mouth daily as needed for erectile dysfunction. Reported on 03/06/2015    . tiZANidine (ZANAFLEX) 2 MG tablet Take 2 mg by mouth at bedtime.      . vitamin B-12 1000 MCG tablet Take 1 tablet (1,000 mcg total) by mouth daily. 30 tablet 1   No current facility-administered medications for this visit.    Allergies:   Review of patient's allergies indicates no known allergies.    Social History:  The patient  reports that he quit smoking about 9 years ago. His smoking use included Cigarettes. He has a 45 pack-year smoking history. He has never used smokeless tobacco. He reports that he does not drink alcohol or use illicit drugs.   Family History:  The patient's family history includes Hypertension in his mother. There is no history of Kidney cancer, Kidney disease, or Prostate cancer.    ROS:  Please see the history of present illness.   Otherwise, review of systems are positive for none.   All other systems are reviewed and negative.    PHYSICAL EXAM: VS:  BP 110/80 mmHg  Pulse 72  Ht 6' (1.829 m)  Wt 147 lb (66.679 kg)  BMI 19.93 kg/m2 , BMI Body mass index is 19.93 kg/(m^2). GEN: Well nourished, well developed, in no acute distress HEENT: normal Neck: no JVD, carotid bruits, or masses Cardiac: RRR; no murmurs, rubs, or gallops,no edema  Respiratory:  clear to auscultation bilaterally, normal work of breathing GI: soft, nontender, nondistended, + BS MS: no deformity or atrophy Skin: warm and dry, no rash Neuro:  Strength and sensation are intact Psych: euthymic mood, full affect   EKG:  EKG is ordered today. The ekg ordered today demonstrates normal sinus rhythm with no significant ST or T wave  changes.   Recent Labs: 10/27/2014: ALT 11* 10/30/2014: BUN 7; Creatinine, Ser 0.87; Hemoglobin 12.3*; Platelets 256; Potassium 4.6; Sodium 142    Lipid Panel No results found for: CHOL, TRIG, HDL, CHOLHDL, VLDL, LDLCALC, LDLDIRECT    Wt Readings from Last 3 Encounters:  04/28/15 147 lb (66.679 kg)  04/23/15 145 lb (65.772 kg)  03/06/15 164 lb 1.6 oz (74.435 kg)  ASSESSMENT AND PLAN:  1.  Coronary artery disease involving native coronary arteries without angina the patient is overall doing very well with no anginal symptoms. His EKG is normal. Recommend continuing medical therapy. If the patient decides to go penile implant, I do not recommend any ischemic cardiac evaluation. He would be considered at an overall low risk. Aspirin can be held 5-7 days before surgery if needed and should be resumed after.  2. Essential hypertension: Blood pressure is well controlled on current medications.  3. Hyperlipidemia: Continue treatment with Zetia and rosuvastatin. This is being followed by Dr. Elijio Miles     Disposition:   FU with me in 1 year  Signed,  Gerald Sacramento, MD  04/28/2015 5:40 PM    [place]

## 2015-04-29 ENCOUNTER — Telehealth: Payer: Self-pay | Admitting: Radiology

## 2015-04-29 NOTE — Telephone Encounter (Signed)
Notified pt of surgery scheduled 05/20/15, pre-admit testing appt on 3/30 @ 8:15 & to call day prior to surgery for arrival time to [facility]. Pt advised to hold ASA 81mg  7 days prior to surgery beginning on 05/13/15 per Dr Fletcher Anon. Pt voices understanding. Mailed Surgery & Pre-Admission Testing Information Sheet to pt's home address.

## 2015-04-30 ENCOUNTER — Telehealth: Payer: Self-pay | Admitting: Cardiovascular Disease

## 2015-04-30 NOTE — Telephone Encounter (Signed)
Faxed clearance to [facility], [phone] Pt to have insertion of inflatable penile prosthesis, date TBD

## 2015-05-04 ENCOUNTER — Encounter: Payer: Self-pay | Admitting: Medical Oncology

## 2015-05-04 ENCOUNTER — Emergency Department
Admission: EM | Admit: 2015-05-04 | Discharge: 2015-05-04 | Disposition: A | Discharge status: Home / Self Care | Payer: BLUE CROSS/BLUE SHIELD | Attending: Student | Admitting: Student

## 2015-05-04 DIAGNOSIS — H6122 Impacted cerumen, left ear: Secondary | ICD-10-CM | POA: Diagnosis not present

## 2015-05-04 DIAGNOSIS — Z7982 Long term (current) use of aspirin: Secondary | ICD-10-CM | POA: Diagnosis not present

## 2015-05-04 DIAGNOSIS — Z79899 Other long term (current) drug therapy: Secondary | ICD-10-CM | POA: Diagnosis not present

## 2015-05-04 DIAGNOSIS — I10 Essential (primary) hypertension: Secondary | ICD-10-CM | POA: Diagnosis not present

## 2015-05-04 DIAGNOSIS — Z7951 Long term (current) use of inhaled steroids: Secondary | ICD-10-CM | POA: Diagnosis not present

## 2015-05-04 DIAGNOSIS — H9202 Otalgia, left ear: Secondary | ICD-10-CM | POA: Diagnosis present

## 2015-05-04 DIAGNOSIS — Z87891 Personal history of nicotine dependence: Secondary | ICD-10-CM | POA: Insufficient documentation

## 2015-05-04 DIAGNOSIS — Z792 Long term (current) use of antibiotics: Secondary | ICD-10-CM | POA: Insufficient documentation

## 2015-05-04 MED ORDER — CARBAMIDE PEROXIDE 6.5 % OT SOLN: 5.0000 [drp] | Freq: Two times a day (BID) | OTIC | Status: DC

## 2015-05-04 NOTE — ED Notes (Signed)
Left ear irrigated with warm water/h2o2  Very small amount to wax removed  PA arware

## 2015-05-04 NOTE — ED Notes (Signed)
Pt reports his left ear has been clogged up and painful since Friday. Denies fever.

## 2015-05-04 NOTE — ED Notes (Signed)
Left ear pain for the past 3 days   No fever or drainage

## 2015-05-04 NOTE — Discharge Instructions (Signed)
Cerumen Impaction The structures of the external ear canal secrete a waxy substance known as cerumen. Excess cerumen can build up in the ear canal, causing a condition known as cerumen impaction. Cerumen impaction can cause ear pain and disrupt the function of the ear. The rate of cerumen production differs for each individual. In certain individuals, the configuration of the ear canal may decrease his or her ability to naturally remove cerumen. CAUSES Cerumen impaction is caused by excessive cerumen production or buildup. RISK FACTORS  Frequent use of swabs to clean ears.  Having narrow ear canals.  Having eczema.  Being dehydrated. SIGNS AND SYMPTOMS  Diminished hearing.  Ear drainage.  Ear pain.  Ear itch. TREATMENT Treatment may involve:  Over-the-counter or prescription ear drops to soften the cerumen.  Removal of cerumen by a health care provider. This may be done with:  Irrigation with warm water. This is the most common method of removal.  Ear curettes and other instruments.  Surgery. This may be done in severe cases. HOME CARE INSTRUCTIONS  Take medicines only as directed by your health care provider.  Do not insert objects into the ear with the intent of cleaning the ear. PREVENTION  Do not insert objects into the ear, even with the intent of cleaning the ear. Removing cerumen as a part of normal hygiene is not necessary, and the use of swabs in the ear canal is not recommended.  Drink enough water to keep your urine clear or pale yellow.  Control your eczema if you have it. SEEK MEDICAL CARE IF:  You develop ear pain.  You develop bleeding from the ear.  The cerumen does not clear after you use ear drops as directed.   This information is not intended to replace advice given to you by your health care provider. Make sure you discuss any questions you have with your health care provider.   Document Released: 03/03/2004 Document Revised: 02/14/2014  Document Reviewed: 09/10/2014 Elsevier Interactive Patient Education 2016 [employer]  

## 2015-05-04 NOTE — ED Provider Notes (Signed)
[facility] Emergency Department Provider Note  ____________________________________________  Time seen: Approximately 10:11 AM  I have reviewed the triage vital signs and the nursing notes.   HISTORY  Chief Complaint Otalgia    HPI Gerald Jefferson is a 60 y.o. male  presents for evaluation of left ear pain for 3 days. Patient states that he have marked time hearing out of that ear. Denies any trauma or discharge.   Past Medical History  Diagnosis Date  . Pulmonary embolism ([place])   . Anemia 2009    while he was on Warfarin, Aspirin and Plavix. Resolved.   . Blood in stool   . Coronary artery disease   . Hyperlipidemia   . Essential hypertension   . ED (erectile dysfunction)   . Asthma   . Acid reflux   . Anxiety   . Arthritis   . Clotting disorder ([place])     DVT-leg and lung  . Depression     Patient Active Problem List   Diagnosis Date Noted  . Myositis 10/27/2014  . Myofasciitis 10/27/2014  . Renal insufficiency 10/27/2014  . Leukocytosis 10/27/2014  . Polyneuropathy ([place]) 08/26/2014  . Hip pain 03/12/2012  . Coronary artery disease   . Hyperlipidemia   . Essential hypertension   . ED (erectile dysfunction)     Past Surgical History  Procedure Laterality Date  . Total hip arthroplasty    . Coronary angioplasty with stent placement  04/2006    Cypher DES to LAD and OM1  . Cardiac catheterization  07/2007    Patent LAD/OM stents. Stable 40% stenosis in proximal RCA  . Nose surgery      Current Outpatient Rx  Name  Route  Sig  Dispense  Refill  . alprostadil (MUSE) 1000 MCG pellet   Transurethral   1 each (1,000 mcg total) by Transurethral route as needed for erectile dysfunction. use no more than 3 times per week   6 each   11   . Alprostadil, Vasodilator, (MUSE) 500 MCG PLLT   Urethral   Place 500 mcg into the urethra as needed.   30 each   11   . amLODipine (NORVASC) 10 MG tablet      TAKE 1 TABLET BY MOUTH EVERY  DAY IN THE MORNING      0   . ANADROL-50 50 MG TABS                 Dispense as written.   Marland Kitchen aspirin EC 81 MG tablet   Oral   Take 81 mg by mouth daily.         . Avanafil (STENDRA) 200 MG TABS               . Azelastine HCl 0.15 % SOLN      1 SPRAY/NARES DAILY      0   . budesonide-formoterol (SYMBICORT) 80-4.5 MCG/ACT inhaler   Inhalation   Inhale 2 puffs into the lungs 2 (two) times daily.         . busPIRone (BUSPAR) 7.5 MG tablet   Oral   Take 7.5 mg by mouth 2 (two) times daily.      3   . carbamide peroxide (DEBROX) 6.5 % otic solution   Both Ears   Place 5 drops into both ears 2 (two) times daily.   15 mL   2   . clindamycin (CLEOCIN) 300 MG capsule   Oral   Take 2 capsules (600  mg total) by mouth 3 (three) times daily.   60 capsule   0   . clonazePAM (KLONOPIN) 0.5 MG tablet               . cyclobenzaprine (FLEXERIL) 10 MG tablet   Oral   Take 1 tablet by mouth 3 (three) times daily as needed for muscle spasms.          Marland Kitchen ezetimibe (ZETIA) 10 MG tablet               . ferrous sulfate 325 (65 FE) MG tablet   Oral   Take 325 mg by mouth daily.          . fexofenadine (ALLEGRA) 180 MG tablet   Oral   Take 180 mg by mouth daily.         Marland Kitchen gabapentin (NEURONTIN) 600 MG tablet   Oral   Take 600 mg by mouth 3 (three) times daily.         Marland Kitchen HYDROcodone-acetaminophen (NORCO) 10-325 MG per tablet   Oral   Take 1 tablet by mouth 3 (three) times daily.         Marland Kitchen omeprazole (PRILOSEC) 20 MG capsule   Oral   Take 1 capsule (20 mg total) by mouth daily.   30 capsule   6   . Potassium Gluconate ([employer]S POTASSIUM GLUCONATE) 2 MEQ TABS   Oral   Take by mouth.         . promethazine (PHENERGAN) 25 MG tablet   Oral   Take 25 mg by mouth every 6 (six) hours as needed for nausea or vomiting.         . ramipril (ALTACE) 10 MG capsule   Oral   Take 1 capsule (10 mg total) by mouth daily.   30 capsule   11   .  rOPINIRole (REQUIP) 0.25 MG tablet   Oral   Take 0.25 mg by mouth at bedtime.         . rosuvastatin (CRESTOR) 20 MG tablet   Oral   Take 20 mg by mouth at bedtime.          . sildenafil (VIAGRA) 100 MG tablet   Oral   Take 100 mg by mouth daily as needed for erectile dysfunction. Reported on 03/06/2015         . tiZANidine (ZANAFLEX) 2 MG tablet   Oral   Take 2 mg by mouth at bedtime.           . vitamin B-12 1000 MCG tablet   Oral   Take 1 tablet (1,000 mcg total) by mouth daily.   30 tablet   1     Allergies Review of patient's allergies indicates no known allergies.  Family History  Problem Relation Age of Onset  . Hypertension Mother   . Kidney cancer Neg Hx   . Kidney disease Neg Hx   . Prostate cancer Neg Hx     Social History Social History  Substance Use Topics  . Smoking status: Former Smoker -- 1.50 packs/day for 30 years    Types: Cigarettes    Quit date: 04/08/2006  . Smokeless tobacco: Never Used  . Alcohol Use: No    Review of Systems Constitutional: No fever/chills ENT: No sore throat. Positive left ear with decreased hearing. Cardiovascular: Denies chest pain. Respiratory: Denies shortness of breath. Musculoskeletal: Negative for back pain. Skin: Negative for rash. Neurological: Negative for headaches, focal weakness or numbness.  10-point  ROS otherwise negative.  ____________________________________________   PHYSICAL EXAM:  VITAL SIGNS: ED Triage Vitals  Enc Vitals Group     BP 05/04/15 0835 90/62 mmHg     Pulse Rate 05/04/15 0835 77     Resp 05/04/15 0835 18     Temp 05/04/15 0835 97.5 F (36.4 C)     Temp Source 05/04/15 0835 Oral     SpO2 05/04/15 0835 96 %     Weight 05/04/15 0835 145 lb (65.772 kg)     Height 05/04/15 0835 6' (1.829 m)     Head Cir --      Peak Flow --      Pain Score 05/04/15 0835 5     Pain Loc --      Pain Edu? --      Excl. in [place]? --     Constitutional: Alert and oriented. Well  appearing and in no acute distress. Head: Atraumatic. Bilateral cerumen impaction noted to both ears. Nose: No congestion/rhinnorhea. Mouth/Throat: Mucous membranes are moist.  Oropharynx non-erythematous. Neck: No stridor.   Cardiovascular: Normal rate, regular rhythm. Grossly normal heart sounds.  Good peripheral circulation. Respiratory: Normal respiratory effort.  No retractions. Lungs CTAB. Skin:  Skin is warm, dry and intact. No rash noted. Psychiatric: Mood and affect are normal. Speech and behavior are normal.  ____________________________________________   LABS (all labs ordered are listed, but only abnormal results are displayed)  Labs Reviewed - No data to display ____________________________________________     PROCEDURES  Procedure(s) performed: None  Critical Care performed: No  ____________________________________________   INITIAL IMPRESSION / ASSESSMENT AND PLAN / ED COURSE  Pertinent labs & imaging results that were available during my care of the patient were reviewed by me and considered in my medical decision making (see chart for details).  Cerumen impaction bilaterally unable to clear completely here in the ED. Rx given for deep rocks encouraged to return in 4 days to his PCP or go to ENT on-call. ____________________________________________   FINAL CLINICAL IMPRESSION(S) / ED DIAGNOSES  Final diagnoses:  Cerumen impaction, left     This chart was dictated using voice recognition software/Dragon. Despite best efforts to proofread, errors can occur which can change the meaning. Any change was purely unintentional.   Arlyss Repress, PA-C 05/04/15 1635  Joanne Gavel, MD 05/05/15 2135

## 2015-05-07 ENCOUNTER — Encounter
Admission: RE | Admit: 2015-05-07 | Discharge: 2015-05-07 | Disposition: A | Discharge status: Home / Self Care | Payer: BLUE CROSS/BLUE SHIELD | Source: Ambulatory Visit | Attending: Urology | Admitting: Urology

## 2015-05-07 DIAGNOSIS — Z01812 Encounter for preprocedural laboratory examination: Secondary | ICD-10-CM | POA: Insufficient documentation

## 2015-05-07 HISTORY — DX: Restless legs syndrome: G25.81

## 2015-05-07 HISTORY — DX: Dorsalgia, unspecified: M54.9

## 2015-05-07 LAB — BASIC METABOLIC PANEL
Anion gap: 3 — ABNORMAL LOW (ref 5–15)
BUN: 22 mg/dL — AB (ref 6–20)
CALCIUM: 9.4 mg/dL (ref 8.9–10.3)
CO2: 27 mmol/L (ref 22–32)
CREATININE: 0.91 mg/dL (ref 0.61–1.24)
Chloride: 110 mmol/L (ref 101–111)
GFR calc Af Amer: >60 mL/min (ref >60)
GLUCOSE: 92 mg/dL (ref 65–99)
POTASSIUM: 4.4 mmol/L (ref 3.5–5.1)
SODIUM: 140 mmol/L (ref 135–145)

## 2015-05-07 LAB — CBC
HCT: 40.3 % (ref 40.0–52.0)
Hemoglobin: 13.7 g/dL (ref 13.0–18.0)
MCH: 30 pg (ref 26.0–34.0)
MCHC: 34 g/dL (ref 32.0–36.0)
MCV: 88.5 fL (ref 80.0–100.0)
PLATELETS: 208 10*3/uL (ref 150–440)
RBC: 4.55 MIL/uL (ref 4.40–5.90)
RDW: 13.7 % (ref 11.5–14.5)
WBC: 5.5 10*3/uL (ref 3.8–10.6)

## 2015-05-07 LAB — PROTIME-INR
INR: 1.02
PROTHROMBIN TIME: 13.6 s (ref 11.4–15.0)

## 2015-05-07 LAB — APTT: aPTT: 36 seconds (ref 24–36)

## 2015-05-07 NOTE — Pre-Procedure Instructions (Signed)
Gerald Rutherford, MD at 04/23/2015 4:11 PM     Status: Signed       Expand All Collapse All     04/23/2015 4:11 PM   [place] April 10, 1955 [id number]  Referring provider: [name], MD [address] [place],  [address]  Chief Complaint  Patient presents with  . Erectile Dysfunction    HPI: The patient is a 60 year old gentleman who presents with a three-month history of a crooked erection. 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 He notes that it is not painful. However, he did not have this problem prior to 3 months ago. He also has erectile dysfunction he has tried Cialis, Viagra, and possibly Levitra which do not give him a sustainable erection for intercourse. He is interested in trying a different medication. He also has hypogonadism and is being treated for this by his primary care doctor. He does get his screening blood work on a regular basis. He had a 2 cm plaque in the left corporal body on exam.  January 2017 interval history: The patient is been using Muse 1000 g when necessary. He says he is able to obtain somewhat erection but is not able to complete intercourse. He is asking for other options. He is in active phase of peroneal disease in this time. His curvature is new. He is not having pain though at this time. He cancurvature is around 90. He is really interested in trying something to help with his erections at this time.  March 2017 interval history: The patient attempted a vacuum erection device which failed. He does not want to wait until July for stabilization of his Perrone's plaque. He is very frustrated is not able to obtain an erection. He is interested in obtaining a prosthesis at this time. He has done a significant amount of research online about this.   PMH: Past Medical History  Diagnosis Date  . Pulmonary embolism ([place])   . Anemia 2009    while he was on Warfarin, Aspirin and Plavix.  Resolved.   . Blood in stool   . Coronary artery disease   . Hyperlipidemia   . Essential hypertension   . ED (erectile dysfunction)   . Asthma   . Acid reflux   . Anxiety   . Arthritis   . Clotting disorder ([place])     DVT-leg and lung  . Depression     Surgical History: Past Surgical History  Procedure Laterality Date  . Total hip arthroplasty    . Coronary angioplasty with stent placement  04/2006    Cypher DES to LAD and OM1  . Cardiac catheterization  07/2007    Patent LAD/OM stents. Stable 40% stenosis in proximal RCA  . Nose surgery      Home Medications:    Medication List       This list is accurate as of: 04/23/15 4:11 PM. Always use your most recent med list.              Alprostadil (Vasodilator) 500 MCG Pllt  Commonly known as: MUSE  Place 500 mcg into the urethra as needed.     alprostadil 1000 MCG pellet  Commonly known as: MUSE  1 each (1,000 mcg total) by Transurethral route as needed for erectile dysfunction. use no more than 3 times per week     amLODipine 10 MG tablet  Commonly known as: NORVASC  TAKE 1 TABLET BY MOUTH EVERY DAY IN THE MORNING  ANADROL-50 50 MG Tabs  Generic drug: Oxymetholone     aspirin EC 81 MG tablet  Take 81 mg by mouth daily.     Azelastine HCl 0.15 % Soln  1 SPRAY/NARES DAILY     budesonide-formoterol 80-4.5 MCG/ACT inhaler  Commonly known as: SYMBICORT  Inhale 2 puffs into the lungs 2 (two) times daily.     busPIRone 7.5 MG tablet  Commonly known as: BUSPAR  Take 7.5 mg by mouth 2 (two) times daily.     clindamycin 300 MG capsule  Commonly known as: CLEOCIN  Take 2 capsules (600 mg total) by mouth 3 (three) times daily.     clonazePAM 0.5 MG tablet  Commonly known as: KLONOPIN     [employer] POTASSIUM GLUCONATE 2 MEQ Tabs  Generic drug: Potassium Gluconate  Take by  mouth.     cyanocobalamin 1000 MCG tablet  Take 1 tablet (1,000 mcg total) by mouth daily.     cyclobenzaprine 10 MG tablet  Commonly known as: FLEXERIL  Take 1 tablet by mouth 3 (three) times daily as needed for muscle spasms.     ferrous sulfate 325 (65 FE) MG tablet  Take 325 mg by mouth daily.     fexofenadine 180 MG tablet  Commonly known as: ALLEGRA  Take 180 mg by mouth daily.     gabapentin 600 MG tablet  Commonly known as: NEURONTIN  Take 600 mg by mouth 3 (three) times daily.     HYDROcodone-acetaminophen 10-325 MG tablet  Commonly known as: NORCO  Take 1 tablet by mouth 3 (three) times daily.     omeprazole 20 MG capsule  Commonly known as: PRILOSEC  Take 1 capsule (20 mg total) by mouth daily.     promethazine 25 MG tablet  Commonly known as: PHENERGAN  Take 25 mg by mouth every 6 (six) hours as needed for nausea or vomiting.     ramipril 10 MG capsule  Commonly known as: ALTACE  Take 1 capsule (10 mg total) by mouth daily.     rOPINIRole 0.25 MG tablet  Commonly known as: REQUIP  Take 0.25 mg by mouth at bedtime.     rosuvastatin 20 MG tablet  Commonly known as: CRESTOR  Take 20 mg by mouth at bedtime.     sildenafil 100 MG tablet  Commonly known as: VIAGRA  Take 100 mg by mouth daily as needed for erectile dysfunction. Reported on 03/06/2015     STENDRA 200 MG Tabs  Generic drug: Avanafil     tiZANidine 2 MG tablet  Commonly known as: ZANAFLEX  Take 2 mg by mouth at bedtime.     ZETIA 10 MG tablet  Generic drug: ezetimibe        Allergies: No Known Allergies  Family History: Family History  Problem Relation Age of Onset  . Hypertension Mother   . Kidney cancer Neg Hx   . Kidney disease Neg Hx   . Prostate cancer Neg Hx     Social History:  reports that he quit smoking about 9 years ago. His smoking use included Cigarettes. He  has a 45 pack-year smoking history. He has never used smokeless tobacco. He reports that he does not drink alcohol or use illicit drugs.  ROS: UROLOGY Frequent Urination?: No Hard to postpone urination?: No Burning/pain with urination?: No Get up at night to urinate?: No Leakage of urine?: No Urine stream starts and stops?: No Trouble starting stream?: No Do you have to strain to urinate?:  No Blood in urine?: No Urinary tract infection?: No Sexually transmitted disease?: No Injury to kidneys or bladder?: No Painful intercourse?: No Weak stream?: No Erection problems?: Yes Penile pain?: No  Gastrointestinal Nausea?: No Vomiting?: No Indigestion/heartburn?: No Diarrhea?: No Constipation?: No  Constitutional Fever: No Night sweats?: No Weight loss?: No Fatigue?: No  Skin Skin rash/lesions?: No Itching?: No  Eyes Blurred vision?: No Double vision?: No  Ears/Nose/Throat Sore throat?: No Sinus problems?: No  Hematologic/Lymphatic Swollen glands?: No Easy bruising?: No  Cardiovascular Leg swelling?: No Chest pain?: No  Respiratory Cough?: No Shortness of breath?: No  Endocrine Excessive thirst?: No  Musculoskeletal Back pain?: Yes Joint pain?: Yes  Neurological Headaches?: No Dizziness?: No  Psychologic Depression?: Yes Anxiety?: Yes  Physical Exam: BP 129/77 mmHg  Pulse 75  Ht 6' (1.829 m)  Wt 145 lb (65.772 kg)  BMI 19.66 kg/m2  Constitutional: Alert and oriented, No acute distress. HEENT: [place] AT, moist mucus membranes. Trachea midline, no masses. Cardiovascular: No clubbing, cyanosis, or edema. Respiratory: Normal respiratory effort, no increased work of breathing. GI: Abdomen is soft, nontender, nondistended, no abdominal masses GU: No CVA tenderness.  Skin: No rashes, bruises or suspicious lesions. Lymph: No cervical or inguinal adenopathy. Neurologic: Grossly intact, no focal deficits, moving all 4 extremities. Psychiatric:  Normal mood and affect.  Laboratory Data:  Recent Labs    Lab Results  Component Value Date   WBC 5.4 10/30/2014   HGB 12.3* 10/30/2014   HCT 36.7* 10/30/2014   MCV 93.5 10/30/2014   PLT 256 10/30/2014       Recent Labs    Lab Results  Component Value Date   CREATININE 0.87 10/30/2014       Recent Labs    No results found for: PSA     Recent Labs    No results found for: TESTOSTERONE     Recent Labs    No results found for: HGBA1C    Urinalysis  Labs (Brief)    No results found for: COLORURINE, APPEARANCEUR, LABSPEC, PHURINE, GLUCOSEU, HGBUR, BILIRUBINUR, KETONESUR, PROTEINUR, UROBILINOGEN, NITRITE, LEUKOCYTESUR      Assessment & Plan:   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 He is at this time requesting aninflatable penile prosthesis. We discussed this procedure very great detail for approximately one half hour. He understands the risks, benefits, indications procedure. He understands that this procedure will result in an never able to obtain a spontaneous erection again. He understands that there is a high risk for failure including with infection. He understands the risks for surrounding iatrogenic injuries to his urethra as well as bowels. He knows this procedure is permanent, though occasionally prosthesis does fail and needs replacement. He also understands the risk for infection. He was given handouts on the AMS 700. All questions were answered and the patient is agreeable to proceeding. We will schedule him for an AMS 700 and 40 penile prosthesis. We will request for the wrap to be present at that time.  2. Erectile dysfunction As above  3. Hypogonadism This is currently managed by his primary  care doctor. We will defer management to his PCP at this time  No Follow-up on file.  Nickie Retort, MD  [facility] [address] [place], [place] [address] [phone]               Psych Notes     No notes of this type exist for this encounter.

## 2015-05-07 NOTE — Patient Instructions (Signed)
  Your procedure is scheduled on: 05/20/15 Wed  Report to Day Surgery.2nd floor medical  To find out your arrival time please call [phone] between 1PM - 3PM on 4/11/17Tues Remember: Instructions that are not followed completely may result in serious medical risk, up to and including death, or upon the discretion of your surgeon and anesthesiologist your surgery may need to be rescheduled.    _x___ 1. Do not eat food or drink liquids after midnight. No gum chewing or hard candies.     ____ 2. No Alcohol for 24 hours before or after surgery.   ____ 3. Bring all medications with you on the day of surgery if instructed.    __x__ 4. Notify your doctor if there is any change in your medical condition     (cold, fever, infections).     Do not wear jewelry, make-up, hairpins, clips or nail polish.  Do not wear lotions, powders, or perfumes. You may wear deodorant.  Do not shave 48 hours prior to surgery. Men may shave face and neck.  Do not bring valuables to the hospital.    [facility] is not responsible for any belongings or valuables.               Contacts, dentures or bridgework may not be worn into surgery.  Leave your suitcase in the car. After surgery it may be brought to your room.  For patients admitted to the hospital, discharge time is determined by your                treatment team.   Patients discharged the day of surgery will not be allowed to drive home.   Please read over the following fact sheets that you were given:      _x___ Take these medicines the morning of surgery with A SIP OF WATER:    1. amLODipine (NORVASC) 10 MG tablet  2. budesonide-formoterol (SYMBICORT) 80-4.5 MCG/ACT inhaler  3. busPIRone (BUSPAR) 7.5 MG tablet  4.gabapentin (NEURONTIN) 600 MG tablet   5.HYDROcodone-acetaminophen (NORCO) 10-325 MG per tablet if needed  6.omeprazole (PRILOSEC) 20 MG capsule  7.ramipril (ALTACE) 10 MG capsule  ____ Fleet Enema (as directed)   ____ Use CHG Soap  as directed  _x_ Use inhalers on the day of surgery  ____ Stop metformin 2 days prior to surgery    ____ Take 1/2 of usual insulin dose the night before surgery and none on the morning of surgery.   _x___ Stop Coumadin/Plavix/aspirin on Stop  Aspirin 5 days before surgery per Dr Fletcher Anon  ____ Stop Anti-inflammatories on    ____ Stop supplements until after surgery.    ____ Bring C-Pap to the hospital.

## 2015-05-07 NOTE — Pre-Procedure Instructions (Addendum)
Author Note Status Last Update User Last Update Date/Time    Wellington Hampshire, MD Signed Wellington Hampshire, MD 04/28/2015 5:49 PM    Progress Notes    Expand All Collapse All     CARDIAC CLEARANCE Dr Fletcher Anon 04/28/15    Cardiology Office Note   Date: 04/28/2015   ID: [name], DOB Aug 24, 1955, MRN [id number]  PCP: Volanda Napoleon, MD Cardiologist: Kathlyn Sacramento, MD   Chief Complaint  Patient presents with  . other    Cardiac clearance no complaints today. Meds reviewed verbally with pt.     History of Present Illness: Gerald Jefferson is a 60 y.o. male who presents for A follow-up visit regarding coronary artery disease. He has known history of coronary artery disease status post angioplasty and drug-eluting stent placement to the LAD as well as OM in 2008. Later that year, he was diagnosed with pulmonary embolism and was treated with warfarin. While he was on triple therapy, he had GI bleed from unknown source. He had an IVC filter placed. His Plavix was discontinued after a year of treatment. Also he was taken off warfarin after completion of treatment.  Cardiac catheterization in 2009 showed patent stents and mild disease in the right coronary artery. Since then, the patient has not had any new cardiac events. His most recent stress test and echocardiogram were done in October of 2011. Both of them were unremarkable.  He continues to do well. He denies any chest pain, dyspnea or palpitations.  His biggest issue continues to be erectile dysfunction which has led to significant problems with his personal life. He is now separated. He is considering penile implant.   Past Medical History  Diagnosis Date  . Pulmonary embolism ([place])   . Anemia 2009    while he was on Warfarin, Aspirin and Plavix. Resolved.   . Blood in stool   . Coronary artery disease   . Hyperlipidemia   . Essential hypertension   . ED  (erectile dysfunction)   . Asthma   . Acid reflux   . Anxiety   . Arthritis   . Clotting disorder ([place])     DVT-leg and lung  . Depression     Past Surgical History  Procedure Laterality Date  . Total hip arthroplasty    . Coronary angioplasty with stent placement  04/2006    Cypher DES to LAD and OM1  . Cardiac catheterization  07/2007    Patent LAD/OM stents. Stable 40% stenosis in proximal RCA  . Nose surgery       Current Outpatient Prescriptions  Medication Sig Dispense Refill  . alprostadil (MUSE) 1000 MCG pellet 1 each (1,000 mcg total) by Transurethral route as needed for erectile dysfunction. use no more than 3 times per week 6 each 11  . Alprostadil, Vasodilator, (MUSE) 500 MCG PLLT Place 500 mcg into the urethra as needed. 30 each 11  . amLODipine (NORVASC) 10 MG tablet TAKE 1 TABLET BY MOUTH EVERY DAY IN THE MORNING  0  . ANADROL-50 50 MG TABS     . aspirin EC 81 MG tablet Take 81 mg by mouth daily.    . Avanafil (STENDRA) 200 MG TABS     . Azelastine HCl 0.15 % SOLN 1 SPRAY/NARES DAILY  0  . budesonide-formoterol (SYMBICORT) 80-4.5 MCG/ACT inhaler Inhale 2 puffs into the lungs 2 (two) times daily.    . busPIRone (BUSPAR) 7.5 MG tablet Take 7.5 mg by mouth 2 (two) times  daily.  3  . clindamycin (CLEOCIN) 300 MG capsule Take 2 capsules (600 mg total) by mouth 3 (three) times daily. 60 capsule 0  . clonazePAM (KLONOPIN) 0.5 MG tablet     . cyclobenzaprine (FLEXERIL) 10 MG tablet Take 1 tablet by mouth 3 (three) times daily as needed for muscle spasms.     Marland Kitchen ezetimibe (ZETIA) 10 MG tablet     . ferrous sulfate 325 (65 FE) MG tablet Take 325 mg by mouth daily.     . fexofenadine (ALLEGRA) 180 MG tablet Take 180 mg by mouth daily.    Marland Kitchen gabapentin (NEURONTIN) 600 MG tablet Take 600 mg by mouth 3 (three) times daily.    Marland Kitchen  HYDROcodone-acetaminophen (NORCO) 10-325 MG per tablet Take 1 tablet by mouth 3 (three) times daily.    Marland Kitchen omeprazole (PRILOSEC) 20 MG capsule Take 1 capsule (20 mg total) by mouth daily. 30 capsule 6  . Potassium Gluconate ([employer]S POTASSIUM GLUCONATE) 2 MEQ TABS Take by mouth.    . promethazine (PHENERGAN) 25 MG tablet Take 25 mg by mouth every 6 (six) hours as needed for nausea or vomiting.    . ramipril (ALTACE) 10 MG capsule Take 1 capsule (10 mg total) by mouth daily. 30 capsule 11  . rOPINIRole (REQUIP) 0.25 MG tablet Take 0.25 mg by mouth at bedtime.    . rosuvastatin (CRESTOR) 20 MG tablet Take 20 mg by mouth at bedtime.     . sildenafil (VIAGRA) 100 MG tablet Take 100 mg by mouth daily as needed for erectile dysfunction. Reported on 03/06/2015    . tiZANidine (ZANAFLEX) 2 MG tablet Take 2 mg by mouth at bedtime.     . vitamin B-12 1000 MCG tablet Take 1 tablet (1,000 mcg total) by mouth daily. 30 tablet 1   No current facility-administered medications for this visit.    Allergies: Review of patient's allergies indicates no known allergies.    Social History: The patient  reports that he quit smoking about 9 years ago. His smoking use included Cigarettes. He has a 45 pack-year smoking history. He has never used smokeless tobacco. He reports that he does not drink alcohol or use illicit drugs.   Family History: The patient's family history includes Hypertension in his mother. There is no history of Kidney cancer, Kidney disease, or Prostate cancer.    ROS: Please see the history of present illness. Otherwise, review of systems are positive for none. All other systems are reviewed and negative.    PHYSICAL EXAM: VS: BP 110/80 mmHg  Pulse 72  Ht 6' (1.829 m)  Wt 147 lb (66.679 kg)  BMI 19.93 kg/m2 , BMI Body mass index is 19.93 kg/(m^2). GEN: Well nourished, well developed, in no acute distress  HEENT: normal  Neck: no  JVD, carotid bruits, or masses Cardiac: RRR; no murmurs, rubs, or gallops,no edema  Respiratory: clear to auscultation bilaterally, normal work of breathing GI: soft, nontender, nondistended, + BS MS: no deformity or atrophy  Skin: warm and dry, no rash Neuro: Strength and sensation are intact Psych: euthymic mood, full affect   EKG: EKG is ordered today. The ekg ordered today demonstrates normal sinus rhythm with no significant ST or T wave changes.   Recent Labs: 10/27/2014: ALT 11* 10/30/2014: BUN 7; Creatinine, Ser 0.87; Hemoglobin 12.3*; Platelets 256; Potassium 4.6; Sodium 142    Lipid Panel  Labs (Brief)    No results found for: CHOL, TRIG, HDL, CHOLHDL, VLDL, LDLCALC, LDLDIRECT  Wt Readings from Last 3 Encounters:  04/28/15 147 lb (66.679 kg)  04/23/15 145 lb (65.772 kg)  03/06/15 164 lb 1.6 oz (74.435 kg)         ASSESSMENT AND PLAN:  1. Coronary artery disease involving native coronary arteries without angina the patient is overall doing very well with no anginal symptoms. His EKG is normal. Recommend continuing medical therapy. If the patient decides to go penile implant, I do not recommend any ischemic cardiac evaluation. He would be considered at an overall low risk. Aspirin can be held 5-7 days before surgery if needed and should be resumed after.  2. Essential hypertension: Blood pressure is well controlled on current medications.  3. Hyperlipidemia: Continue treatment with Zetia and rosuvastatin. This is being followed by Dr. Elijio Miles     Disposition: FU with me in 1 year  Signed,  Kathlyn Sacramento, MD  04/28/2015 5:40 PM  [place]

## 2015-05-09 LAB — URINE CULTURE: Culture: NO GROWTH

## 2015-05-20 ENCOUNTER — Observation Stay
Admission: RE | Admit: 2015-05-20 | Discharge: 2015-05-21 | Disposition: A | Discharge status: Home / Self Care | Payer: BLUE CROSS/BLUE SHIELD | Source: Ambulatory Visit | Attending: Urology | Admitting: Urology

## 2015-05-20 ENCOUNTER — Inpatient Hospital Stay: Payer: BLUE CROSS/BLUE SHIELD | Admitting: Certified Registered Nurse Anesthetist

## 2015-05-20 ENCOUNTER — Encounter: Payer: Self-pay | Admitting: *Deleted

## 2015-05-20 ENCOUNTER — Encounter
Admission: RE | Disposition: A | Discharge status: Home / Self Care | Payer: Self-pay | Source: Ambulatory Visit | Attending: Urology

## 2015-05-20 DIAGNOSIS — I2510 Atherosclerotic heart disease of native coronary artery without angina pectoris: Secondary | ICD-10-CM | POA: Insufficient documentation

## 2015-05-20 DIAGNOSIS — K219 Gastro-esophageal reflux disease without esophagitis: Secondary | ICD-10-CM | POA: Insufficient documentation

## 2015-05-20 DIAGNOSIS — G2581 Restless legs syndrome: Secondary | ICD-10-CM | POA: Diagnosis not present

## 2015-05-20 DIAGNOSIS — Z79899 Other long term (current) drug therapy: Secondary | ICD-10-CM | POA: Insufficient documentation

## 2015-05-20 DIAGNOSIS — Z955 Presence of coronary angioplasty implant and graft: Secondary | ICD-10-CM | POA: Insufficient documentation

## 2015-05-20 DIAGNOSIS — J45909 Unspecified asthma, uncomplicated: Secondary | ICD-10-CM | POA: Insufficient documentation

## 2015-05-20 DIAGNOSIS — Z96649 Presence of unspecified artificial hip joint: Secondary | ICD-10-CM | POA: Diagnosis not present

## 2015-05-20 DIAGNOSIS — Z86711 Personal history of pulmonary embolism: Secondary | ICD-10-CM | POA: Insufficient documentation

## 2015-05-20 DIAGNOSIS — F329 Major depressive disorder, single episode, unspecified: Secondary | ICD-10-CM | POA: Insufficient documentation

## 2015-05-20 DIAGNOSIS — F419 Anxiety disorder, unspecified: Secondary | ICD-10-CM | POA: Insufficient documentation

## 2015-05-20 DIAGNOSIS — N529 Male erectile dysfunction, unspecified: Secondary | ICD-10-CM | POA: Diagnosis not present

## 2015-05-20 DIAGNOSIS — N486 Induration penis plastica: Principal | ICD-10-CM | POA: Insufficient documentation

## 2015-05-20 DIAGNOSIS — Z7982 Long term (current) use of aspirin: Secondary | ICD-10-CM | POA: Diagnosis not present

## 2015-05-20 DIAGNOSIS — E785 Hyperlipidemia, unspecified: Secondary | ICD-10-CM | POA: Insufficient documentation

## 2015-05-20 DIAGNOSIS — M1990 Unspecified osteoarthritis, unspecified site: Secondary | ICD-10-CM | POA: Insufficient documentation

## 2015-05-20 DIAGNOSIS — D689 Coagulation defect, unspecified: Secondary | ICD-10-CM | POA: Diagnosis not present

## 2015-05-20 DIAGNOSIS — Z8249 Family history of ischemic heart disease and other diseases of the circulatory system: Secondary | ICD-10-CM | POA: Insufficient documentation

## 2015-05-20 DIAGNOSIS — N5201 Erectile dysfunction due to arterial insufficiency: Secondary | ICD-10-CM | POA: Diagnosis not present

## 2015-05-20 HISTORY — PX: PENILE PROSTHESIS IMPLANT: SHX240

## 2015-05-20 SURGERY — INSERTION, PENILE PROSTHESIS, INFLATABLE
Anesthesia: General | Site: Penis | Wound class: Clean Contaminated

## 2015-05-20 MED ORDER — HEPARIN SODIUM (PORCINE) 5000 UNIT/ML IJ SOLN
5000.0000 [IU] | Freq: Three times a day (TID) | INTRAMUSCULAR | Status: DC
  Administered 2015-05-20 – 2015-05-21 (×4): 5000 [IU] via SUBCUTANEOUS
  Filled 2015-05-20 (×4): qty 1

## 2015-05-20 MED ORDER — ROSUVASTATIN CALCIUM 10 MG PO TABS
20.0000 mg | ORAL_TABLET | Freq: Every day | ORAL | Status: DC
  Administered 2015-05-20: 20 mg via ORAL
  Filled 2015-05-20: qty 2

## 2015-05-20 MED ORDER — FENTANYL CITRATE (PF) 100 MCG/2ML IJ SOLN
INTRAMUSCULAR | Status: AC
  Administered 2015-05-20: 13:00:00
  Filled 2015-05-20: qty 2

## 2015-05-20 MED ORDER — SUCCINYLCHOLINE CHLORIDE 20 MG/ML IJ SOLN
INTRAMUSCULAR | Status: DC | PRN
  Administered 2015-05-20: 100 mg via INTRAVENOUS

## 2015-05-20 MED ORDER — CEFAZOLIN SODIUM-DEXTROSE 2-4 GM/100ML-% IV SOLN
2.0000 g | Freq: Three times a day (TID) | INTRAVENOUS | Status: AC
  Administered 2015-05-20 – 2015-05-21 (×3): 2 g via INTRAVENOUS
  Filled 2015-05-20 (×6): qty 100

## 2015-05-20 MED ORDER — GABAPENTIN 600 MG PO TABS
600.0000 mg | ORAL_TABLET | Freq: Three times a day (TID) | ORAL | Status: DC
  Administered 2015-05-20 – 2015-05-21 (×3): 600 mg via ORAL
  Filled 2015-05-20 (×3): qty 1

## 2015-05-20 MED ORDER — HYDROCODONE-ACETAMINOPHEN 10-325 MG PO TABS
1.0000 | ORAL_TABLET | Freq: Three times a day (TID) | ORAL | Status: DC
  Administered 2015-05-20 – 2015-05-21 (×3): 1 via ORAL
  Filled 2015-05-20 (×3): qty 1

## 2015-05-20 MED ORDER — CEPHALEXIN 500 MG PO CAPS: 500.0000 mg | ORAL_CAPSULE | Freq: Four times a day (QID) | ORAL | Status: DC

## 2015-05-20 MED ORDER — HYDROCODONE-ACETAMINOPHEN 10-325 MG PO TABS: 1.0000 | ORAL_TABLET | Freq: Three times a day (TID) | ORAL | Status: DC

## 2015-05-20 MED ORDER — EZETIMIBE 10 MG PO TABS
10.0000 mg | ORAL_TABLET | Freq: Every day | ORAL | Status: DC
  Administered 2015-05-20 – 2015-05-21 (×2): 10 mg via ORAL
  Filled 2015-05-20 (×2): qty 1

## 2015-05-20 MED ORDER — FENTANYL CITRATE (PF) 100 MCG/2ML IJ SOLN
INTRAMUSCULAR | Status: AC
  Administered 2015-05-20: 25 ug via INTRAVENOUS
  Filled 2015-05-20: qty 2

## 2015-05-20 MED ORDER — SUGAMMADEX SODIUM 200 MG/2ML IV SOLN
INTRAVENOUS | Status: DC | PRN
  Administered 2015-05-20: 131.6 mg via INTRAVENOUS

## 2015-05-20 MED ORDER — PROPOFOL 10 MG/ML IV BOLUS
INTRAVENOUS | Status: DC | PRN
  Administered 2015-05-20: 150 mg via INTRAVENOUS

## 2015-05-20 MED ORDER — NEOMYCIN-POLYMYXIN B GU 40-200000 IR SOLN
Status: DC | PRN
  Administered 2015-05-20: 4 mL

## 2015-05-20 MED ORDER — AMLODIPINE BESYLATE 10 MG PO TABS
10.0000 mg | ORAL_TABLET | Freq: Every day | ORAL | Status: DC
  Administered 2015-05-20: 10 mg via ORAL
  Filled 2015-05-20: qty 1

## 2015-05-20 MED ORDER — GENTAMICIN IN SALINE 1.6-0.9 MG/ML-% IV SOLN
80.0000 mg | Freq: Once | INTRAVENOUS | Status: AC
  Administered 2015-05-20: 80 mg via INTRAVENOUS
  Filled 2015-05-20: qty 50

## 2015-05-20 MED ORDER — ROCURONIUM BROMIDE 100 MG/10ML IV SOLN
INTRAVENOUS | Status: DC | PRN
  Administered 2015-05-20 (×2): 10 mg via INTRAVENOUS
  Administered 2015-05-20: 30 mg via INTRAVENOUS

## 2015-05-20 MED ORDER — MORPHINE SULFATE (PF) 2 MG/ML IV SOLN
2.0000 mg | INTRAVENOUS | Status: DC | PRN
Start: 2015-05-20 — End: 2015-05-21
  Administered 2015-05-20: 4 mg via INTRAVENOUS
  Administered 2015-05-20: 2 mg via INTRAVENOUS
  Administered 2015-05-21 (×2): 4 mg via INTRAVENOUS
  Filled 2015-05-20 (×4): qty 2
  Filled 2015-05-20: qty 1

## 2015-05-20 MED ORDER — TIZANIDINE HCL 4 MG PO TABS
2.0000 mg | ORAL_TABLET | Freq: Every day | ORAL | Status: DC
  Administered 2015-05-20: 2 mg via ORAL
  Filled 2015-05-20: qty 1

## 2015-05-20 MED ORDER — LIDOCAINE HCL (CARDIAC) 20 MG/ML IV SOLN
INTRAVENOUS | Status: DC | PRN
  Administered 2015-05-20: 80 mg via INTRAVENOUS

## 2015-05-20 MED ORDER — LACTATED RINGERS IV SOLN
INTRAVENOUS | Status: DC
  Administered 2015-05-20: 09:00:00 via INTRAVENOUS

## 2015-05-20 MED ORDER — MOMETASONE FURO-FORMOTEROL FUM 100-5 MCG/ACT IN AERO
2.0000 | INHALATION_SPRAY | Freq: Two times a day (BID) | RESPIRATORY_TRACT | Status: DC
  Administered 2015-05-20 – 2015-05-21 (×2): 2 via RESPIRATORY_TRACT
  Filled 2015-05-20: qty 8.8

## 2015-05-20 MED ORDER — ACETAMINOPHEN 10 MG/ML IV SOLN
INTRAVENOUS | Status: AC
  Filled 2015-05-20: qty 100

## 2015-05-20 MED ORDER — PANTOPRAZOLE SODIUM 40 MG PO TBEC
40.0000 mg | DELAYED_RELEASE_TABLET | Freq: Every day | ORAL | Status: DC
Start: 2015-05-20 — End: 2015-05-21
  Administered 2015-05-20 – 2015-05-21 (×2): 40 mg via ORAL
  Filled 2015-05-20 (×2): qty 1

## 2015-05-20 MED ORDER — CLONAZEPAM 0.5 MG PO TABS
0.5000 mg | ORAL_TABLET | Freq: Every day | ORAL | Status: DC
  Administered 2015-05-20: 0.5 mg via ORAL
  Filled 2015-05-20: qty 1

## 2015-05-20 MED ORDER — EPHEDRINE SULFATE 50 MG/ML IJ SOLN
INTRAMUSCULAR | Status: DC | PRN
  Administered 2015-05-20: 5 mg via INTRAVENOUS

## 2015-05-20 MED ORDER — MINERAL OIL LIGHT 100 % EX OIL
TOPICAL_OIL | CUTANEOUS | Status: AC
  Filled 2015-05-20: qty 25

## 2015-05-20 MED ORDER — SENNOSIDES-DOCUSATE SODIUM 8.6-50 MG PO TABS
2.0000 | ORAL_TABLET | Freq: Every day | ORAL | Status: DC
  Administered 2015-05-20: 2 via ORAL
  Filled 2015-05-20: qty 2

## 2015-05-20 MED ORDER — ROPINIROLE HCL 0.25 MG PO TABS
0.2500 mg | ORAL_TABLET | Freq: Every day | ORAL | Status: DC
  Administered 2015-05-20: 0.25 mg via ORAL
  Filled 2015-05-20: qty 1

## 2015-05-20 MED ORDER — ONDANSETRON HCL 4 MG/2ML IJ SOLN
4.0000 mg | INTRAMUSCULAR | Status: DC | PRN
  Administered 2015-05-21: 4 mg via INTRAVENOUS
  Filled 2015-05-20: qty 2

## 2015-05-20 MED ORDER — CYCLOBENZAPRINE HCL 10 MG PO TABS: 10.0000 mg | ORAL_TABLET | Freq: Three times a day (TID) | ORAL | Status: DC | PRN

## 2015-05-20 MED ORDER — ACETAMINOPHEN 10 MG/ML IV SOLN
INTRAVENOUS | Status: DC | PRN
Start: 2015-05-20 — End: 2015-05-20
  Administered 2015-05-20: 1000 mg via INTRAVENOUS

## 2015-05-20 MED ORDER — ONDANSETRON HCL 4 MG/2ML IJ SOLN
INTRAMUSCULAR | Status: DC | PRN
  Administered 2015-05-20: 4 mg via INTRAVENOUS

## 2015-05-20 MED ORDER — FENTANYL CITRATE (PF) 250 MCG/5ML IJ SOLN
INTRAMUSCULAR | Status: DC | PRN
  Administered 2015-05-20 (×2): 50 ug via INTRAVENOUS

## 2015-05-20 MED ORDER — BUSPIRONE HCL 5 MG PO TABS
7.5000 mg | ORAL_TABLET | Freq: Two times a day (BID) | ORAL | Status: DC
  Administered 2015-05-20 – 2015-05-21 (×3): 7.5 mg via ORAL
  Filled 2015-05-20 (×3): qty 2

## 2015-05-20 MED ORDER — RAMIPRIL 5 MG PO CAPS
10.0000 mg | ORAL_CAPSULE | Freq: Every day | ORAL | Status: DC
  Administered 2015-05-20: 10 mg via ORAL
  Filled 2015-05-20: qty 2

## 2015-05-20 MED ORDER — MIDAZOLAM HCL 5 MG/5ML IJ SOLN
INTRAMUSCULAR | Status: DC | PRN
  Administered 2015-05-20: 2 mg via INTRAVENOUS

## 2015-05-20 MED ORDER — ONDANSETRON HCL 4 MG/2ML IJ SOLN: 4.0000 mg | Freq: Once | INTRAMUSCULAR | Status: DC | PRN

## 2015-05-20 MED ORDER — SODIUM CHLORIDE 0.9 % IV SOLN
2.0000 g | Freq: Once | INTRAVENOUS | Status: AC
  Administered 2015-05-20: 2 g via INTRAVENOUS
  Filled 2015-05-20: qty 2000

## 2015-05-20 MED ORDER — SODIUM CHLORIDE 0.9 % IV SOLN
INTRAVENOUS | Status: DC
  Administered 2015-05-20 – 2015-05-21 (×2): via INTRAVENOUS

## 2015-05-20 MED ORDER — FENTANYL CITRATE (PF) 100 MCG/2ML IJ SOLN
25.0000 ug | INTRAMUSCULAR | Status: DC | PRN
  Administered 2015-05-20: 25 ug via INTRAVENOUS
  Administered 2015-05-20: 50 ug via INTRAVENOUS
  Administered 2015-05-20: 25 ug via INTRAVENOUS

## 2015-05-20 SURGICAL SUPPLY — 52 items
3.0CM RTE DURA DISTAL TIP ×3 IMPLANT
BAG URO DRAIN 2000ML W/SPOUT (MISCELLANEOUS) ×3 IMPLANT
BLADE SURG 15 STRL LF DISP TIS (BLADE) ×1 IMPLANT
BLADE SURG 15 STRL SS (BLADE) ×2
CANISTER SUCT 1200ML W/VALVE (MISCELLANEOUS) ×3 IMPLANT
CATH FOL 2WAY LX 14X5 (CATHETERS) ×3 IMPLANT
CLOSURE WOUND 1/2 X4 (GAUZE/BANDAGES/DRESSINGS)
COVER MAYO STAND STRL (DRAPES) ×3 IMPLANT
DRAPE LAPAROTOMY 77X122 PED (DRAPES) ×3 IMPLANT
DRESSING TELFA 4X3 1S ST N-ADH (GAUZE/BANDAGES/DRESSINGS) IMPLANT
DRSG TEGADERM 4X4.75 (GAUZE/BANDAGES/DRESSINGS) IMPLANT
ELECT BLADE 6 FLAT ULTRCLN (ELECTRODE) ×3 IMPLANT
ELECT REM PT RETURN 9FT ADLT (ELECTROSURGICAL) ×3
ELECTRODE REM PT RTRN 9FT ADLT (ELECTROSURGICAL) ×1 IMPLANT
GAUZE FLUFF 18X24 1PLY STRL (GAUZE/BANDAGES/DRESSINGS) ×3 IMPLANT
GAUZE SPONGE 4X4 12PLY STRL (GAUZE/BANDAGES/DRESSINGS) IMPLANT
GLOVE BIO SURGEON STRL SZ7.5 (GLOVE) ×6 IMPLANT
GOWN STRL REUS W/ TWL LRG LVL3 (GOWN DISPOSABLE) ×2 IMPLANT
GOWN STRL REUS W/TWL LRG LVL3 (GOWN DISPOSABLE) ×4
IMPLANT PENILE 700LGX (Miscellaneous) ×3 IMPLANT
KIT ACCESSORY AMS 700 PUMP (UROLOGICAL SUPPLIES) ×6 IMPLANT
KIT RM TURNOVER STRD PROC AR (KITS) ×3 IMPLANT
LABEL OR SOLS (LABEL) IMPLANT
LIQUID BAND (GAUZE/BANDAGES/DRESSINGS) ×3 IMPLANT
NEEDLE FILTER BLUNT 18X 1/2SAF (NEEDLE) ×2
NEEDLE FILTER BLUNT 18X1 1/2 (NEEDLE) ×1 IMPLANT
NS IRRIG 1000ML POUR BTL (IV SOLUTION) ×3 IMPLANT
PACK BASIN MAJOR ARMC (MISCELLANEOUS) ×3 IMPLANT
PAD ABD DERMACEA PRESS 5X9 (GAUZE/BANDAGES/DRESSINGS) IMPLANT
PREP PVP WINGED SPONGE (MISCELLANEOUS) ×3 IMPLANT
RESERVOIR FLAT IZ 100ML (Miscellaneous) ×3 IMPLANT
SKW DEEP SCROTAL RETRACTION SYSTEM ×2 IMPLANT
STRIP CLOSURE SKIN 1/2X4 (GAUZE/BANDAGES/DRESSINGS) IMPLANT
SUPPORETR ATHLETIC LG (MISCELLANEOUS) ×1 IMPLANT
SUPPORTER ATHLETIC LG (MISCELLANEOUS) ×3
SURGILUBE 2OZ TUBE FLIPTOP (MISCELLANEOUS) ×3 IMPLANT
SUT CHROMIC 3 0 PS 2 (SUTURE) ×3 IMPLANT
SUT CHROMIC 3 0 SH 27 (SUTURE) IMPLANT
SUT CHROMIC 4 0 RB 1X27 (SUTURE) IMPLANT
SUT PDS 3-0 (SUTURE) ×6
SUT PDS PLUS AB 3-0 PC-5 (SUTURE) ×3 IMPLANT
SUT PLAIN 3 0 SH 27IN (SUTURE) ×6 IMPLANT
SUT VIC AB 2-0 SH 27 (SUTURE) ×2
SUT VIC AB 2-0 SH 27XBRD (SUTURE) ×1 IMPLANT
SUT VIC AB 4-0 FS2 27 (SUTURE) ×3 IMPLANT
SUT VIC AB 4-0 SH 27 (SUTURE) ×2
SUT VIC AB 4-0 SH 27XANBCTRL (SUTURE) ×1 IMPLANT
SYR 20CC LL (SYRINGE) ×3 IMPLANT
SYR 50ML LL SCALE MARK (SYRINGE) ×9 IMPLANT
SYR BULB IRRIG 60ML STRL (SYRINGE) ×3 IMPLANT
SYRINGE 10CC LL (SYRINGE) ×9 IMPLANT
WATER STERILE IRR 1000ML POUR (IV SOLUTION) ×3 IMPLANT

## 2015-05-20 NOTE — Progress Notes (Signed)
Pharmacy Antibiotic Note  Gerald Jefferson is a 60 y.o. male admitted on 05/20/2015 with surgical prophylaxis.  Pharmacy has been consulted for antibiotic renal dose adjustment.  Assessment:  Patient's renal function is appropriate for current dosing of postop cefazolin.  This patient's current antibiotics will be continued without adjustments.  Height: 6' (182.9 cm) Weight: 145 lb (65.772 kg) IBW/kg (Calculated) : 77.6  Temp (24hrs), Avg:97.4 F (36.3 C), Min:95.8 F (35.4 C), Max:98.2 F (36.8 C)  No results for input(s): WBC, CREATININE, LATICACIDVEN, VANCOTROUGH, VANCOPEAK, VANCORANDOM, GENTTROUGH, GENTPEAK, GENTRANDOM, TOBRATROUGH, TOBRAPEAK, TOBRARND, AMIKACINPEAK, AMIKACINTROU, AMIKACIN in the last 168 hours.  Estimated Creatinine Clearance: 79.3 mL/min (by C-G formula based on Cr of 0.91).    No Known Allergies    Thank you for allowing pharmacy to be a part of this patient's care.  Napoleon Form 05/20/2015 2:34 PM

## 2015-05-20 NOTE — Interval H&P Note (Signed)
History and Physical Interval Note:  05/20/2015 9:50 AM  Gerald Jefferson  has presented today for surgery, with the diagnosis of ERECTILE DYSFUNCTION  The various methods of treatment have been discussed with the patient and family. After consideration of risks, benefits and other options for treatment, the patient has consented to  Procedure(s): [employer] (N/A) as a surgical intervention .  The patient's history has been reviewed, patient examined, no change in status, stable for surgery.  I have reviewed the patient's chart and labs.  Questions were answered to the patient's satisfaction.    RRR Unlabored resp   Nickie Retort

## 2015-05-20 NOTE — Transfer of Care (Signed)
Immediate Anesthesia Transfer of Care Note  Patient: Gerald Jefferson  Procedure(s) Performed: Procedure(s): PENILE PROTHESIS INFLATABLE (N/A)  Patient Location: PACU  Anesthesia Type:General  Level of Consciousness: awake  Airway & Oxygen Therapy: Patient connected to face mask oxygen  Post-op Assessment: Report given to RN and Post -op Vital signs reviewed and stable  Post vital signs: Reviewed and stable  Last Vitals: 1226 100% sat 83 hr 97.9 tedmp 14 resp 147/95 Filed Vitals:   05/20/15 0845  BP: 131/79  Pulse: 72  Temp: 35.4 C  Resp: 16    Complications: No apparent anesthesia complications

## 2015-05-20 NOTE — Anesthesia Preprocedure Evaluation (Signed)
Anesthesia Evaluation  Patient identified by MRN, date of birth, ID band Patient awake    Reviewed: Allergy & Precautions, H&P , NPO status , Patient's Chart, lab work & pertinent test results, reviewed documented beta blocker date and time   History of Anesthesia Complications Negative for: history of anesthetic complications  Airway Mallampati: II  TM Distance: >3 FB Neck ROM: full    Dental no notable dental hx. (+) Partial Upper, Missing   Pulmonary neg shortness of breath, asthma , neg sleep apnea, COPD (mild), neg recent URI, former smoker,    Pulmonary exam normal breath sounds clear to auscultation       Cardiovascular Exercise Tolerance: Good hypertension, (-) angina+ CAD and + Cardiac Stents (2 stents placed in 2008)  (-) Past MI and (-) CABG negative cardio ROS Normal cardiovascular exam(-) dysrhythmias (-) Valvular Problems/Murmurs Rhythm:regular Rate:Normal     Neuro/Psych neg Seizures PSYCHIATRIC DISORDERS (Depression)  Neuromuscular disease    GI/Hepatic Neg liver ROS, GERD  Medicated,  Endo/Other  negative endocrine ROS  Renal/GU CRFRenal disease  negative genitourinary   Musculoskeletal   Abdominal   Peds  Hematology negative hematology ROS (+)   Anesthesia Other Findings Past Medical History:   Pulmonary embolism ([place])                                     Anemia                                          2009           Comment:while he was on Warfarin, Aspirin and Plavix.               Resolved.    Blood in stool                                               Coronary artery disease                                      Hyperlipidemia                                               Essential hypertension                                       ED (erectile dysfunction)                                    Asthma                                                       Acid reflux  Anxiety                                                      Arthritis                                                    Clotting disorder (HCC)                                        Comment:DVT-leg and lung   Depression                                                   Back pain                                                      Comment:lower   RLS (restless legs syndrome)                                 Reproductive/Obstetrics negative OB ROS                             Anesthesia Physical Anesthesia Plan  ASA: III  Anesthesia Plan: General   Post-op Pain Management:    Induction:   Airway Management Planned:   Additional Equipment:   Intra-op Plan:   Post-operative Plan:   Informed Consent: I have reviewed the patients History and Physical, chart, labs and discussed the procedure including the risks, benefits and alternatives for the proposed anesthesia with the patient or authorized representative who has indicated his/her understanding and acceptance.   Dental Advisory Given  Plan Discussed with: Anesthesiologist, CRNA and Surgeon  Anesthesia Plan Comments:         Anesthesia Quick Evaluation

## 2015-05-20 NOTE — Anesthesia Procedure Notes (Signed)
Procedure Name: Intubation Date/Time: 05/20/2015 10:35 AM Performed by: Delaney Meigs Pre-anesthesia Checklist: Patient identified, Emergency Drugs available, Suction available, Patient being monitored and Timeout performed Patient Re-evaluated:Patient Re-evaluated prior to inductionOxygen Delivery Method: Circle system utilized Preoxygenation: Pre-oxygenation with 100% oxygen Intubation Type: IV induction Ventilation: Mask ventilation without difficulty Laryngoscope Size: Miller and 2 Grade View: Grade I Tube type: Oral Tube size: 7.5 mm Number of attempts: 1 Airway Equipment and Method: Stylet Placement Confirmation: ETT inserted through vocal cords under direct vision,  positive ETCO2 and breath sounds checked- equal and bilateral Secured at: 22 cm Tube secured with: Tape Dental Injury: Teeth and Oropharynx as per pre-operative assessment

## 2015-05-20 NOTE — H&P (View-Only) (Signed)
04/23/2015 4:11 PM   [place] 1956-01-02 [id number]  Referring provider: [name], MD [address] [place], [place] [address]  Chief Complaint  Patient presents with  . Erectile Dysfunction    HPI: The patient is a 60 year old gentleman who presents with a three-month history of a crooked erection. 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 He notes that it is not painful. However, he did not have this problem prior to 3 months ago. He also has erectile dysfunction he has tried Cialis, Viagra, and possibly Levitra which do not give him a sustainable erection for intercourse. He is interested in trying a different medication. He also has hypogonadism and is being treated for this by his primary care doctor. He does get his screening blood work on a regular basis. He had a 2 cm plaque in the left corporal body on exam.  January 2017 interval history: The patient is been using Muse 1000 g when necessary. He says he is able to obtain somewhat erection but is not able to complete intercourse. He is asking for other options. He is in active phase of peroneal disease in this time. His curvature is new. He is not having pain though at this time. He cancurvature is around 90. He is really interested in trying something to help with his erections at this time.  March 2017 interval history: The patient attempted a vacuum erection device which failed. He does not want to wait until July for stabilization of his Perrone's plaque. He is very frustrated is not able to obtain an erection. He is interested in obtaining a prosthesis at this time. He has done a significant amount of research online about this.   PMH: Past Medical History  Diagnosis Date  . Pulmonary embolism ([place])   . Anemia 2009    while he was on Warfarin, Aspirin and Plavix. Resolved.   . Blood in stool   . Coronary artery disease   . Hyperlipidemia   . Essential hypertension   . ED  (erectile dysfunction)   . Asthma   . Acid reflux   . Anxiety   . Arthritis   . Clotting disorder ([place])     DVT-leg and lung  . Depression     Surgical History: Past Surgical History  Procedure Laterality Date  . Total hip arthroplasty    . Coronary angioplasty with stent placement  04/2006    Cypher DES to LAD and OM1  . Cardiac catheterization  07/2007    Patent LAD/OM stents. Stable 40% stenosis in proximal RCA  . Nose surgery      Home Medications:    Medication List       This list is accurate as of: 04/23/15  4:11 PM.  Always use your most recent med list.               Alprostadil (Vasodilator) 500 MCG Pllt  Commonly known as:  MUSE  Place 500 mcg into the urethra as needed.     alprostadil 1000 MCG pellet  Commonly known as:  MUSE  1 each (1,000 mcg total) by Transurethral route as needed for erectile dysfunction. use no more than 3 times per week     amLODipine 10 MG tablet  Commonly known as:  NORVASC  TAKE 1 TABLET BY MOUTH EVERY DAY IN THE MORNING     ANADROL-50 50 MG Tabs  Generic drug:  Oxymetholone     aspirin EC 81 MG tablet  Take 81 mg by mouth daily.     Azelastine HCl 0.15 % Soln  1 SPRAY/NARES DAILY     budesonide-formoterol 80-4.5 MCG/ACT inhaler  Commonly known as:  SYMBICORT  Inhale 2 puffs into the lungs 2 (two) times daily.     busPIRone 7.5 MG tablet  Commonly known as:  BUSPAR  Take 7.5 mg by mouth 2 (two) times daily.     clindamycin 300 MG capsule  Commonly known as:  CLEOCIN  Take 2 capsules (600 mg total) by mouth 3 (three) times daily.     clonazePAM 0.5 MG tablet  Commonly known as:  KLONOPIN     [employer] POTASSIUM GLUCONATE 2 MEQ Tabs  Generic drug:  Potassium Gluconate  Take by mouth.     cyanocobalamin 1000 MCG tablet  Take 1 tablet (1,000 mcg total) by mouth daily.     cyclobenzaprine 10 MG tablet  Commonly known as:  FLEXERIL  Take 1 tablet by mouth 3 (three) times daily as needed for muscle spasms.      ferrous sulfate 325 (65 FE) MG tablet  Take 325 mg by mouth daily.     fexofenadine 180 MG tablet  Commonly known as:  ALLEGRA  Take 180 mg by mouth daily.     gabapentin 600 MG tablet  Commonly known as:  NEURONTIN  Take 600 mg by mouth 3 (three) times daily.     HYDROcodone-acetaminophen 10-325 MG tablet  Commonly known as:  NORCO  Take 1 tablet by mouth 3 (three) times daily.     omeprazole 20 MG capsule  Commonly known as:  PRILOSEC  Take 1 capsule (20 mg total) by mouth daily.     promethazine 25 MG tablet  Commonly known as:  PHENERGAN  Take 25 mg by mouth every 6 (six) hours as needed for nausea or vomiting.     ramipril 10 MG capsule  Commonly known as:  ALTACE  Take 1 capsule (10 mg total) by mouth daily.     rOPINIRole 0.25 MG tablet  Commonly known as:  REQUIP  Take 0.25 mg by mouth at bedtime.     rosuvastatin 20 MG tablet  Commonly known as:  CRESTOR  Take 20 mg by mouth at bedtime.     sildenafil 100 MG tablet  Commonly known as:  VIAGRA  Take 100 mg by mouth daily as needed for erectile dysfunction. Reported on 03/06/2015     STENDRA 200 MG Tabs  Generic drug:  Avanafil     tiZANidine 2 MG tablet  Commonly known as:  ZANAFLEX  Take 2 mg by mouth at bedtime.     ZETIA 10 MG tablet  Generic drug:  ezetimibe        Allergies: No Known Allergies  Family History: Family History  Problem Relation Age of Onset  . Hypertension Mother   . Kidney cancer Neg Hx   . Kidney disease Neg Hx   . Prostate cancer Neg Hx     Social History:  reports that he quit smoking about 9 years ago. His smoking use included Cigarettes. He has a 45 pack-year smoking history. He has never used smokeless tobacco. He reports that he does not drink alcohol or use illicit drugs.  ROS: UROLOGY Frequent Urination?: No Hard to postpone urination?: No Burning/pain with urination?: No Get up at night to urinate?: No Leakage of urine?: No Urine stream starts and stops?:  No Trouble starting stream?: No Do you have to strain to urinate?:  No Blood in urine?: No Urinary tract infection?: No Sexually transmitted disease?: No Injury to kidneys or bladder?: No Painful intercourse?: No Weak stream?: No Erection problems?: Yes Penile pain?: No  Gastrointestinal Nausea?: No Vomiting?: No Indigestion/heartburn?: No Diarrhea?: No Constipation?: No  Constitutional Fever: No Night sweats?: No Weight loss?: No Fatigue?: No  Skin Skin rash/lesions?: No Itching?: No  Eyes Blurred vision?: No Double vision?: No  Ears/Nose/Throat Sore throat?: No Sinus problems?: No  Hematologic/Lymphatic Swollen glands?: No Easy bruising?: No  Cardiovascular Leg swelling?: No Chest pain?: No  Respiratory Cough?: No Shortness of breath?: No  Endocrine Excessive thirst?: No  Musculoskeletal Back pain?: Yes Joint pain?: Yes  Neurological Headaches?: No Dizziness?: No  Psychologic Depression?: Yes Anxiety?: Yes  Physical Exam: BP 129/77 mmHg  Pulse 75  Ht 6' (1.829 m)  Wt 145 lb (65.772 kg)  BMI 19.66 kg/m2  Constitutional:  Alert and oriented, No acute distress. HEENT: [place] AT, moist mucus membranes.  Trachea midline, no masses. Cardiovascular: No clubbing, cyanosis, or edema. Respiratory: Normal respiratory effort, no increased work of breathing. GI: Abdomen is soft, nontender, nondistended, no abdominal masses GU: No CVA tenderness.  Skin: No rashes, bruises or suspicious lesions. Lymph: No cervical or inguinal adenopathy. Neurologic: Grossly intact, no focal deficits, moving all 4 extremities. Psychiatric: Normal mood and affect.  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 He is at this time requesting aninflatable penile prosthesis. We discussed this procedure very great detail for approximately one half hour. He understands the risks, benefits, indications procedure. He understands that this procedure will result in an never able to obtain a spontaneous erection again. He understands that there is a high risk for failure including with infection. He understands the risks for surrounding iatrogenic injuries to his urethra as well as bowels. He knows this procedure is permanent, though occasionally prosthesis does fail and needs replacement. He also understands the risk for infection. He was given handouts on the AMS 700. All questions were answered and the patient is agreeable to proceeding. We will schedule him for an AMS 700 and 40 penile prosthesis. We will request for the wrap to be present at that time.  2. Erectile dysfunction As above  3. Hypogonadism This is currently managed by his primary care doctor. We will defer management to his PCP at this time  No Follow-up on file.  Nickie Retort, MD  [facility] [address], [place], [place] [address] [phone]

## 2015-05-20 NOTE — Op Note (Signed)
Date of procedure: 05/20/2015  Preoperative diagnosis:  1. Erectile dysfunction 2. Peyronie's disease   Postoperative diagnosis:  1. Erectile dysfunction 2. Peyronie's disease   Procedure: 1. Insertion of inflatable penile prosthesis (AMS XLG with 15 cm cylinders bilaterally) 2. Penile modeling  Surgeon: Baruch Gouty, MD  Anesthesia: General  Complications: None  Intraoperative findings: The patient had severe approximately 90 curvature dorsally from his prone his disease. After insertion of penile prosthesis and molding his curvature was down to 30.  EBL: 25 cc  Specimens: None  Drains: 14 French Foley catheter  Disposition: Stable to the postanesthesia care unit  Indication for procedure: The patient is a 60 y.o. male with erectile dysfunction as failed all medical therapy as well as severe Peyronie's disease and limits his ability to have intercourse. He presents today for prosthesis insertion..  After reviewing the management options for treatment, the patient elected to proceed with the above surgical procedure(s). We have discussed the potential benefits and risks of the procedure, side effects of the proposed treatment, the likelihood of the patient achieving the goals of the procedure, and any potential problems that might occur during the procedure or recuperation. Informed consent has been obtained.  Description of procedure: The patient was met in the preoperative area. All risks, benefits, and indications of the procedure were described in great detail. The patient consented to the procedure. Preoperative antibiotics were given. The patient was taken to the operative theater. General anesthesia was induced per the anesthesia service. The patient was then placed in the supine position and was prepped vigorously for 10 minutes. He was then draped in the usual sterile fashion. A timeout was called. A 14 French Foley catheter was placed per urethra into the bladder. The  bladder was drained. Using a penile scrotal approach, an approximately 7 cm incision was made vertically along the median raphae of his penile scrotal junction. The surrounding tissues were dissected into the corpora cavernosus were exposed bilaterally. Great care was taken not to injure the urethra. 2-0 PDS stay sutures were placed placed with one medial and lateral on each corpora cavernosum bilaterally. The corpora cavernosum was then opened with electrocautery. It was drained of blood. It isn't dilated up to 12 French distally and 13 French proximally. This was done bilaterally. Both corpora measured 8 cm proximally and 7 cm distally. A 12 cm AMS XLG with 3 cm boosters risen use at this point. Attention was then turned to forming the pocket for the reservoir. This was done through the left inguinal canal and into the space of Retzius after the bladder was again drained. Was able to feel the balloon from the Foley catheter to signify that was in the correct spot in the space of Retzius. The reservoir was then placed into this space and inflated 100 cc of normal saline. The prosthesis again which was 15 cm in total length bilaterally was then placed in the usual fashion. The prosthesis was then inflated revealed approximately 90 curvature dorsally of his penis from Peyronie's disease. Each of the cylinders was quite proximal to the pump. The penis was then remodeled to break up the Peyronie's plaques. His curvature is less than 30 at this point which should be sufficient for intercourse. The cylinders and pump as well as the reservoir tubing was trimmed and connected. The system did work was able to be inflated and deflated after correction. The stay sutures that were previously placed with 2-0 PDS were then tied together to close the corporotomy.  A space was developed in the inferior scrotum for placement of the penile prosthesis pump. This was secured in place by approximating surrounding dartos tissue with  2-0 Vicryl. Remaining visible tubing was then also internalized with a 2-0 Vicryl and closure of Dr.'s fascia. Subcutaneous closure and placed a 4-0 Vicryl. The skin was then fully reapproximated with glue to prevent infection will seal in the prosthesis. After the glue dried, fluffs were then placed as well as scrotal support. The patient was woken from anesthesia and transferred in stable condition post care unit.  Plan: The patient will be admitted to the floor for overnight observation. His Foley removed the morning. The patient again was warned not to use his prosthesis for 6 weeks.  Baruch Gouty, M.D.

## 2015-05-21 DIAGNOSIS — N486 Induration penis plastica: Secondary | ICD-10-CM | POA: Diagnosis not present

## 2015-05-21 LAB — BASIC METABOLIC PANEL
ANION GAP: 4 — AB (ref 5–15)
BUN: 12 mg/dL (ref 6–20)
CALCIUM: 8.2 mg/dL — AB (ref 8.9–10.3)
CO2: 26 mmol/L (ref 22–32)
Chloride: 107 mmol/L (ref 101–111)
Creatinine, Ser: 1.01 mg/dL (ref 0.61–1.24)
GLUCOSE: 136 mg/dL — AB (ref 65–99)
Potassium: 3.9 mmol/L (ref 3.5–5.1)
SODIUM: 137 mmol/L (ref 135–145)

## 2015-05-21 LAB — CBC
HCT: 33.5 % — ABNORMAL LOW (ref 40.0–52.0)
Hemoglobin: 11.8 g/dL — ABNORMAL LOW (ref 13.0–18.0)
MCH: 31.6 pg (ref 26.0–34.0)
MCHC: 35.3 g/dL (ref 32.0–36.0)
MCV: 89.4 fL (ref 80.0–100.0)
PLATELETS: 164 10*3/uL (ref 150–440)
RBC: 3.75 MIL/uL — ABNORMAL LOW (ref 4.40–5.90)
RDW: 13.6 % (ref 11.5–14.5)
WBC: 6.1 10*3/uL (ref 3.8–10.6)

## 2015-05-21 NOTE — Anesthesia Postprocedure Evaluation (Signed)
Anesthesia Post Note  Patient: Gerald Jefferson  Procedure(s) Performed: Procedure(s) (LRB): PENILE PROTHESIS INFLATABLE (N/A)  Patient location during evaluation: PACU Anesthesia Type: General Level of consciousness: awake and alert Pain management: pain level controlled Vital Signs Assessment: post-procedure vital signs reviewed and stable Respiratory status: spontaneous breathing, nonlabored ventilation, respiratory function stable and patient connected to nasal cannula oxygen Cardiovascular status: blood pressure returned to baseline and stable Postop Assessment: no signs of nausea or vomiting Anesthetic complications: no    Last Vitals:  Filed Vitals:   05/21/15 0942 05/21/15 1116  BP: 82/52 112/63  Pulse:  73  Temp:  36.7 C  Resp:  18    Last Pain:  Filed Vitals:   05/21/15 1407  PainSc: Asleep                 Martha Clan

## 2015-05-21 NOTE — Progress Notes (Signed)
Foley Cath removed per order at 9:00 AM.  Pt tolerated well with some discomfort due to surgery. 400 ml of light yellow urine emptied from bag.  Awaiting pt to void before discharge.

## 2015-05-21 NOTE — Progress Notes (Signed)
Patient discharged home with family.  All discharge instructions reviewed and discharge paperwork given to patient.  Patient verbalized understanding.  Prosthesis packet given.  IV removed in tact. Prescriptions given to patient with all questions and concerns addressed. Patient's wife at bedside for transfer home.

## 2015-05-21 NOTE — Progress Notes (Signed)
Dr. Pilar Jarvis notified regarding patients low BP of 82/52 manually. HR is 71. MD acknowledged. No new orders at this point, MD advised to continue to just monitor patient.

## 2015-05-21 NOTE — Progress Notes (Signed)
Pts SBP in the 80's, checked in both arms by NT. RN double checked with manual BP cuff and again the SBP was noted to be in the low to mids 80's. MIVF infusing at 100 ml/hr. Pt asymptomatic,  Dr Alyson Ingles notified, no new orders at this time. Will continue to monitor and notify of any changes.

## 2015-05-21 NOTE — Progress Notes (Signed)
No events overnight. No n/v/f/c Pain contolled +flatus. No bm  Filed Vitals:   05/20/15 2035 05/21/15 0010 05/21/15 0150 05/21/15 0520  BP: 110/74 81/49 82/49  106/66  Pulse: 71 72  83  Temp: 98.1 F (36.7 C) 97.9 F (36.6 C)  98.4 F (36.9 C)  TempSrc: Oral Oral  Oral  Resp: 18 20  20   Height:      Weight:      SpO2: 100% 98%  97%   I/O last 3 completed shifts: In: 2688 [P.O.:888; I.V.:1700; IV Piggyback:100] Out: 1975 [id number]; Blood:25] Total I/O In: 400 [P.O.:200; I.V.:200] Out: 400 [Urine:400]   NAD Soft nt nd Foley in place- clear Inc c/d/i. Mild penile-scrotal edema/echymosis  CBC    Component Value Date/Time   WBC 6.1 05/21/2015 0429   WBC 9.9 10/15/2012 1008   RBC 3.75* 05/21/2015 0429   RBC 3.97* 10/28/2014 0558   RBC 5.03 10/15/2012 1008   HGB 11.8* 05/21/2015 0429   HGB 15.5 10/15/2012 1008   HCT 33.5* 05/21/2015 0429   HCT 44.7 10/15/2012 1008   PLT 164 05/21/2015 0429   PLT 268 10/15/2012 1008   MCV 89.4 05/21/2015 0429   MCV 89 10/15/2012 1008   MCH 31.6 05/21/2015 0429   MCH 30.8 10/15/2012 1008   MCHC 35.3 05/21/2015 0429   MCHC 34.6 10/15/2012 1008   RDW 13.6 05/21/2015 0429   RDW 13.2 10/15/2012 1008    BMP Latest Ref Rng 05/21/2015 05/07/2015 10/30/2014  Glucose 65 - 99 mg/dL 136(H) 92 103(H)  BUN 6 - 20 mg/dL 12 22(H) 7  Creatinine 0.61 - 1.24 mg/dL 1.01 0.91 0.87  Sodium 135 - 145 mmol/L 137 140 142  Potassium 3.5 - 5.1 mmol/L 3.9 4.4 4.6  Chloride 101 - 111 mmol/L 107 110 112(H)  CO2 22 - 32 mmol/L 26 27 25   Calcium 8.9 - 10.3 mg/dL 8.2(L) 9.4 8.3(L)   POD 1 IPP placement. Doing well -d/c foley -d/c home after voiding -f/u 2 weeks -patient warned not use IPP until seen by me at 6 week follow up

## 2015-05-25 ENCOUNTER — Telehealth: Payer: Self-pay

## 2015-05-25 ENCOUNTER — Ambulatory Visit (INDEPENDENT_AMBULATORY_CARE_PROVIDER_SITE_OTHER): Payer: BLUE CROSS/BLUE SHIELD | Admitting: Urology

## 2015-05-25 ENCOUNTER — Encounter: Payer: Self-pay | Admitting: Urology

## 2015-05-25 VITALS — BP 105/68 | HR 83 | Ht 72.0 in | Wt 140.7 lb

## 2015-05-25 DIAGNOSIS — Z9889 Other specified postprocedural states: Secondary | ICD-10-CM

## 2015-05-25 NOTE — Telephone Encounter (Signed)
Pt called stating he is having complications with penile implant that was placed last week. Pt states that device has his penis in the rigid/erect position and he is having trouble urinating. Pt states that he can only urinate in a urinal and is not able to use the restroom "like normal". Pt stated he feels as though his penis should be in the flaccid position. Pt denies any pain. Please advise.

## 2015-05-25 NOTE — Discharge Summary (Signed)
Date of admission: 05/20/2015  Date of discharge: 05/25/2015  Admission diagnosis:  Erectile dysfunction, peyronie's disease  Discharge diagnosis: Same  Secondary diagnoses:  Patient Active Problem List   Diagnosis Date Noted  . Erectile dysfunction 05/20/2015  . Myositis 10/27/2014  . Myofasciitis 10/27/2014  . Renal insufficiency 10/27/2014  . Leukocytosis 10/27/2014  . Polyneuropathy ([place]) 08/26/2014  . Hip pain 03/12/2012  . Coronary artery disease   . Hyperlipidemia   . Essential hypertension   . ED (erectile dysfunction)     History and Physical: For full details, please see admission history and physical. Briefly, Gerald Jefferson is a 60 y.o. year old patient with ED, peyronie's disease who presents for IPP>   Hospital Course: Patient tolerated the procedure well.  He was then transferred to the floor after an uneventful PACU stay.  His hospital course was uncomplicated.  On POD#1 he had met discharge criteria: was eating a regular diet, was up and ambulating independently,  pain was well controlled, was voiding without a catheter, and was ready to for discharge.   Laboratory values:  No results for input(s): WBC, HGB, HCT in the last 72 hours. No results for input(s): NA, K, CL, CO2, GLUCOSE, BUN, CREATININE, CALCIUM in the last 72 hours. No results for input(s): LABPT, INR in the last 72 hours. No results for input(s): LABURIN in the last 72 hours. Results for orders placed or performed during the hospital encounter of 05/07/15  Urine culture     Status: None   Collection Time: 05/07/15  8:55 AM  Result Value Ref Range Status   Specimen Description URINE, CLEAN CATCH  Final   Special Requests NONE  Final   Culture NO GROWTH 2 DAYS  Final   Report Status 05/09/2015 FINAL  Final    Disposition: Home  Discharge instruction: The patient was instructed to be ambulatory but told to refrain from heavy lifting, strenuous activity, or driving.   Discharge medications:     Medication List    STOP taking these medications        Alprostadil (Vasodilator) 500 MCG Pllt  Commonly known as:  MUSE     alprostadil 1000 MCG pellet  Commonly known as:  MUSE     clonazePAM 0.5 MG tablet  Commonly known as:  KLONOPIN     STENDRA 200 MG Tabs  Generic drug:  Avanafil      TAKE these medications        amLODipine 10 MG tablet  Commonly known as:  NORVASC  TAKE 1 TABLET BY MOUTH EVERY DAY IN THE MORNING     ANADROL-50 50 MG Tabs  Generic drug:  Oxymetholone  Reported on 05/25/2015     aspirin EC 81 MG tablet  Take 81 mg by mouth daily.     Azelastine HCl 0.15 % Soln  1 SPRAY/NARES DAILY     budesonide-formoterol 80-4.5 MCG/ACT inhaler  Commonly known as:  SYMBICORT  Inhale 2 puffs into the lungs 2 (two) times daily.     busPIRone 7.5 MG tablet  Commonly known as:  BUSPAR  Take 7.5 mg by mouth 2 (two) times daily.     carbamide peroxide 6.5 % otic solution  Commonly known as:  DEBROX  Place 5 drops into both ears 2 (two) times daily.     cephALEXin 500 MG capsule  Commonly known as:  KEFLEX  Take 1 capsule (500 mg total) by mouth 4 (four) times daily.     clindamycin 300 MG  capsule  Commonly known as:  CLEOCIN  Take 2 capsules (600 mg total) by mouth 3 (three) times daily.     [employer]S POTASSIUM GLUCONATE 2 MEQ Tabs  Generic drug:  Potassium Gluconate  Take by mouth.     cyanocobalamin 1000 MCG tablet  Take 1 tablet (1,000 mcg total) by mouth daily.     cyclobenzaprine 10 MG tablet  Commonly known as:  FLEXERIL  Take 1 tablet by mouth 3 (three) times daily as needed for muscle spasms. Reported on 05/25/2015     ferrous sulfate 325 (65 FE) MG tablet  Take 325 mg by mouth daily.     fexofenadine 180 MG tablet  Commonly known as:  ALLEGRA  Take 180 mg by mouth daily.     gabapentin 600 MG tablet  Commonly known as:  NEURONTIN  Take 600 mg by mouth 3 (three) times daily.     HYDROcodone-acetaminophen 10-325 MG tablet  Commonly known  as:  NORCO  Take 1 tablet by mouth 3 (three) times daily.     omeprazole 20 MG capsule  Commonly known as:  PRILOSEC  Take 1 capsule (20 mg total) by mouth daily.     promethazine 25 MG tablet  Commonly known as:  PHENERGAN  Take 25 mg by mouth every 6 (six) hours as needed for nausea or vomiting.     ramipril 10 MG capsule  Commonly known as:  ALTACE  Take 1 capsule (10 mg total) by mouth daily.     rOPINIRole 0.25 MG tablet  Commonly known as:  REQUIP  Take 0.25 mg by mouth at bedtime.     rosuvastatin 20 MG tablet  Commonly known as:  CRESTOR  Take 20 mg by mouth at bedtime.     sildenafil 100 MG tablet  Commonly known as:  VIAGRA  Take 100 mg by mouth daily as needed for erectile dysfunction. Reported on 05/25/2015     tiZANidine 2 MG tablet  Commonly known as:  ZANAFLEX  Take 2 mg by mouth at bedtime.     ZETIA 10 MG tablet  Generic drug:  ezetimibe  Reported on 05/20/2015        Followup:      Follow-up Information    Follow up with Nickie Retort, MD In 2 weeks.   Specialty:  Urology   Contact information:   [address] [place] [address] [phone]

## 2015-05-25 NOTE — Progress Notes (Signed)
05/25/2015 11:12 AM   Gerald Jefferson June 02, 1955 [id number]  Referring provider: [name], MD [address] [place], [place] [address]  Chief Complaint  Patient presents with  . Post-op Problem    Erectile pump not in right place?    HPI: Patient is a 60 year old Caucasian male who presents today stating that he feels that his penile prosthesis cylinders are engaged.  Patient underwent the placement of the penile prosthesis on 05/20/2015. He also had penile modeling during the procedure for his Peyronie's disease.  He states since surgery, he feels that his penis has been erect.  He is fearful that the prosthesis has been activated.  He denies any activation of the prosthesis since the surgery.  He has had minimal serosanguineous drainage from the incision site.  He has not had fevers, chills, nausea or vomiting.  He is having a difficult time with urination as the penis sticking straight out and he cannot aim appropriately.  Otherwise, he is voiding without difficulty.     PMH: Past Medical History  Diagnosis Date  . Pulmonary embolism ([place])   . Anemia 2009    while he was on Warfarin, Aspirin and Plavix. Resolved.   . Blood in stool   . Coronary artery disease   . Hyperlipidemia   . Essential hypertension   . ED (erectile dysfunction)   . Asthma   . Acid reflux   . Anxiety   . Arthritis   . Clotting disorder ([place])     DVT-leg and lung  . Depression   . Back pain     lower  . RLS (restless legs syndrome)     Surgical History: Past Surgical History  Procedure Laterality Date  . Total hip arthroplasty    . Cardiac catheterization  07/2007    Patent LAD/OM stents. Stable 40% stenosis in proximal RCA  . Nose surgery    . Coronary angioplasty with stent placement  04/2006    Cypher DES to LAD and OM1  . Ivc filter placement ([facility] hx)    . Penile prosthesis implant N/A 05/20/2015    Procedure: PENILE PROTHESIS INFLATABLE;  Surgeon: Nickie Retort, MD;   Location: [facility] ORS;  Service: Urology;  Laterality: N/A;    Home Medications:    Medication List       This list is accurate as of: 05/25/15 11:12 AM.  Always use your most recent med list.               amLODipine 10 MG tablet  Commonly known as:  NORVASC  TAKE 1 TABLET BY MOUTH EVERY DAY IN THE MORNING     ANADROL-50 50 MG Tabs  Generic drug:  Oxymetholone  Reported on 05/25/2015     aspirin EC 81 MG tablet  Take 81 mg by mouth daily.     Azelastine HCl 0.15 % Soln  1 SPRAY/NARES DAILY     azithromycin 250 MG tablet  Commonly known as:  ZITHROMAX  See admin instructions. Reported on 05/25/2015     budesonide-formoterol 80-4.5 MCG/ACT inhaler  Commonly known as:  SYMBICORT  Inhale 2 puffs into the lungs 2 (two) times daily.     busPIRone 7.5 MG tablet  Commonly known as:  BUSPAR  Take 7.5 mg by mouth 2 (two) times daily.     butalbital-acetaminophen-caffeine 50-325-40-30 MG capsule  Commonly known as:  FIORICET WITH CODEINE  TAKE 1 CAPSULE EVERY 4-6 HOURS AS NEEDED     carbamide peroxide  6.5 % otic solution  Commonly known as:  DEBROX  Place 5 drops into both ears 2 (two) times daily.     cephALEXin 500 MG capsule  Commonly known as:  KEFLEX  Take 1 capsule (500 mg total) by mouth 4 (four) times daily.     clindamycin 300 MG capsule  Commonly known as:  CLEOCIN  Take 2 capsules (600 mg total) by mouth 3 (three) times daily.     clonazePAM 1 MG tablet  Commonly known as:  KLONOPIN  Take 1 mg by mouth every morning.     CVS POTASSIUM GLUCONATE 2 MEQ Tabs  Generic drug:  Potassium Gluconate  Take by mouth.     cyanocobalamin 1000 MCG tablet  Take 1 tablet (1,000 mcg total) by mouth daily.     cyclobenzaprine 10 MG tablet  Commonly known as:  FLEXERIL  Take 1 tablet by mouth 3 (three) times daily as needed for muscle spasms. Reported on 05/25/2015     ferrous sulfate 325 (65 FE) MG tablet  Take 325 mg by mouth daily.     fexofenadine 180 MG tablet    Commonly known as:  ALLEGRA  Take 180 mg by mouth daily.     gabapentin 600 MG tablet  Commonly known as:  NEURONTIN  Take 600 mg by mouth 3 (three) times daily.     HYDROcodone-acetaminophen 10-325 MG tablet  Commonly known as:  NORCO  Take 1 tablet by mouth 3 (three) times daily.     MUSE 1000 MCG pellet  Generic drug:  alprostadil  Reported on 05/25/2015     omeprazole 20 MG capsule  Commonly known as:  PRILOSEC  Take 1 capsule (20 mg total) by mouth daily.     promethazine 25 MG tablet  Commonly known as:  PHENERGAN  Take 25 mg by mouth every 6 (six) hours as needed for nausea or vomiting.     ramipril 10 MG capsule  Commonly known as:  ALTACE  Take 1 capsule (10 mg total) by mouth daily.     rOPINIRole 0.25 MG tablet  Commonly known as:  REQUIP  Take 0.25 mg by mouth at bedtime.     rosuvastatin 20 MG tablet  Commonly known as:  CRESTOR  Take 20 mg by mouth at bedtime.     sildenafil 100 MG tablet  Commonly known as:  VIAGRA  Take 100 mg by mouth daily as needed for erectile dysfunction. Reported on 05/25/2015     tiZANidine 2 MG tablet  Commonly known as:  ZANAFLEX  Take 2 mg by mouth at bedtime.     ZETIA 10 MG tablet  Generic drug:  ezetimibe  Reported on 05/20/2015        Allergies: No Known Allergies  Family History: Family History  Problem Relation Age of Onset  . Hypertension Mother   . Kidney cancer Neg Hx   . Kidney disease Neg Hx   . Prostate cancer Neg Hx     Social History:  reports that he quit smoking about 9 years ago. His smoking use included Cigarettes. He has a 45 pack-year smoking history. He has never used smokeless tobacco. He reports that he does not drink alcohol or use illicit drugs.  ROS: UROLOGY Frequent Urination?: No Hard to postpone urination?: No Burning/pain with urination?: No Get up at night to urinate?: No Leakage of urine?: No Urine stream starts and stops?: No Trouble starting stream?: No Do you have to  strain to urinate?:  No Blood in urine?: No Urinary tract infection?: No Sexually transmitted disease?: No Injury to kidneys or bladder?: No Painful intercourse?: No Weak stream?: No Erection problems?: Yes Penile pain?: No  Gastrointestinal Nausea?: No Vomiting?: No Indigestion/heartburn?: No Diarrhea?: No Constipation?: No  Constitutional Fever: No Night sweats?: No Weight loss?: No Fatigue?: No  Skin Skin rash/lesions?: No Itching?: No  Eyes Blurred vision?: No Double vision?: No  Ears/Nose/Throat Sore throat?: No Sinus problems?: No  Hematologic/Lymphatic Swollen glands?: No Easy bruising?: No  Cardiovascular Leg swelling?: No Chest pain?: No  Respiratory Cough?: No Shortness of breath?: No  Endocrine Excessive thirst?: No  Musculoskeletal Back pain?: Yes Joint pain?: Yes  Neurological Headaches?: No Dizziness?: No  Psychologic Depression?: No Anxiety?: No  Physical Exam: BP 105/68 mmHg  Pulse 83  Ht 6' (1.829 m)  Wt 140 lb 11.2 oz (63.821 kg)  BMI 19.08 kg/m2  Constitutional: Well nourished. Alert and oriented, No acute distress. HEENT: [place] AT, moist mucus membranes. Trachea midline, no masses. Cardiovascular: No clubbing, cyanosis, or edema. Respiratory: Normal respiratory effort, no increased work of breathing. GI: Abdomen is soft, non tender, non distended, no abdominal masses. Liver and spleen not palpable.  No hernias appreciated.  Stool sample for occult testing is not indicated.  There is bruising within the suprapubic and groin regions bilaterally. GU: No CVA tenderness.  No bladder fullness or masses.  Patient with uncircumcised phallus. Foreskin easily retracted  Urethral meatus is patent.  No penile discharge. No penile lesions or rashes.  I can palpate the cylinders and they are not inflated.  He does have bruising and induration in the base of the penile shaft.  Scrotum is bruised and with edema.  The pump is located  scrotally.  The incision site is clean and dry.  Testicles are located scrotally bilaterally. No masses are appreciated in the testicles. Left and right epididymis are normal. Skin: No rashes, bruises or suspicious lesions. Lymph: No cervical or inguinal adenopathy. Neurologic: Grossly intact, no focal deficits, moving all 4 extremities. Psychiatric: Normal mood and affect.  Laboratory Data: Lab Results  Component Value Date   WBC 6.1 05/21/2015   HGB 11.8* 05/21/2015   HCT 33.5* 05/21/2015   MCV 89.4 05/21/2015   PLT 164 05/21/2015    Lab Results  Component Value Date   CREATININE 1.01 05/21/2015    Lab Results  Component Value Date   AST 18 10/27/2014   Lab Results  Component Value Date   ALT 11* 10/27/2014      Assessment & Plan:    1. S/P Penile prosthesis placement and penile modeling:   I reassured the patient that the cylinders were not inflated.  I brought in the [place] and had the patient palpate the cylinders when it wasn't inflated and then inflated the model and had the patient feel the cylinders when they were inflated so that he could identify the difference.   I explained that the bruising and induration were to be expected after the prosthesis placement and penile modeling. I explained to him the Peyronie's plaque had to be broken during the surgery and this can be quite traumatic.  I did advise him that if he should experience fever, chills, nausea or vomiting to contact our office or head to the emergency room immediately.  I also explained that if he should experience pain that could not be controlled with pain medication to seek treatment in the emergency room or if he should experience purulent drainage from  the incision site to head to the emergency room or contact our office as well.  He is to keep his follow-up appointment on April 27 and he is to feel free to call us if he should have further concerns or questions.  Patient stated that he felt  relief after being examined and his questions were answered.   Return for keep follow up on 04/27.  These notes generated with voice recognition software. I apologize for typographical errors.  Zara Council, [facility] [address] [place], [place] [address] [phone]

## 2015-05-29 ENCOUNTER — Ambulatory Visit: Payer: BLUE CROSS/BLUE SHIELD | Admitting: Cardiovascular Disease

## 2015-06-03 ENCOUNTER — Ambulatory Visit (INDEPENDENT_AMBULATORY_CARE_PROVIDER_SITE_OTHER): Payer: BLUE CROSS/BLUE SHIELD | Admitting: Urology

## 2015-06-03 ENCOUNTER — Ambulatory Visit: Payer: BLUE CROSS/BLUE SHIELD

## 2015-06-03 VITALS — BP 101/69 | HR 67 | Ht 72.0 in | Wt 140.7 lb

## 2015-06-03 DIAGNOSIS — N486 Induration penis plastica: Secondary | ICD-10-CM

## 2015-06-03 DIAGNOSIS — N529 Male erectile dysfunction, unspecified: Secondary | ICD-10-CM

## 2015-06-03 NOTE — Progress Notes (Signed)
06/03/2015 9:06 AM   Gerald Jefferson Gerald Jefferson January 17, 1956 [id number]  Referring provider: [name], MD [address] [place], [place] [address]  Chief Complaint  Patient presents with  . Routine Post Op    PENILE PROTHESIS     HPI: The Patient is a 60 year old gentleman who is 2 weeks status post IPP for erectile dysfunction and Peyronie's disease. He is doing well this time. He notes no signs of infection. He is having mild to moderate pain occasionally. He is on chronic Norco for lower extremity pain.  PMH: Past Medical History  Diagnosis Date  . Pulmonary embolism ([place])   . Anemia 2009    while he was on Warfarin, Aspirin and Plavix. Resolved.   . Blood in stool   . Coronary artery disease   . Hyperlipidemia   . Essential hypertension   . ED (erectile dysfunction)   . Asthma   . Acid reflux   . Anxiety   . Arthritis   . Clotting disorder ([place])     DVT-leg and lung  . Depression   . Back pain     lower  . RLS (restless legs syndrome)     Surgical History: Past Surgical History  Procedure Laterality Date  . Total hip arthroplasty    . Cardiac catheterization  07/2007    Patent LAD/OM stents. Stable 40% stenosis in proximal RCA  . Nose surgery    . Coronary angioplasty with stent placement  04/2006    Cypher DES to LAD and OM1  . Ivc filter placement ([facility] hx)    . Penile prosthesis implant N/A 05/20/2015    Procedure: PENILE PROTHESIS INFLATABLE;  Surgeon: Nickie Retort, MD;  Location: [facility] ORS;  Service: Urology;  Laterality: N/A;    Home Medications:    Medication List       This list is accurate as of: 06/03/15  9:06 AM.  Always use your most recent med list.               amLODipine 10 MG tablet  Commonly known as:  NORVASC  TAKE 1 TABLET BY MOUTH EVERY DAY IN THE MORNING     ANADROL-50 50 MG Tabs  Generic drug:  Oxymetholone  Reported on 05/25/2015     aspirin EC 81 MG tablet  Take 81 mg by mouth daily.     Azelastine HCl 0.15 % Soln   1 SPRAY/NARES DAILY     budesonide-formoterol 80-4.5 MCG/ACT inhaler  Commonly known as:  SYMBICORT  Inhale 2 puffs into the lungs 2 (two) times daily.     busPIRone 7.5 MG tablet  Commonly known as:  BUSPAR  Take 7.5 mg by mouth 2 (two) times daily.     butalbital-acetaminophen-caffeine 50-325-40-30 MG capsule  Commonly known as:  FIORICET WITH CODEINE  TAKE 1 CAPSULE EVERY 4-6 HOURS AS NEEDED     carbamide peroxide 6.5 % otic solution  Commonly known as:  DEBROX  Place 5 drops into both ears 2 (two) times daily.     clonazePAM 1 MG tablet  Commonly known as:  KLONOPIN  Take 1 mg by mouth every morning.     CVS POTASSIUM GLUCONATE 2 MEQ Tabs  Generic drug:  Potassium Gluconate  Take by mouth.     cyanocobalamin 1000 MCG tablet  Take 1 tablet (1,000 mcg total) by mouth daily.     cyclobenzaprine 10 MG tablet  Commonly known as:  FLEXERIL  Take 1 tablet by mouth 3 (three)  times daily as needed for muscle spasms. Reported on 05/25/2015     ferrous sulfate 325 (65 FE) MG tablet  Take 325 mg by mouth daily.     fexofenadine 180 MG tablet  Commonly known as:  ALLEGRA  Take 180 mg by mouth daily.     gabapentin 600 MG tablet  Commonly known as:  NEURONTIN  Take 600 mg by mouth 3 (three) times daily.     HYDROcodone-acetaminophen 10-325 MG tablet  Commonly known as:  NORCO  Take 1 tablet by mouth 3 (three) times daily.     omeprazole 20 MG capsule  Commonly known as:  PRILOSEC  Take 1 capsule (20 mg total) by mouth daily.     promethazine 25 MG tablet  Commonly known as:  PHENERGAN  Take 25 mg by mouth every 6 (six) hours as needed for nausea or vomiting.     ramipril 10 MG capsule  Commonly known as:  ALTACE  Take 1 capsule (10 mg total) by mouth daily.     rOPINIRole 0.25 MG tablet  Commonly known as:  REQUIP  Take 0.25 mg by mouth at bedtime.     rosuvastatin 20 MG tablet  Commonly known as:  CRESTOR  Take 20 mg by mouth at bedtime.     tiZANidine 2 MG  tablet  Commonly known as:  ZANAFLEX  Take 2 mg by mouth at bedtime.     ZETIA 10 MG tablet  Generic drug:  ezetimibe  Reported on 05/20/2015        Allergies: No Known Allergies  Family History: Family History  Problem Relation Age of Onset  . Hypertension Mother   . Kidney cancer Neg Hx   . Kidney disease Neg Hx   . Prostate cancer Neg Hx     Social History:  reports that he quit smoking about 9 years ago. His smoking use included Cigarettes. He has a 45 pack-year smoking history. He has never used smokeless tobacco. He reports that he does not drink alcohol or use illicit drugs.  ROS: UROLOGY Frequent Urination?: No Hard to postpone urination?: No Burning/pain with urination?: No Get up at night to urinate?: No Leakage of urine?: No Urine stream starts and stops?: No Trouble starting stream?: No Do you have to strain to urinate?: No Blood in urine?: No Urinary tract infection?: No Sexually transmitted disease?: No Injury to kidneys or bladder?: No Painful intercourse?: No Weak stream?: No Erection problems?: Yes Penile pain?: Yes  Gastrointestinal Nausea?: No Vomiting?: No Indigestion/heartburn?: No Diarrhea?: No Constipation?: No  Constitutional Fever: No Night sweats?: No Weight loss?: No Fatigue?: No  Skin Skin rash/lesions?: No Itching?: No  Eyes Blurred vision?: No Double vision?: No  Ears/Nose/Throat Sore throat?: No Sinus problems?: No  Hematologic/Lymphatic Swollen glands?: No Easy bruising?: No  Cardiovascular Leg swelling?: No Chest pain?: No  Respiratory Cough?: No Shortness of breath?: No  Endocrine Excessive thirst?: No  Musculoskeletal Back pain?: Yes Joint pain?: Yes  Neurological Headaches?: No Dizziness?: No  Psychologic Depression?: No Anxiety?: No  Physical Exam: BP 101/69 mmHg  Pulse 67  Ht 6' (1.829 m)  Wt 140 lb 11.2 oz (63.821 kg)  BMI 19.08 kg/m2  Constitutional:  Alert and oriented, No  acute distress. HEENT: [place] AT, moist mucus membranes.  Trachea midline, no masses. Cardiovascular: No clubbing, cyanosis, or edema. Respiratory: Normal respiratory effort, no increased work of breathing. GI: Abdomen is soft, nontender, nondistended, no abdominal masses GU: No CVA tenderness. IPP is deflated.  Incisions clean dry and intact. There is mild bruising and swelling. He appears to be healing well. Skin: No rashes, bruises or suspicious lesions. Lymph: No cervical or inguinal adenopathy. Neurologic: Grossly intact, no focal deficits, moving all 4 extremities. Psychiatric: Normal mood and affect.  Laboratory Data: Lab Results  Component Value Date   WBC 6.1 05/21/2015   HGB 11.8* 05/21/2015   HCT 33.5* 05/21/2015   MCV 89.4 05/21/2015   PLT 164 05/21/2015    Lab Results  Component Value Date   CREATININE 1.01 05/21/2015    No results found for: PSA  No results found for: TESTOSTERONE  No results found for: HGBA1C  Urinalysis No results found for: COLORURINE, APPEARANCEUR, LABSPEC, PHURINE, GLUCOSEU, HGBUR, BILIRUBINUR, KETONESUR, PROTEINUR, UROBILINOGEN, NITRITE, LEUKOCYTESUR  Assessment & Plan:    1. Erectile dysfunction 2. Peyronie's disease The patient is healing well from his PPD placement. He will follow-up in 4 weeks [place] options on use. He is instructed not to use the device until his follow-up. He was given a prescription for Percocet today.  Return in about 4 weeks (around 07/01/2015).  Nickie Retort, MD  [facility] [address], [place], [place] [address] [phone]

## 2015-06-04 ENCOUNTER — Ambulatory Visit: Payer: BLUE CROSS/BLUE SHIELD

## 2015-06-12 ENCOUNTER — Other Ambulatory Visit: Payer: Self-pay | Admitting: *Deleted

## 2015-06-12 ENCOUNTER — Emergency Department: Payer: BLUE CROSS/BLUE SHIELD

## 2015-06-12 ENCOUNTER — Emergency Department
Admission: EM | Admit: 2015-06-12 | Discharge: 2015-06-12 | Disposition: A | Discharge status: Home / Self Care | Payer: BLUE CROSS/BLUE SHIELD | Attending: Student | Admitting: Student

## 2015-06-12 ENCOUNTER — Encounter: Payer: Self-pay | Admitting: Emergency Medicine

## 2015-06-12 DIAGNOSIS — M549 Dorsalgia, unspecified: Secondary | ICD-10-CM

## 2015-06-12 DIAGNOSIS — F329 Major depressive disorder, single episode, unspecified: Secondary | ICD-10-CM | POA: Insufficient documentation

## 2015-06-12 DIAGNOSIS — M545 Low back pain: Secondary | ICD-10-CM | POA: Diagnosis not present

## 2015-06-12 DIAGNOSIS — I10 Essential (primary) hypertension: Secondary | ICD-10-CM | POA: Diagnosis not present

## 2015-06-12 DIAGNOSIS — E785 Hyperlipidemia, unspecified: Secondary | ICD-10-CM | POA: Insufficient documentation

## 2015-06-12 DIAGNOSIS — J45909 Unspecified asthma, uncomplicated: Secondary | ICD-10-CM | POA: Insufficient documentation

## 2015-06-12 DIAGNOSIS — Z7982 Long term (current) use of aspirin: Secondary | ICD-10-CM | POA: Insufficient documentation

## 2015-06-12 DIAGNOSIS — Z87891 Personal history of nicotine dependence: Secondary | ICD-10-CM | POA: Insufficient documentation

## 2015-06-12 DIAGNOSIS — I2510 Atherosclerotic heart disease of native coronary artery without angina pectoris: Secondary | ICD-10-CM | POA: Diagnosis not present

## 2015-06-12 DIAGNOSIS — Z79899 Other long term (current) drug therapy: Secondary | ICD-10-CM | POA: Insufficient documentation

## 2015-06-12 MED ORDER — RAMIPRIL 10 MG PO CAPS: 10.0000 mg | ORAL_CAPSULE | Freq: Every day | ORAL | Status: DC

## 2015-06-12 MED ORDER — HYDROMORPHONE HCL 1 MG/ML IJ SOLN
1.0000 mg | Freq: Once | INTRAMUSCULAR | Status: AC
  Administered 2015-06-12: 1 mg via INTRAMUSCULAR
  Filled 2015-06-12: qty 1

## 2015-06-12 MED ORDER — OXYCODONE-ACETAMINOPHEN 10-325 MG PO TABS: 1.0000 | ORAL_TABLET | Freq: Four times a day (QID) | ORAL | Status: DC | PRN

## 2015-06-12 MED ORDER — METHOCARBAMOL 750 MG PO TABS: 1500.0000 mg | ORAL_TABLET | Freq: Four times a day (QID) | ORAL | Status: DC

## 2015-06-12 NOTE — ED Notes (Addendum)
Patient states he fell today because of the back pain and states he has trouble turning and twisting to either side. Pain to lower back has been off and on for 2 weeks

## 2015-06-12 NOTE — ED Provider Notes (Signed)
[facility] Emergency Department Provider Note   ____________________________________________  Time seen: Approximately 10:45 AM  I have reviewed the triage vital signs and the nursing notes.   HISTORY  Chief Complaint Back Pain and Fall    HPI Gerald Jefferson is a 60 y.o. male patient complaining of low back pain for 2 weeks. Patient states pain is causing had episodes of falling. Patient denies any bladder or bowel dysfunction but states the pain radiates to his buttocks. Patient rates the pain as a 7/10. Patient described a pain as "sharp". Patient state transient relief with current medication..   Past Medical History  Diagnosis Date  . Pulmonary embolism ([place])   . Anemia 2009    while he was on Warfarin, Aspirin and Plavix. Resolved.   . Blood in stool   . Coronary artery disease   . Hyperlipidemia   . Essential hypertension   . ED (erectile dysfunction)   . Asthma   . Acid reflux   . Anxiety   . Arthritis   . Clotting disorder ([place])     DVT-leg and lung  . Depression   . Back pain     lower  . RLS (restless legs syndrome)     Patient Active Problem List   Diagnosis Date Noted  . Erectile dysfunction 05/20/2015  . Myositis 10/27/2014  . Myofasciitis 10/27/2014  . Renal insufficiency 10/27/2014  . Leukocytosis 10/27/2014  . Polyneuropathy ([place]) 08/26/2014  . Hip pain 03/12/2012  . Coronary artery disease   . Hyperlipidemia   . Essential hypertension   . ED (erectile dysfunction)     Past Surgical History  Procedure Laterality Date  . Total hip arthroplasty    . Cardiac catheterization  07/2007    Patent LAD/OM stents. Stable 40% stenosis in proximal RCA  . Nose surgery    . Coronary angioplasty with stent placement  04/2006    Cypher DES to LAD and OM1  . Ivc filter placement ([facility] hx)    . Penile prosthesis implant N/A 05/20/2015    Procedure: PENILE PROTHESIS INFLATABLE;  Surgeon: Nickie Retort, MD;  Location: [facility]  ORS;  Service: Urology;  Laterality: N/A;    Current Outpatient Rx  Name  Route  Sig  Dispense  Refill  . amLODipine (NORVASC) 10 MG tablet      TAKE 1 TABLET BY MOUTH EVERY DAY IN THE MORNING      0   . ANADROL-50 50 MG TABS      Reported on 05/25/2015           Dispense as written.   Marland Kitchen aspirin EC 81 MG tablet   Oral   Take 81 mg by mouth daily.         . Azelastine HCl 0.15 % SOLN      1 SPRAY/NARES DAILY      0   . budesonide-formoterol (SYMBICORT) 80-4.5 MCG/ACT inhaler   Inhalation   Inhale 2 puffs into the lungs 2 (two) times daily.         . busPIRone (BUSPAR) 7.5 MG tablet   Oral   Take 7.5 mg by mouth 2 (two) times daily.      3   . butalbital-acetaminophen-caffeine (FIORICET WITH CODEINE) 50-325-40-30 MG capsule      TAKE 1 CAPSULE EVERY 4-6 HOURS AS NEEDED      1   . carbamide peroxide (DEBROX) 6.5 % otic solution   Both Ears   Place 5 drops  into both ears 2 (two) times daily.   15 mL   2   . clonazePAM (KLONOPIN) 1 MG tablet   Oral   Take 1 mg by mouth every morning.      0   . cyclobenzaprine (FLEXERIL) 10 MG tablet   Oral   Take 1 tablet by mouth 3 (three) times daily as needed for muscle spasms. Reported on 05/25/2015         . ezetimibe (ZETIA) 10 MG tablet      Reported on 05/20/2015         . ferrous sulfate 325 (65 FE) MG tablet   Oral   Take 325 mg by mouth daily.          . fexofenadine (ALLEGRA) 180 MG tablet   Oral   Take 180 mg by mouth daily.         Marland Kitchen gabapentin (NEURONTIN) 600 MG tablet   Oral   Take 600 mg by mouth 3 (three) times daily.         Marland Kitchen HYDROcodone-acetaminophen (NORCO) 10-325 MG tablet   Oral   Take 1 tablet by mouth 3 (three) times daily.   30 tablet   0   . methocarbamol (ROBAXIN-750) 750 MG tablet   Oral   Take 2 tablets (1,500 mg total) by mouth 4 (four) times daily.   40 tablet   0   . omeprazole (PRILOSEC) 20 MG capsule   Oral   Take 1 capsule (20 mg total) by mouth  daily.   30 capsule   6   . oxyCODONE-acetaminophen (PERCOCET) 10-325 MG tablet   Oral   Take 1 tablet by mouth every 6 (six) hours as needed for pain.   20 tablet   0   . Potassium Gluconate ([employer]S POTASSIUM GLUCONATE) 2 MEQ TABS   Oral   Take by mouth.         . promethazine (PHENERGAN) 25 MG tablet   Oral   Take 25 mg by mouth every 6 (six) hours as needed for nausea or vomiting.         . ramipril (ALTACE) 10 MG capsule   Oral   Take 1 capsule (10 mg total) by mouth daily.   30 capsule   11   . rOPINIRole (REQUIP) 0.25 MG tablet   Oral   Take 0.25 mg by mouth at bedtime.         . rosuvastatin (CRESTOR) 20 MG tablet   Oral   Take 20 mg by mouth at bedtime.          Marland Kitchen tiZANidine (ZANAFLEX) 2 MG tablet   Oral   Take 2 mg by mouth at bedtime.           . vitamin B-12 1000 MCG tablet   Oral   Take 1 tablet (1,000 mcg total) by mouth daily.   30 tablet   1     Allergies Review of patient's allergies indicates no known allergies.  Family History  Problem Relation Age of Onset  . Hypertension Mother   . Kidney cancer Neg Hx   . Kidney disease Neg Hx   . Prostate cancer Neg Hx     Social History Social History  Substance Use Topics  . Smoking status: Former Smoker -- 1.50 packs/day for 30 years    Types: Cigarettes    Quit date: 04/08/2006  . Smokeless tobacco: Never Used  . Alcohol Use: No    Review of Systems  Constitutional: No fever/chills Eyes: No visual changes. ENT: No sore throat. Cardiovascular: Denies chest pain. Respiratory: Denies shortness of breath. Gastrointestinal: No abdominal pain.  No nausea, no vomiting.  No diarrhea.  No constipation. Genitourinary: Negative for dysuria. Musculoskeletal: Negative for back pain. Skin: Negative for rash. Neurological: Negative for headaches, focal weakness or numbness. Psychiatric:Anxiety Endocrine:Hyperlipidemia ____________________________________________   PHYSICAL  EXAM:  VITAL SIGNS: ED Triage Vitals  Enc Vitals Group     BP 06/12/15 1018 123/77 mmHg     Pulse Rate 06/12/15 1018 80     Resp 06/12/15 1018 18     Temp 06/12/15 1018 97.8 F (36.6 C)     Temp Source 06/12/15 1018 Oral     SpO2 06/12/15 1018 99 %     Weight 06/12/15 1018 141 lb (63.957 kg)     Height 06/12/15 1018 6' (1.829 m)     Head Cir --      Peak Flow --      Pain Score 06/12/15 1019 7     Pain Loc --      Pain Edu? --      Excl. in [place]? --     Constitutional: Alert and oriented. Well appearing and in no acute distress. Eyes: Conjunctivae are normal. PERRL. EOMI. Head: Atraumatic. Nose: No congestion/rhinnorhea. Mouth/Throat: Mucous membranes are moist.  Oropharynx non-erythematous. Neck: No stridor.  No cervical spine tenderness to palpation. Hematological/Lymphatic/Immunilogical: No cervical lymphadenopathy. Cardiovascular: Normal rate, regular rhythm. Grossly normal heart sounds.  Good peripheral circulation. Respiratory: Normal respiratory effort.  No retractions. Lungs CTAB. Gastrointestinal: Soft and nontender. No distention. No abdominal bruits. No CVA tenderness. Musculoskeletal: No lower extremity tenderness nor edema.  No joint effusions. Neurologic:  Normal speech and language. No gross focal neurologic deficits are appreciated. No gait instability. Skin:  Skin is warm, dry and intact. No rash noted. Psychiatric: Mood and affect are normal. Speech and behavior are normal.  ____________________________________________   LABS (all labs ordered are listed, but only abnormal results are displayed)  Labs Reviewed - No data to display ____________________________________________  EKG   ____________________________________________  RADIOLOGY  No acute findings on x-ray of the L-spine. ____________________________________________   PROCEDURES  Procedure(s) performed: None  Critical Care performed:  No  ____________________________________________   INITIAL IMPRESSION / ASSESSMENT AND PLAN / ED COURSE  Pertinent labs & imaging results that were available during my care of the patient were reviewed by me and considered in my medical decision making (see chart for details).  Acute low back pain. Discussed x-ray finding with patient. Patient advised to follow-up with his cardiologist secondary to findings of a fractured inferior vena cava filter. Patient given a prescription for Percocet and Robaxin. ____________________________________________   FINAL CLINICAL IMPRESSION(S) / ED DIAGNOSES  Final diagnoses:  Acute back pain      NEW MEDICATIONS STARTED DURING THIS VISIT:  New Prescriptions   METHOCARBAMOL (ROBAXIN-750) 750 MG TABLET    Take 2 tablets (1,500 mg total) by mouth 4 (four) times daily.   OXYCODONE-ACETAMINOPHEN (PERCOCET) 10-325 MG TABLET    Take 1 tablet by mouth every 6 (six) hours as needed for pain.     Note:  This document was prepared using Dragon voice recognition software and may include unintentional dictation errors.    Sable Feil, PA-C 06/12/15 Day Gayle, MD 06/12/15 [phone]

## 2015-06-12 NOTE — Discharge Instructions (Signed)

## 2015-06-12 NOTE — ED Notes (Signed)
Says gets a  Catch in his lower right back for 2 weeks and has fallen due to this.  [place] today and could not get up. Pain doesn't really go down the leg, but it goes into his buttock.

## 2015-06-16 ENCOUNTER — Other Ambulatory Visit: Payer: Self-pay | Admitting: Internal Medicine

## 2015-06-16 ENCOUNTER — Other Ambulatory Visit: Payer: Self-pay | Admitting: *Deleted

## 2015-06-16 DIAGNOSIS — M545 Low back pain, unspecified: Secondary | ICD-10-CM

## 2015-06-16 MED ORDER — RAMIPRIL 10 MG PO CAPS: 10.0000 mg | ORAL_CAPSULE | Freq: Every day | ORAL | Status: DC

## 2015-06-16 NOTE — Telephone Encounter (Signed)
Requested Prescriptions   Signed Prescriptions Disp Refills  . ramipril (ALTACE) 10 MG capsule 30 capsule 3    Sig: Take 1 capsule (10 mg total) by mouth daily.    Authorizing Provider: Kathlyn Sacramento A    Ordering User: Britt Bottom

## 2015-06-18 ENCOUNTER — Other Ambulatory Visit: Payer: Self-pay | Admitting: Vascular Surgery

## 2015-06-18 DIAGNOSIS — Z95828 Presence of other vascular implants and grafts: Secondary | ICD-10-CM

## 2015-06-30 ENCOUNTER — Ambulatory Visit
Admission: RE | Admit: 2015-06-30 | Discharge: 2015-06-30 | Disposition: A | Discharge status: Home / Self Care | Payer: BLUE CROSS/BLUE SHIELD | Source: Ambulatory Visit | Attending: Vascular Surgery | Admitting: Vascular Surgery

## 2015-06-30 DIAGNOSIS — I700 Atherosclerosis of aorta: Secondary | ICD-10-CM | POA: Diagnosis not present

## 2015-06-30 DIAGNOSIS — Z95828 Presence of other vascular implants and grafts: Secondary | ICD-10-CM | POA: Insufficient documentation

## 2015-06-30 MED ORDER — IOPAMIDOL (ISOVUE-300) INJECTION 61%
100.0000 mL | Freq: Once | INTRAVENOUS | Status: AC | PRN
  Administered 2015-06-30: 100 mL via INTRAVENOUS

## 2015-07-02 ENCOUNTER — Encounter: Payer: Self-pay | Admitting: Urology

## 2015-07-02 ENCOUNTER — Ambulatory Visit (INDEPENDENT_AMBULATORY_CARE_PROVIDER_SITE_OTHER): Payer: BLUE CROSS/BLUE SHIELD | Admitting: Urology

## 2015-07-02 VITALS — BP 119/74 | HR 73 | Ht 72.0 in | Wt 142.0 lb

## 2015-07-02 DIAGNOSIS — N529 Male erectile dysfunction, unspecified: Secondary | ICD-10-CM

## 2015-07-02 DIAGNOSIS — N486 Induration penis plastica: Secondary | ICD-10-CM

## 2015-07-02 NOTE — Progress Notes (Signed)
07/02/2015 11:40 AM   Gerald Jefferson 05/18/1955 [id number]  Referring provider: [name], MD [address] [place], [place] [address]  Chief Complaint  Patient presents with  . Erectile Dysfunction    4wk recheck    HPI: The Patient is a 60 year old gentleman who is 6 weeks status post IPP for erectile dysfunction and Peyronie's disease. He is doing well this time. He notes no signs of infection.   PMH: Past Medical History  Diagnosis Date  . Pulmonary embolism ([place])   . Anemia 2009    while he was on Warfarin, Aspirin and Plavix. Resolved.   . Blood in stool   . Coronary artery disease   . Hyperlipidemia   . Essential hypertension   . ED (erectile dysfunction)   . Asthma   . Acid reflux   . Anxiety   . Arthritis   . Clotting disorder ([place])     DVT-leg and lung  . Depression   . Back pain     lower  . RLS (restless legs syndrome)     Surgical History: Past Surgical History  Procedure Laterality Date  . Total hip arthroplasty    . Cardiac catheterization  07/2007    Patent LAD/OM stents. Stable 40% stenosis in proximal RCA  . Nose surgery    . Coronary angioplasty with stent placement  04/2006    Cypher DES to LAD and OM1  . Ivc filter placement ([facility] hx)    . Penile prosthesis implant N/A 05/20/2015    Procedure: PENILE PROTHESIS INFLATABLE;  Surgeon: Nickie Retort, MD;  Location: [facility] ORS;  Service: Urology;  Laterality: N/A;    Home Medications:    Medication List       This list is accurate as of: 07/02/15 11:40 AM.  Always use your most recent med list.               amLODipine 10 MG tablet  Commonly known as:  NORVASC  TAKE 1 TABLET BY MOUTH EVERY DAY IN THE MORNING     ANADROL-50 50 MG Tabs  Generic drug:  Oxymetholone  Reported on 05/25/2015     aspirin EC 81 MG tablet  Take 81 mg by mouth daily.     Azelastine HCl 0.15 % Soln  1 SPRAY/NARES DAILY     budesonide-formoterol 80-4.5 MCG/ACT inhaler  Commonly known as:   SYMBICORT  Inhale 2 puffs into the lungs 2 (two) times daily.     busPIRone 7.5 MG tablet  Commonly known as:  BUSPAR  Take 7.5 mg by mouth 2 (two) times daily.     butalbital-acetaminophen-caffeine 50-325-40-30 MG capsule  Commonly known as:  FIORICET WITH CODEINE  TAKE 1 CAPSULE EVERY 4-6 HOURS AS NEEDED     carbamide peroxide 6.5 % otic solution  Commonly known as:  DEBROX  Place 5 drops into both ears 2 (two) times daily.     clonazePAM 1 MG tablet  Commonly known as:  KLONOPIN  Take 1 mg by mouth every morning.     CVS POTASSIUM GLUCONATE 2 MEQ Tabs  Generic drug:  Potassium Gluconate  Take by mouth.     cyanocobalamin 1000 MCG tablet  Take 1 tablet (1,000 mcg total) by mouth daily.     cyclobenzaprine 10 MG tablet  Commonly known as:  FLEXERIL  Take 1 tablet by mouth 3 (three) times daily as needed for muscle spasms. Reported on 05/25/2015     ferrous sulfate 325 (65 FE)  MG tablet  Take 325 mg by mouth daily.     fexofenadine 180 MG tablet  Commonly known as:  ALLEGRA  Take 180 mg by mouth daily.     gabapentin 600 MG tablet  Commonly known as:  NEURONTIN  Take 600 mg by mouth 3 (three) times daily.     methocarbamol 750 MG tablet  Commonly known as:  ROBAXIN-750  Take 2 tablets (1,500 mg total) by mouth 4 (four) times daily.     omeprazole 20 MG capsule  Commonly known as:  PRILOSEC  Take 1 capsule (20 mg total) by mouth daily.     promethazine 25 MG tablet  Commonly known as:  PHENERGAN  Take 25 mg by mouth every 6 (six) hours as needed for nausea or vomiting.     ramipril 10 MG capsule  Commonly known as:  ALTACE  Take 1 capsule (10 mg total) by mouth daily.     rOPINIRole 0.25 MG tablet  Commonly known as:  REQUIP  Take 0.25 mg by mouth at bedtime.     rosuvastatin 20 MG tablet  Commonly known as:  CRESTOR  Take 20 mg by mouth at bedtime.     tiZANidine 2 MG tablet  Commonly known as:  ZANAFLEX  Take 2 mg by mouth at bedtime.     ZETIA 10  MG tablet  Generic drug:  ezetimibe  Reported on 05/20/2015        Allergies: No Known Allergies  Family History: Family History  Problem Relation Age of Onset  . Hypertension Mother   . Kidney cancer Neg Hx   . Kidney disease Neg Hx   . Prostate cancer Neg Hx     Social History:  reports that he quit smoking about 9 years ago. His smoking use included Cigarettes. He has a 45 pack-year smoking history. He has never used smokeless tobacco. He reports that he does not drink alcohol or use illicit drugs.  ROS:                                        Physical Exam: BP 119/74 mmHg  Pulse 73  Ht 6' (1.829 m)  Wt 142 lb (64.411 kg)  BMI 19.25 kg/m2  Constitutional:  Alert and oriented, No acute distress. HEENT: [place] AT, moist mucus membranes.  Trachea midline, no masses. Cardiovascular: No clubbing, cyanosis, or edema. Respiratory: Normal respiratory effort, no increased work of breathing. GI: Abdomen is soft, nontender, nondistended, no abdominal masses GU: No CVA tenderness. IPP in place. His incisions until well. No sign of infection. Skin: No rashes, bruises or suspicious lesions. Lymph: No cervical or inguinal adenopathy. Neurologic: Grossly intact, no focal deficits, moving all 4 extremities. Psychiatric: Normal mood and affect.  Laboratory Data: Lab Results  Component Value Date   WBC 6.1 05/21/2015   HGB 11.8* 05/21/2015   HCT 33.5* 05/21/2015   MCV 89.4 05/21/2015   PLT 164 05/21/2015    Lab Results  Component Value Date   CREATININE 1.01 05/21/2015    No results found for: PSA  No results found for: TESTOSTERONE  No results found for: HGBA1C  Urinalysis No results found for: COLORURINE, APPEARANCEUR, LABSPEC, PHURINE, GLUCOSEU, HGBUR, BILIRUBINUR, KETONESUR, PROTEINUR, UROBILINOGEN, NITRITE, LEUKOCYTESUR   Assessment & Plan:   1. Erectile dysfunction 2. Peyronie's disease Doing well s/p IPP. The patient was instructed how to  inflate and deflate  his IPP. All questions are answered. He will go home and try to use the device. He'll call us if there were any problems. He'll otherwise follow up when necessary.  Nickie Retort, MD  [facility] [address] [place], [place] [address] [phone]

## 2015-07-03 ENCOUNTER — Ambulatory Visit
Admission: RE | Admit: 2015-07-03 | Discharge: 2015-07-03 | Disposition: A | Discharge status: Home / Self Care | Payer: BLUE CROSS/BLUE SHIELD | Source: Ambulatory Visit | Attending: Internal Medicine | Admitting: Internal Medicine

## 2015-07-03 DIAGNOSIS — M5127 Other intervertebral disc displacement, lumbosacral region: Secondary | ICD-10-CM | POA: Insufficient documentation

## 2015-07-03 DIAGNOSIS — M4806 Spinal stenosis, lumbar region: Secondary | ICD-10-CM | POA: Insufficient documentation

## 2015-07-03 DIAGNOSIS — M545 Low back pain, unspecified: Secondary | ICD-10-CM

## 2015-07-07 ENCOUNTER — Other Ambulatory Visit: Payer: Self-pay | Admitting: Vascular Surgery

## 2015-07-08 ENCOUNTER — Ambulatory Visit
Admission: RE | Admit: 2015-07-08 | Discharge: 2015-07-08 | Disposition: A | Discharge status: Home / Self Care | Payer: BLUE CROSS/BLUE SHIELD | Source: Ambulatory Visit | Attending: Vascular Surgery | Admitting: Vascular Surgery

## 2015-07-08 ENCOUNTER — Encounter
Admission: RE | Disposition: A | Discharge status: Home / Self Care | Payer: Self-pay | Source: Ambulatory Visit | Attending: Vascular Surgery

## 2015-07-08 DIAGNOSIS — I10 Essential (primary) hypertension: Secondary | ICD-10-CM | POA: Insufficient documentation

## 2015-07-08 DIAGNOSIS — T829XXA Unspecified complication of cardiac and vascular prosthetic device, implant and graft, initial encounter: Secondary | ICD-10-CM | POA: Insufficient documentation

## 2015-07-08 DIAGNOSIS — Z823 Family history of stroke: Secondary | ICD-10-CM | POA: Diagnosis not present

## 2015-07-08 DIAGNOSIS — Y832 Surgical operation with anastomosis, bypass or graft as the cause of abnormal reaction of the patient, or of later complication, without mention of misadventure at the time of the procedure: Secondary | ICD-10-CM | POA: Diagnosis not present

## 2015-07-08 DIAGNOSIS — I999 Unspecified disorder of circulatory system: Secondary | ICD-10-CM | POA: Insufficient documentation

## 2015-07-08 DIAGNOSIS — Z7982 Long term (current) use of aspirin: Secondary | ICD-10-CM | POA: Diagnosis not present

## 2015-07-08 DIAGNOSIS — Z8249 Family history of ischemic heart disease and other diseases of the circulatory system: Secondary | ICD-10-CM | POA: Insufficient documentation

## 2015-07-08 DIAGNOSIS — Z8679 Personal history of other diseases of the circulatory system: Secondary | ICD-10-CM | POA: Insufficient documentation

## 2015-07-08 DIAGNOSIS — Z954 Presence of other heart-valve replacement: Secondary | ICD-10-CM | POA: Insufficient documentation

## 2015-07-08 DIAGNOSIS — Z4589 Encounter for adjustment and management of other implanted devices: Secondary | ICD-10-CM | POA: Diagnosis not present

## 2015-07-08 DIAGNOSIS — Z87891 Personal history of nicotine dependence: Secondary | ICD-10-CM | POA: Diagnosis not present

## 2015-07-08 DIAGNOSIS — Z86718 Personal history of other venous thrombosis and embolism: Secondary | ICD-10-CM | POA: Insufficient documentation

## 2015-07-08 DIAGNOSIS — Z79899 Other long term (current) drug therapy: Secondary | ICD-10-CM | POA: Diagnosis not present

## 2015-07-08 HISTORY — PX: PERIPHERAL VASCULAR CATHETERIZATION: SHX172C

## 2015-07-08 SURGERY — IVC FILTER REMOVAL
Anesthesia: Moderate Sedation

## 2015-07-08 MED ORDER — HEPARIN SODIUM (PORCINE) 1000 UNIT/ML IJ SOLN
INTRAMUSCULAR | Status: DC | PRN
  Administered 2015-07-08: 3000 [IU] via INTRAVENOUS

## 2015-07-08 MED ORDER — LABETALOL HCL 5 MG/ML IV SOLN
10.0000 mg | INTRAVENOUS | Status: DC | PRN
Start: 2015-07-08 — End: 2015-07-08

## 2015-07-08 MED ORDER — ALUM & MAG HYDROXIDE-SIMETH 200-200-20 MG/5ML PO SUSP: 15.0000 mL | ORAL | Status: DC | PRN

## 2015-07-08 MED ORDER — LIDOCAINE-EPINEPHRINE (PF) 1 %-1:200000 IJ SOLN
INTRAMUSCULAR | Status: DC | PRN
  Administered 2015-07-08: 10 mL via INTRADERMAL

## 2015-07-08 MED ORDER — HYDRALAZINE HCL 20 MG/ML IJ SOLN
5.0000 mg | INTRAMUSCULAR | Status: DC | PRN
Start: 2015-07-08 — End: 2015-07-08

## 2015-07-08 MED ORDER — OXYCODONE-ACETAMINOPHEN 5-325 MG PO TABS: 1.0000 | ORAL_TABLET | ORAL | Status: DC | PRN

## 2015-07-08 MED ORDER — GUAIFENESIN-DM 100-10 MG/5ML PO SYRP: 15.0000 mL | ORAL_SOLUTION | ORAL | Status: DC | PRN

## 2015-07-08 MED ORDER — ONDANSETRON HCL 4 MG/2ML IJ SOLN: 4.0000 mg | Freq: Four times a day (QID) | INTRAMUSCULAR | Status: DC | PRN

## 2015-07-08 MED ORDER — PHENOL 1.4 % MT LIQD: 1.0000 | OROMUCOSAL | Status: DC | PRN

## 2015-07-08 MED ORDER — ACETAMINOPHEN 325 MG PO TABS: 325.0000 mg | ORAL_TABLET | ORAL | Status: DC | PRN

## 2015-07-08 MED ORDER — MORPHINE SULFATE (PF) 4 MG/ML IV SOLN: 2.0000 mg | INTRAVENOUS | Status: DC | PRN

## 2015-07-08 MED ORDER — MIDAZOLAM HCL 5 MG/5ML IJ SOLN
INTRAMUSCULAR | Status: AC
  Filled 2015-07-08: qty 5

## 2015-07-08 MED ORDER — ACETAMINOPHEN 325 MG RE SUPP
325.0000 mg | RECTAL | Status: DC | PRN
  Filled 2015-07-08: qty 2

## 2015-07-08 MED ORDER — HYDROMORPHONE HCL 1 MG/ML IJ SOLN: 1.0000 mg | Freq: Once | INTRAMUSCULAR | Status: DC

## 2015-07-08 MED ORDER — FENTANYL CITRATE (PF) 100 MCG/2ML IJ SOLN
INTRAMUSCULAR | Status: DC | PRN
  Administered 2015-07-08 (×2): 50 ug via INTRAVENOUS

## 2015-07-08 MED ORDER — LIDOCAINE-EPINEPHRINE (PF) 1 %-1:200000 IJ SOLN
INTRAMUSCULAR | Status: AC
  Filled 2015-07-08: qty 30

## 2015-07-08 MED ORDER — SODIUM CHLORIDE 0.9 % IV SOLN: 500.0000 mL | Freq: Once | INTRAVENOUS | Status: DC | PRN

## 2015-07-08 MED ORDER — HEPARIN SODIUM (PORCINE) 1000 UNIT/ML IJ SOLN
INTRAMUSCULAR | Status: AC
  Filled 2015-07-08: qty 1

## 2015-07-08 MED ORDER — FENTANYL CITRATE (PF) 100 MCG/2ML IJ SOLN
INTRAMUSCULAR | Status: AC
  Filled 2015-07-08: qty 2

## 2015-07-08 MED ORDER — METOPROLOL TARTRATE 5 MG/5ML IV SOLN: 2.0000 mg | INTRAVENOUS | Status: DC | PRN

## 2015-07-08 MED ORDER — HEPARIN (PORCINE) IN NACL 2-0.9 UNIT/ML-% IJ SOLN
INTRAMUSCULAR | Status: AC
  Filled 2015-07-08: qty 500

## 2015-07-08 MED ORDER — DEXTROSE 5 % IV SOLN
1.5000 g | INTRAVENOUS | Status: AC
  Administered 2015-07-08: 1.5 g via INTRAVENOUS

## 2015-07-08 MED ORDER — MIDAZOLAM HCL 2 MG/2ML IJ SOLN
INTRAMUSCULAR | Status: DC | PRN
  Administered 2015-07-08: 1 mg via INTRAVENOUS
  Administered 2015-07-08: 2 mg via INTRAVENOUS

## 2015-07-08 MED ORDER — IOPAMIDOL (ISOVUE-300) INJECTION 61%
INTRAVENOUS | Status: DC | PRN
  Administered 2015-07-08: 10 mL via INTRA_ARTERIAL

## 2015-07-08 MED ORDER — SODIUM CHLORIDE 0.9 % IV SOLN
INTRAVENOUS | Status: DC
  Administered 2015-07-08: 11:00:00 via INTRAVENOUS

## 2015-07-08 SURGICAL SUPPLY — 11 items
BALLN ULTRVRSE  6X40X75C (BALLOONS) ×2
BALLN ULTRVRSE 6X40X75C (BALLOONS) ×1
BALLOON ULTRVRSE 6X40X75C (BALLOONS) ×1 IMPLANT
DEVICE PRESTO INFLATION (MISCELLANEOUS) ×3 IMPLANT
ENSNARE 18-30 (MISCELLANEOUS) ×3 IMPLANT
PACK ANGIOGRAPHY (CUSTOM PROCEDURE TRAY) ×3 IMPLANT
SET VENACAVA FILTER RETRIEVAL (MISCELLANEOUS) ×3 IMPLANT
SHEATH 12FRX45 (SHEATH) ×3 IMPLANT
SYSTEM REMOVAL IVC FLTR FBRC (MISCELLANEOUS) ×3 IMPLANT
TOWEL OR 17X26 4PK STRL BLUE (TOWEL DISPOSABLE) ×3 IMPLANT
WIRE MAGIC TOR.035 180C (WIRE) ×3 IMPLANT

## 2015-07-08 NOTE — Discharge Instructions (Signed)
The drugs you were given will stay in your system until tomorrow, so for the next 24 hours you should not.  Drive an automobile. Make any legal decisions.  Drink any alcoholic beverages.  Today you should start with liquids and gradually work up to solid foods as your are able to tolerate them  Resume your regular medications as prescribed by your doctor.  Avoid aspirin for 24 hours.    Change the Band-Aid or dressing as needed.  After a 2 days no dressing as needed.  Avoid strenuous activity for the remainder of the day.  Please notify your primary physician immediately if you have any unusual bleeding, trouble breathing, fever >100 degrees or pain not relieved by the medication your doctor prescribed for your doctor prescribed for you physician  Return to diaslysis  tommorow.The drugs you were given will stay in your system until tomorrow, so for the next 24 hours you should not.  Drive an automobile. Make any legal decisions.  Drink any alcoholic beverages.  Today you should start with liquids and gradually work up to solid foods as your are able to tolerate them  Resume your regular medications as prescribed by your doctor.  Avoid aspirin for 24 hours.    Change the Band-Aid or dressing as needed.  After a 2 days no dressing as needed.  Avoid strenuous activity for the remainder of the day.  Please notify your primary physician immediately if you have any unusual bleeding, trouble breathing, fever >100 degrees or pain not relieved by the medication your doctor prescribed for your doctor prescribed for you physician  Return to diaslysis  tommorow.

## 2015-07-08 NOTE — Op Note (Signed)
    OPERATIVE NOTE   PROCEDURE: 1. Ultrasound guidance for vascular access to right jugular vein. 2. Catheter placement into IVC from right jugular vein. 3. Inferior venacavogram. 4. Percutaneous transluminal angioplasty of the inferior vena cava 5. Attempted retrieval of IVC filter, unsuccessful   PRE-OPERATIVE DIAGNOSIS: 1. Status post IVC filter for previous DVT    POST-OPERATIVE DIAGNOSIS: Same as above  SURGEON: DEW,JASON, MD  ASSISTANT(S): None  ANESTHESIA: local with moderate conscious sedation for approximately 45 minutes using 3 mg of Versed and 100 mcg of Fentanyl  ESTIMATED BLOOD LOSS: 25 cc  FINDING(S): 1. IVC was patent but top of the IVC filter was endothelialized  SPECIMEN(S): None  INDICATIONS:  Patient is a 60 y.o.male who presents with a previous history of IVC filter placement for DVT about 8 or 9 years ago.  The patient desires removal of the filter to avoid the small lifetime risks of perforation, infection, thrombosis, migration, and stent fracture.  Risks and benefits of removal were discussed and the patient is agreeable to proceed.  The patient understands that the filter may not be able to be retrieved if the top of the filter has embedded in the caval wall.   DESCRIPTION: After obtaining full informed written consent, the patient was brought back to the operating room and placed supine upon the operating table. Moderate conscious sedation was administered during a face to face encounter with the patient throughout the procedure with my supervision of the RN administering medicines and monitoring the patient's vital signs, pulse oximetry, telemetry and mental status throughout from the start of the procedure until the patient was taken to the recovery room. After obtaining adequate sedation, the patient was prepped and draped in the standard fashion. The right jugular vein was visualized with ultrasound and found to be widely patent. This was accessed  under direct ultrasound guidance with a Seldinger needle and a permanent image was recorded. A J-wire was then placed. After skin nick and dilatation, the retrieval sheath was placed over the wire into the inferior vena cava. Inferior venacavogram was then performed. The IVC was found to be widely patent but the hook was near the bottom of the left renal vein and was endothelialized.  I attempted to retrieve this was various snares and even the Bard cone retrieval system, but was never able to free the top of the filter from the vein wall. Over the wire, I used a 6 mm diameter by 4 cm length balloon inflated to 12 atm to balloon the inferior vena cava to the left of the filter to try to break the endothelialization and free the filter. I was still unable to snare the following angioplasty. After over 20 minutes of fluroscopy and ensuring that I did nothing to harm the patient, I felt that the filter was not going to be retrievable percutaneously.  The sheath was then removed and pressure was held on the neck. The patient tolerated the procedure well and was taken to the recovery room in stable condition.  COMPLICATIONS: None  CONDITION: Stable    DEW,JASON 07/08/2015 1:13 PM

## 2015-07-08 NOTE — H&P (Signed)
  [facility] History & Physical Update  The patient was interviewed and re-examined.  The patient's previous History and Physical has been reviewed and is unchanged.  There is no change in the plan of care. We plan to proceed with the scheduled procedure.  DEW,JASON, MD  07/08/2015, 10:54 AM

## 2015-07-09 ENCOUNTER — Encounter: Payer: Self-pay | Admitting: Vascular Surgery

## 2015-09-04 ENCOUNTER — Ambulatory Visit: Payer: BLUE CROSS/BLUE SHIELD

## 2015-10-08 ENCOUNTER — Other Ambulatory Visit: Payer: Self-pay | Admitting: Radiology

## 2015-10-08 ENCOUNTER — Telehealth: Payer: Self-pay | Admitting: Radiology

## 2015-10-08 ENCOUNTER — Encounter: Payer: Self-pay | Admitting: Urology

## 2015-10-08 ENCOUNTER — Ambulatory Visit (INDEPENDENT_AMBULATORY_CARE_PROVIDER_SITE_OTHER): Payer: BLUE CROSS/BLUE SHIELD | Admitting: Urology

## 2015-10-08 VITALS — BP 111/71 | HR 79 | Ht 72.0 in | Wt 146.9 lb

## 2015-10-08 DIAGNOSIS — N486 Induration penis plastica: Secondary | ICD-10-CM | POA: Diagnosis not present

## 2015-10-08 DIAGNOSIS — R351 Nocturia: Secondary | ICD-10-CM | POA: Diagnosis not present

## 2015-10-08 DIAGNOSIS — N529 Male erectile dysfunction, unspecified: Secondary | ICD-10-CM

## 2015-10-08 LAB — URINALYSIS, COMPLETE
BILIRUBIN UA: NEGATIVE
GLUCOSE, UA: NEGATIVE
Ketones, UA: NEGATIVE
Leukocytes, UA: NEGATIVE
Nitrite, UA: NEGATIVE
PH UA: 5 (ref 5.0–7.5)
PROTEIN UA: NEGATIVE
Specific Gravity, UA: 1.025 (ref 1.005–1.030)
UUROB: 0.2 mg/dL (ref 0.2–1.0)

## 2015-10-08 LAB — MICROSCOPIC EXAMINATION: Bacteria, UA: NONE SEEN

## 2015-10-08 LAB — BLADDER SCAN AMB NON-IMAGING: SCAN RESULT: 80

## 2015-10-08 NOTE — Progress Notes (Signed)
10/08/2015 9:40 AM   Gerald Jefferson June Leap Dec 09, 1955 [id number]  Referring provider: [name], MD [address] [place], [place] [address]  Chief Complaint  Patient presents with  . Erectile Dysfunction    penile implant     HPI: The patient is a 60 year old gentleman with a past medical history of erectile dysfunction and Peyronie's disease status post IPP placement in April 2017 presents today for follow-up. He reports difficulty in being able to inflate his prosthesis. He is only able to squeeze the bulb approximately 3 times before becomes impossible. It takes an extreme amount of force to even squeeze at least 3 times. His unable to obtain a full erection.   PMH: Past Medical History:  Diagnosis Date  . Acid reflux   . Anemia 2009   while he was on Warfarin, Aspirin and Plavix. Resolved.   . Anxiety   . Arthritis   . Asthma   . Back pain    lower  . Blood in stool   . Clotting disorder ([place])    DVT-leg and lung  . Coronary artery disease   . Depression   . ED (erectile dysfunction)   . Essential hypertension   . Hyperlipidemia   . Pulmonary embolism ([place])   . RLS (restless legs syndrome)     Surgical History: Past Surgical History:  Procedure Laterality Date  . CARDIAC CATHETERIZATION  07/2007   Patent LAD/OM stents. Stable 40% stenosis in proximal RCA  . CORONARY ANGIOPLASTY WITH STENT PLACEMENT  04/2006   Cypher DES to LAD and OM1  . IVC FILTER PLACEMENT ([facility] HX)    . NOSE SURGERY    . PENILE PROSTHESIS IMPLANT N/A 05/20/2015   Procedure: PENILE PROTHESIS INFLATABLE;  Surgeon: Nickie Retort, MD;  Location: [facility] ORS;  Service: Urology;  Laterality: N/A;  . PERIPHERAL VASCULAR CATHETERIZATION N/A 07/08/2015   Procedure: IVC Filter Removal;  Surgeon: Algernon Huxley, MD;  Location: [place] [facility];  Service: Cardiovascular;  Laterality: N/A;  . TOTAL HIP ARTHROPLASTY      Home Medications:    Medication List       Accurate as of 10/08/15   9:40 AM. Always use your most recent med list.          amLODipine 10 MG tablet Commonly known as:  NORVASC TAKE 1 TABLET BY MOUTH EVERY DAY IN THE MORNING   ANADROL-50 50 MG Tabs Generic drug:  Oxymetholone Reported on 05/25/2015   aspirin EC 81 MG tablet Take 81 mg by mouth daily.   Azelastine HCl 0.15 % Soln 1 SPRAY/NARES DAILY   budesonide-formoterol 80-4.5 MCG/ACT inhaler Commonly known as:  SYMBICORT Inhale 2 puffs into the lungs 2 (two) times daily.   busPIRone 7.5 MG tablet Commonly known as:  BUSPAR Take 7.5 mg by mouth 2 (two) times daily.   butalbital-acetaminophen-caffeine 50-325-40-30 MG capsule Commonly known as:  FIORICET WITH CODEINE TAKE 1 CAPSULE EVERY 4-6 HOURS AS NEEDED   carbamide peroxide 6.5 % otic solution Commonly known as:  DEBROX Place 5 drops into both ears 2 (two) times daily.   clonazePAM 1 MG tablet Commonly known as:  KLONOPIN Take 1 mg by mouth every morning.   [employer] POTASSIUM GLUCONATE 2 MEQ Tabs Generic drug:  Potassium Gluconate Take by mouth.   cyanocobalamin 1000 MCG tablet Take 1 tablet (1,000 mcg total) by mouth daily.   cyclobenzaprine 10 MG tablet Commonly known as:  FLEXERIL Take 1 tablet by mouth 3 (three) times daily  as needed for muscle spasms. Reported on 05/25/2015   dronabinol 2.5 MG capsule Commonly known as:  MARINOL Take 2.5 mg by mouth 2 (two) times daily before a meal.   ferrous sulfate 325 (65 FE) MG tablet Take 325 mg by mouth daily.   fexofenadine 180 MG tablet Commonly known as:  ALLEGRA Take 180 mg by mouth daily.   gabapentin 600 MG tablet Commonly known as:  NEURONTIN Take 600 mg by mouth 3 (three) times daily.   methocarbamol 750 MG tablet Commonly known as:  ROBAXIN-750 Take 2 tablets (1,500 mg total) by mouth 4 (four) times daily.   omeprazole 20 MG capsule Commonly known as:  PRILOSEC Take 1 capsule (20 mg total) by mouth daily.   promethazine 25 MG tablet Commonly known as:   PHENERGAN Take 25 mg by mouth every 6 (six) hours as needed for nausea or vomiting.   ramipril 10 MG capsule Commonly known as:  ALTACE Take 1 capsule (10 mg total) by mouth daily.   rOPINIRole 0.25 MG tablet Commonly known as:  REQUIP Take 0.25 mg by mouth at bedtime.   rosuvastatin 20 MG tablet Commonly known as:  CRESTOR Take 20 mg by mouth at bedtime.   tiZANidine 2 MG tablet Commonly known as:  ZANAFLEX Take 2 mg by mouth at bedtime.   ZETIA 10 MG tablet Generic drug:  ezetimibe Reported on 05/20/2015       Allergies: No Known Allergies  Family History: Family History  Problem Relation Age of Onset  . Hypertension Mother   . Kidney cancer Neg Hx   . Kidney disease Neg Hx   . Prostate cancer Neg Hx     Social History:  reports that he quit smoking about 9 years ago. His smoking use included Cigarettes. He has a 45.00 pack-year smoking history. He has never used smokeless tobacco. He reports that he does not drink alcohol or use drugs.  ROS: UROLOGY Frequent Urination?: No Hard to postpone urination?: No Burning/pain with urination?: No Get up at night to urinate?: Yes Leakage of urine?: No Urine stream starts and stops?: No Trouble starting stream?: No Do you have to strain to urinate?: No Blood in urine?: No Urinary tract infection?: No Sexually transmitted disease?: No Injury to kidneys or bladder?: No Painful intercourse?: No Weak stream?: No Erection problems?: No Penile pain?: No  Gastrointestinal Nausea?: No Vomiting?: No Indigestion/heartburn?: No Diarrhea?: No Constipation?: No  Constitutional Fever: No Night sweats?: No Weight loss?: No  Skin Skin rash/lesions?: No Itching?: No  Eyes Blurred vision?: No Double vision?: No  Ears/Nose/Throat Sore throat?: No Sinus problems?: No  Hematologic/Lymphatic Swollen glands?: No Easy bruising?: No  Cardiovascular Leg swelling?: No Chest pain?: No  Respiratory Cough?:  No Shortness of breath?: No  Endocrine Excessive thirst?: No  Musculoskeletal Back pain?: Yes Joint pain?: No  Neurological Headaches?: No Dizziness?: No  Psychologic Depression?: Yes Anxiety?: No  Physical Exam: BP 111/71   Pulse 79   Ht 6' (1.829 m)   Wt 146 lb 14.4 oz (66.6 kg)   BMI 19.92 kg/m   Constitutional:  Alert and oriented, No acute distress. HEENT: [place] AT, moist mucus membranes.  Trachea midline, no masses. Cardiovascular: No clubbing, cyanosis, or edema. Respiratory: Normal respiratory effort, no increased work of breathing. GI: Abdomen is soft, nontender, nondistended, no abdominal masses GU: No CVA tenderness. In testing the patient's penile prosthesis, it is impossible to fully inflate. It is difficult to squeeze the inflation bulb. It appears  the inflation mechanism has failed and the device is defective. Skin: No rashes, bruises or suspicious lesions. Lymph: No cervical or inguinal adenopathy. Neurologic: Grossly intact, no focal deficits, moving all 4 extremities. Psychiatric: Normal mood and affect.  Laboratory Data: Lab Results  Component Value Date   WBC 6.1 05/21/2015   HGB 11.8 (L) 05/21/2015   HCT 33.5 (L) 05/21/2015   MCV 89.4 05/21/2015   PLT 164 05/21/2015    Lab Results  Component Value Date   CREATININE 1.01 05/21/2015    No results found for: PSA  No results found for: TESTOSTERONE  No results found for: HGBA1C  Urinalysis No results found for: COLORURINE, APPEARANCEUR, LABSPEC, PHURINE, GLUCOSEU, HGBUR, BILIRUBINUR, KETONESUR, PROTEINUR, UROBILINOGEN, NITRITE, LEUKOCYTESUR   Assessment & Plan:    1. Erectile dysfunction 2. Peyronie's disease I discussed with the patient that his inflatable prosthesis appears to have become effective. I discussed that the only option at this time would be to replace his prosthesis. We discussed the risks, benefits, and indications of this procedure. He is familiar with the procedure as he  has had it done before. All questions were answered. We'll plan for replacement of his inflatable penile prosthesis.   Nickie Retort, MD  [facility] [address] [place], [place] [address] [phone]

## 2015-10-08 NOTE — Telephone Encounter (Signed)
Notified patient of surgery scheduled with Dr. Pilar Jarvis on 11/11/2015, pre-admit testing appointment on 11/03/2015 at 8:00, and to call day prior to surgery for arrival time to SDS. Advised pt to hold ASA 81mg  beginning 11/04/15 & resume after surgery. Pt voices understanding.

## 2015-10-11 LAB — CULTURE, URINE COMPREHENSIVE

## 2015-10-13 ENCOUNTER — Other Ambulatory Visit: Payer: BLUE CROSS/BLUE SHIELD

## 2015-10-26 NOTE — Telephone Encounter (Signed)
Pt called stating he will lose his insurance as of 11/07/15 & asks if surgery can be moved up. Notified pt surgery has been r/s to 10/29/15, pre-admit testing appt on 10/28/15 @12 :15 & to call day prior to surgery for arrival time to SDS. Advised pt to hold ASA 81mg  beginning immediately. Pt voices understanding.

## 2015-10-28 ENCOUNTER — Encounter
Admission: RE | Admit: 2015-10-28 | Discharge: 2015-10-28 | Disposition: A | Discharge status: Home / Self Care | Payer: BLUE CROSS/BLUE SHIELD | Source: Ambulatory Visit | Attending: Urology | Admitting: Urology

## 2015-10-28 DIAGNOSIS — Z7951 Long term (current) use of inhaled steroids: Secondary | ICD-10-CM | POA: Diagnosis not present

## 2015-10-28 DIAGNOSIS — T83410A Breakdown (mechanical) of penile (implanted) prosthesis, initial encounter: Secondary | ICD-10-CM | POA: Diagnosis not present

## 2015-10-28 DIAGNOSIS — E785 Hyperlipidemia, unspecified: Secondary | ICD-10-CM | POA: Diagnosis not present

## 2015-10-28 DIAGNOSIS — X58XXXA Exposure to other specified factors, initial encounter: Secondary | ICD-10-CM | POA: Diagnosis not present

## 2015-10-28 DIAGNOSIS — K219 Gastro-esophageal reflux disease without esophagitis: Secondary | ICD-10-CM | POA: Diagnosis not present

## 2015-10-28 DIAGNOSIS — J45909 Unspecified asthma, uncomplicated: Secondary | ICD-10-CM | POA: Diagnosis not present

## 2015-10-28 DIAGNOSIS — G2581 Restless legs syndrome: Secondary | ICD-10-CM | POA: Diagnosis not present

## 2015-10-28 DIAGNOSIS — I10 Essential (primary) hypertension: Secondary | ICD-10-CM | POA: Diagnosis not present

## 2015-10-28 DIAGNOSIS — Z87438 Personal history of other diseases of male genital organs: Secondary | ICD-10-CM | POA: Diagnosis not present

## 2015-10-28 DIAGNOSIS — Z86711 Personal history of pulmonary embolism: Secondary | ICD-10-CM | POA: Diagnosis not present

## 2015-10-28 DIAGNOSIS — I2510 Atherosclerotic heart disease of native coronary artery without angina pectoris: Secondary | ICD-10-CM | POA: Diagnosis not present

## 2015-10-28 DIAGNOSIS — M1990 Unspecified osteoarthritis, unspecified site: Secondary | ICD-10-CM | POA: Diagnosis not present

## 2015-10-28 DIAGNOSIS — Z955 Presence of coronary angioplasty implant and graft: Secondary | ICD-10-CM | POA: Diagnosis not present

## 2015-10-28 DIAGNOSIS — Z9889 Other specified postprocedural states: Secondary | ICD-10-CM | POA: Diagnosis not present

## 2015-10-28 DIAGNOSIS — Z86718 Personal history of other venous thrombosis and embolism: Secondary | ICD-10-CM | POA: Diagnosis not present

## 2015-10-28 DIAGNOSIS — F419 Anxiety disorder, unspecified: Secondary | ICD-10-CM | POA: Diagnosis not present

## 2015-10-28 DIAGNOSIS — Z79899 Other long term (current) drug therapy: Secondary | ICD-10-CM | POA: Diagnosis not present

## 2015-10-28 DIAGNOSIS — Z7982 Long term (current) use of aspirin: Secondary | ICD-10-CM | POA: Diagnosis not present

## 2015-10-28 DIAGNOSIS — N529 Male erectile dysfunction, unspecified: Secondary | ICD-10-CM | POA: Diagnosis present

## 2015-10-28 LAB — BASIC METABOLIC PANEL
ANION GAP: 5 (ref 5–15)
BUN: 16 mg/dL (ref 6–20)
CO2: 28 mmol/L (ref 22–32)
Calcium: 9.7 mg/dL (ref 8.9–10.3)
Chloride: 109 mmol/L (ref 101–111)
Creatinine, Ser: 1.01 mg/dL (ref 0.61–1.24)
GFR calc Af Amer: >60 mL/min (ref >60)
GLUCOSE: 102 mg/dL — AB (ref 65–99)
POTASSIUM: 4.5 mmol/L (ref 3.5–5.1)
Sodium: 142 mmol/L (ref 135–145)

## 2015-10-28 LAB — CBC
HEMATOCRIT: 41.8 % (ref 40.0–52.0)
HEMOGLOBIN: 14.4 g/dL (ref 13.0–18.0)
MCH: 30.2 pg (ref 26.0–34.0)
MCHC: 34.3 g/dL (ref 32.0–36.0)
MCV: 88.1 fL (ref 80.0–100.0)
Platelets: 216 10*3/uL (ref 150–440)
RBC: 4.75 MIL/uL (ref 4.40–5.90)
RDW: 13.6 % (ref 11.5–14.5)
WBC: 5.5 10*3/uL (ref 3.8–10.6)

## 2015-10-28 LAB — PROTIME-INR
INR: 0.95
Prothrombin Time: 12.7 seconds (ref 11.4–15.2)

## 2015-10-28 LAB — APTT: APTT: 41 s — AB (ref 24–36)

## 2015-10-28 MED ORDER — SODIUM CHLORIDE 0.9 % IV SOLN
2.0000 g | INTRAVENOUS | Status: AC
  Administered 2015-10-29: 2 g via INTRAVENOUS
  Filled 2015-10-28: qty 2000

## 2015-10-28 MED ORDER — GENTAMICIN SULFATE 40 MG/ML IJ SOLN
120.0000 mg | INTRAVENOUS | Status: AC
  Administered 2015-10-29: 120 mg via INTRAVENOUS
  Filled 2015-10-28: qty 3

## 2015-10-28 NOTE — Patient Instructions (Signed)
  Your procedure is scheduled on: 10/29/15 ARRIVAL TIME 1215 PM Report to Day Surgery. [facility]ICAL [facility] SECOND FLOOR  Remember: Instructions that are not followed completely may result in serious medical risk, up to and including death, or upon the discretion of your surgeon and anesthesiologist your surgery may need to be rescheduled.    _X___ 1. Do not eat food or drink liquids after midnight. No gum chewing or hard candies.     _X___ 2. No Alcohol for 24 hours before or after surgery.   __X__ 3. Do Not Smoke For 24 Hours Prior to Your Surgery.   ____ 4. Bring all medications with you on the day of surgery if instructed.    __X__ 5. Notify your doctor if there is any change in your medical condition     (cold, fever, infections).       Do not wear jewelry, make-up, hairpins, clips or nail polish.  Do not wear lotions, powders, or perfumes. You may wear deodorant.  Do not shave 48 hours prior to surgery. Men may shave face and neck.  Do not bring valuables to the hospital.    [facility] is not responsible for any belongings or valuables.               Contacts, dentures or bridgework may not be worn into surgery.  Leave your suitcase in the car. After surgery it may be brought to your room.  For patients admitted to the hospital, discharge time is determined by your                treatment team.   Patients discharged the day of surgery will not be allowed to drive home.    X__ Take these medicines the morning of surgery with A SIP OF WATER:    1. CLONIDINE  2. OMEPRAZOLE AT BEDTIME 10/28/15 AND IN AM  3. AMLODIPINE  4. HYDROCODONE  5. GABAPENTIN  6. ALTACE  ____ Fleet Enema (as directed)   __X__ Use CHG Soap as directed  __X__ Use inhalers on the day of surgery  ____ Stop metformin 2 days prior to surgery    ____ Take 1/2 of usual insulin dose the night before surgery and none on the morning of surgery.   X____ Stop Coumadin/Plavix/aspirin on   ASPIRIN ALREADY  STOPPED  ____ Stop Anti-inflammatories on    ____ Stop supplements until after surgery.    ____ Bring C-Pap to the hospital.

## 2015-10-29 ENCOUNTER — Ambulatory Visit
Admission: RE | Admit: 2015-10-29 | Discharge: 2015-10-29 | Disposition: A | Discharge status: Home / Self Care | Payer: BLUE CROSS/BLUE SHIELD | Source: Ambulatory Visit | Attending: Urology | Admitting: Urology

## 2015-10-29 ENCOUNTER — Encounter: Payer: Self-pay | Admitting: *Deleted

## 2015-10-29 ENCOUNTER — Ambulatory Visit: Payer: BLUE CROSS/BLUE SHIELD | Admitting: Anesthesiology

## 2015-10-29 ENCOUNTER — Encounter
Admission: RE | Disposition: A | Discharge status: Home / Self Care | Payer: Self-pay | Source: Ambulatory Visit | Attending: Urology

## 2015-10-29 ENCOUNTER — Telehealth: Payer: Self-pay

## 2015-10-29 DIAGNOSIS — Z86718 Personal history of other venous thrombosis and embolism: Secondary | ICD-10-CM | POA: Insufficient documentation

## 2015-10-29 DIAGNOSIS — K219 Gastro-esophageal reflux disease without esophagitis: Secondary | ICD-10-CM | POA: Insufficient documentation

## 2015-10-29 DIAGNOSIS — E785 Hyperlipidemia, unspecified: Secondary | ICD-10-CM | POA: Insufficient documentation

## 2015-10-29 DIAGNOSIS — T85898A Other specified complication of other internal prosthetic devices, implants and grafts, initial encounter: Secondary | ICD-10-CM | POA: Diagnosis not present

## 2015-10-29 DIAGNOSIS — I2510 Atherosclerotic heart disease of native coronary artery without angina pectoris: Secondary | ICD-10-CM | POA: Insufficient documentation

## 2015-10-29 DIAGNOSIS — G2581 Restless legs syndrome: Secondary | ICD-10-CM | POA: Insufficient documentation

## 2015-10-29 DIAGNOSIS — Z955 Presence of coronary angioplasty implant and graft: Secondary | ICD-10-CM | POA: Insufficient documentation

## 2015-10-29 DIAGNOSIS — Z87438 Personal history of other diseases of male genital organs: Secondary | ICD-10-CM | POA: Insufficient documentation

## 2015-10-29 DIAGNOSIS — Z7982 Long term (current) use of aspirin: Secondary | ICD-10-CM | POA: Insufficient documentation

## 2015-10-29 DIAGNOSIS — N529 Male erectile dysfunction, unspecified: Secondary | ICD-10-CM | POA: Insufficient documentation

## 2015-10-29 DIAGNOSIS — M1990 Unspecified osteoarthritis, unspecified site: Secondary | ICD-10-CM | POA: Insufficient documentation

## 2015-10-29 DIAGNOSIS — Z7951 Long term (current) use of inhaled steroids: Secondary | ICD-10-CM | POA: Insufficient documentation

## 2015-10-29 DIAGNOSIS — F419 Anxiety disorder, unspecified: Secondary | ICD-10-CM | POA: Insufficient documentation

## 2015-10-29 DIAGNOSIS — T83410A Breakdown (mechanical) of penile (implanted) prosthesis, initial encounter: Secondary | ICD-10-CM | POA: Diagnosis not present

## 2015-10-29 DIAGNOSIS — Z9889 Other specified postprocedural states: Secondary | ICD-10-CM | POA: Insufficient documentation

## 2015-10-29 DIAGNOSIS — I10 Essential (primary) hypertension: Secondary | ICD-10-CM | POA: Insufficient documentation

## 2015-10-29 DIAGNOSIS — X58XXXA Exposure to other specified factors, initial encounter: Secondary | ICD-10-CM | POA: Insufficient documentation

## 2015-10-29 DIAGNOSIS — J45909 Unspecified asthma, uncomplicated: Secondary | ICD-10-CM | POA: Insufficient documentation

## 2015-10-29 DIAGNOSIS — Z86711 Personal history of pulmonary embolism: Secondary | ICD-10-CM | POA: Insufficient documentation

## 2015-10-29 DIAGNOSIS — Z79899 Other long term (current) drug therapy: Secondary | ICD-10-CM | POA: Insufficient documentation

## 2015-10-29 HISTORY — PX: PENILE PROSTHESIS IMPLANT: SHX240

## 2015-10-29 SURGERY — INSERTION, PENILE PROSTHESIS, INFLATABLE
Anesthesia: General | Site: Penis | Wound class: Clean

## 2015-10-29 MED ORDER — MIDAZOLAM HCL 2 MG/2ML IJ SOLN
INTRAMUSCULAR | Status: DC | PRN
  Administered 2015-10-29 (×2): 1 mg via INTRAVENOUS

## 2015-10-29 MED ORDER — FENTANYL CITRATE (PF) 100 MCG/2ML IJ SOLN: 25.0000 ug | INTRAMUSCULAR | Status: DC | PRN

## 2015-10-29 MED ORDER — MINERAL OIL LIGHT 100 % EX OIL
TOPICAL_OIL | CUTANEOUS | Status: AC
  Filled 2015-10-29: qty 25

## 2015-10-29 MED ORDER — FENTANYL CITRATE (PF) 100 MCG/2ML IJ SOLN
INTRAMUSCULAR | Status: DC | PRN
  Administered 2015-10-29 (×2): 25 ug via INTRAVENOUS
  Administered 2015-10-29: 50 ug via INTRAVENOUS

## 2015-10-29 MED ORDER — OXYCODONE-ACETAMINOPHEN 5-325 MG PO TABS: 1.0000 | ORAL_TABLET | ORAL | 0 refills | Status: DC | PRN

## 2015-10-29 MED ORDER — ONDANSETRON HCL 4 MG/2ML IJ SOLN: 4.0000 mg | Freq: Once | INTRAMUSCULAR | Status: DC | PRN

## 2015-10-29 MED ORDER — OXYCODONE-ACETAMINOPHEN 5-325 MG PO TABS
1.0000 | ORAL_TABLET | ORAL | Status: DC | PRN
  Administered 2015-10-29: 1 via ORAL

## 2015-10-29 MED ORDER — PROPOFOL 10 MG/ML IV BOLUS
INTRAVENOUS | Status: DC | PRN
  Administered 2015-10-29: 100 mg via INTRAVENOUS
  Administered 2015-10-29: 200 mg via INTRAVENOUS

## 2015-10-29 MED ORDER — LACTATED RINGERS IV SOLN
INTRAVENOUS | Status: DC
  Administered 2015-10-29 (×3): via INTRAVENOUS

## 2015-10-29 MED ORDER — CEPHALEXIN 500 MG PO CAPS: 500.0000 mg | ORAL_CAPSULE | Freq: Three times a day (TID) | ORAL | 0 refills | Status: DC

## 2015-10-29 MED ORDER — NEOMYCIN-POLYMYXIN B GU 40-200000 IR SOLN
Status: DC | PRN
  Administered 2015-10-29: 4 mL

## 2015-10-29 MED ORDER — PHENYLEPHRINE HCL 10 MG/ML IJ SOLN
INTRAMUSCULAR | Status: DC | PRN
  Administered 2015-10-29 (×4): 100 ug via INTRAVENOUS

## 2015-10-29 MED ORDER — DEXAMETHASONE SODIUM PHOSPHATE 10 MG/ML IJ SOLN
INTRAMUSCULAR | Status: DC | PRN
  Administered 2015-10-29: 5 mg via INTRAVENOUS

## 2015-10-29 MED ORDER — OXYCODONE-ACETAMINOPHEN 5-325 MG PO TABS
ORAL_TABLET | ORAL | Status: AC
  Filled 2015-10-29: qty 1

## 2015-10-29 MED ORDER — NEOMYCIN-POLYMYXIN B GU 40-200000 IR SOLN
Status: AC
  Filled 2015-10-29: qty 4

## 2015-10-29 MED ORDER — EPHEDRINE SULFATE 50 MG/ML IJ SOLN
INTRAMUSCULAR | Status: DC | PRN
  Administered 2015-10-29: 10 mg via INTRAVENOUS

## 2015-10-29 MED ORDER — LIDOCAINE HCL (CARDIAC) 20 MG/ML IV SOLN
INTRAVENOUS | Status: DC | PRN
  Administered 2015-10-29: 60 mg via INTRAVENOUS

## 2015-10-29 MED ORDER — ONDANSETRON HCL 4 MG/2ML IJ SOLN
INTRAMUSCULAR | Status: DC | PRN
  Administered 2015-10-29: 4 mg via INTRAVENOUS

## 2015-10-29 SURGICAL SUPPLY — 49 items
BAG URINE DRAINAGE (UROLOGICAL SUPPLIES) ×3 IMPLANT
BAG URO DRAIN 2000ML W/SPOUT (MISCELLANEOUS) ×3 IMPLANT
BLADE CLIPPER SURG (BLADE) ×3 IMPLANT
BLADE SURG 15 STRL LF DISP TIS (BLADE) ×1 IMPLANT
BLADE SURG 15 STRL SS (BLADE) ×2
CANISTER SUCT 1200ML W/VALVE (MISCELLANEOUS) ×3 IMPLANT
CATH FOL 2WAY LX 18X5 (CATHETERS) ×3 IMPLANT
CLOSURE WOUND 1/2 X4 (GAUZE/BANDAGES/DRESSINGS) ×1
COVER MAYO STAND STRL (DRAPES) ×3 IMPLANT
DRAPE LAPAROTOMY 77X122 PED (DRAPES) ×3 IMPLANT
DRESSING TELFA 4X3 1S ST N-ADH (GAUZE/BANDAGES/DRESSINGS) ×3 IMPLANT
DRSG TEGADERM 4X4.75 (GAUZE/BANDAGES/DRESSINGS) ×3 IMPLANT
ELECT BLADE 6 FLAT ULTRCLN (ELECTRODE) ×3 IMPLANT
ELECT REM PT RETURN 9FT ADLT (ELECTROSURGICAL) ×3
ELECTRODE REM PT RTRN 9FT ADLT (ELECTROSURGICAL) ×1 IMPLANT
GAUZE FLUFF 18X24 1PLY STRL (GAUZE/BANDAGES/DRESSINGS) ×6 IMPLANT
GAUZE SPONGE 4X4 12PLY STRL (GAUZE/BANDAGES/DRESSINGS) ×3 IMPLANT
GLOVE BIO SURGEON STRL SZ7.5 (GLOVE) ×6 IMPLANT
GOWN STRL REUS W/ TWL LRG LVL3 (GOWN DISPOSABLE) ×2 IMPLANT
GOWN STRL REUS W/TWL LRG LVL3 (GOWN DISPOSABLE) ×4
KIT ACCESSORY AMS 700 PUMP (UROLOGICAL SUPPLIES) ×3 IMPLANT
KIT RM TURNOVER STRD PROC AR (KITS) ×3 IMPLANT
LABEL OR SOLS (LABEL) ×3 IMPLANT
LIQUID BAND (GAUZE/BANDAGES/DRESSINGS) ×3 IMPLANT
NDL SAFETY ECLIPSE 18X1.5 (NEEDLE) ×1 IMPLANT
NEEDLE HYPO 18GX1.5 SHARP (NEEDLE) ×2
NS IRRIG 1000ML POUR BTL (IV SOLUTION) ×3 IMPLANT
PACK BASIN MAJOR ARMC (MISCELLANEOUS) ×3 IMPLANT
PAD ABD DERMACEA PRESS 5X9 (GAUZE/BANDAGES/DRESSINGS) ×3 IMPLANT
PREP PVP WINGED SPONGE (MISCELLANEOUS) ×3 IMPLANT
RETRACTOR DEEP SCROTAL PENILE (Miscellaneous) ×3 IMPLANT
STRIP CLOSURE SKIN 1/2X4 (GAUZE/BANDAGES/DRESSINGS) ×2 IMPLANT
SUPPORETR ATHLETIC LG (MISCELLANEOUS) ×1 IMPLANT
SUPPORT SCROTAL LRG (SOFTGOODS) ×1
SUPPORT SCROTAL LRG NO STRP (SOFTGOODS) ×2 IMPLANT
SUPPORTER ATHLETIC LG (MISCELLANEOUS) ×3
SURGILUBE 2OZ TUBE FLIPTOP (MISCELLANEOUS) ×3 IMPLANT
SUT CHROMIC 3 0 PS 2 (SUTURE) ×3 IMPLANT
SUT CHROMIC 3 0 SH 27 (SUTURE) IMPLANT
SUT CHROMIC 4 0 RB 1X27 (SUTURE) ×3 IMPLANT
SUT PLAIN 3 0 SH 27IN (SUTURE) ×3 IMPLANT
SUT VIC AB 2-0 SH 27 (SUTURE) ×2
SUT VIC AB 2-0 SH 27XBRD (SUTURE) ×1 IMPLANT
SUT VIC AB 4-0 FS2 27 (SUTURE) ×3 IMPLANT
SYR 20CC LL (SYRINGE) ×3 IMPLANT
SYR 50ML LL SCALE MARK (SYRINGE) ×9 IMPLANT
SYR BULB IRRIG 60ML STRL (SYRINGE) ×3 IMPLANT
SYRINGE 10CC LL (SYRINGE) ×12 IMPLANT
WATER STERILE IRR 1000ML POUR (IV SOLUTION) ×3 IMPLANT

## 2015-10-29 NOTE — Discharge Instructions (Signed)

## 2015-10-29 NOTE — Transfer of Care (Signed)
Immediate Anesthesia Transfer of Care Note  Patient: Gerald Jefferson  Procedure(s) Performed: Procedure(s): REVISION PENILE PROTHESIS (N/A)  Patient Location: PACU  Anesthesia Type:General  Level of Consciousness: sedated  Airway & Oxygen Therapy: Patient Spontanous Breathing and Patient connected to face mask oxygen  Post-op Assessment: Report given to RN and Post -op Vital signs reviewed and stable  Post vital signs: Reviewed and stable  Last Vitals:  Vitals:   10/29/15 1207 10/29/15 1539  BP: 120/82 109/76  Pulse: 72 69  Resp: 18 16  Temp: 36.7 C (!) (P) 36 C    Last Pain:  Vitals:   10/29/15 1207  TempSrc: Oral         Complications: No apparent anesthesia complications

## 2015-10-29 NOTE — Anesthesia Preprocedure Evaluation (Signed)
Anesthesia Evaluation  Patient identified by MRN, date of birth, ID band Patient awake    Reviewed: Allergy & Precautions, NPO status , Patient's Chart, lab work & pertinent test results  Airway Mallampati: II       Dental  (+) Upper Dentures   Pulmonary asthma , former smoker,    breath sounds clear to auscultation       Cardiovascular Exercise Tolerance: Good hypertension, Pt. on medications + CAD and + Cardiac Stents   Rhythm:Regular     Neuro/Psych Anxiety Depression    GI/Hepatic GERD  Medicated,  Endo/Other  negative endocrine ROS  Renal/GU negative Renal ROS     Musculoskeletal   Abdominal Normal abdominal exam  (+)   Peds  Hematology  (+) anemia ,   Anesthesia Other Findings   Reproductive/Obstetrics                             Anesthesia Physical Anesthesia Plan  ASA: II  Anesthesia Plan: General   Post-op Pain Management:    Induction: Intravenous  Airway Management Planned: LMA  Additional Equipment:   Intra-op Plan:   Post-operative Plan: Extubation in OR  Informed Consent: I have reviewed the patients History and Physical, chart, labs and discussed the procedure including the risks, benefits and alternatives for the proposed anesthesia with the patient or authorized representative who has indicated his/her understanding and acceptance.     Plan Discussed with: CRNA  Anesthesia Plan Comments:         Anesthesia Quick Evaluation

## 2015-10-29 NOTE — Interval H&P Note (Signed)
History and Physical Interval Note:  10/29/2015 12:48 PM  Gerald Jefferson  has presented today for surgery, with the diagnosis of ERECTILE DYSFUNCTION  The various methods of treatment have been discussed with the patient and family. After consideration of risks, benefits and other options for treatment, the patient has consented to  Procedure(s): PENILE PROTHESIS INFLATABLE/ REPLACEMENT (N/A) as a surgical intervention .  The patient's history has been reviewed, patient examined, no change in status, stable for surgery.  I have reviewed the patient's chart and labs.  Questions were answered to the patient's satisfaction.    RRR Lungs clear  Nickie Retort

## 2015-10-29 NOTE — Anesthesia Postprocedure Evaluation (Signed)
Anesthesia Post Note  Patient: Gerald Jefferson  Procedure(s) Performed: Procedure(s) (LRB): REVISION PENILE PROTHESIS (N/A)  Patient location during evaluation: PACU Anesthesia Type: General Level of consciousness: awake and alert Pain management: pain level controlled Vital Signs Assessment: post-procedure vital signs reviewed and stable Respiratory status: spontaneous breathing, nonlabored ventilation and respiratory function stable Cardiovascular status: blood pressure returned to baseline and stable Postop Assessment: no signs of nausea or vomiting Anesthetic complications: no    Last Vitals:  Vitals:   10/29/15 1614 10/29/15 1627  BP: 134/89 133/87  Pulse: 84 77  Resp: 10 16  Temp:  36.1 C    Last Pain:  Vitals:   10/29/15 1627  TempSrc: Tympanic  PainSc: 4                  Elise Knobloch

## 2015-10-29 NOTE — H&P (View-Only) (Signed)
10/08/2015 9:40 AM   Gerald Jefferson 04/13/1955 [id number]  Referring provider: [name], MD [address] [place], [place] [address]  Chief Complaint  Patient presents with  . Erectile Dysfunction    penile implant     HPI: The patient is a 60 year old gentleman with a past medical history of erectile dysfunction and Peyronie's disease status post IPP placement in April 2017 presents today for follow-up. He reports difficulty in being able to inflate his prosthesis. He is only able to squeeze the bulb approximately 3 times before becomes impossible. It takes an extreme amount of force to even squeeze at least 3 times. His unable to obtain a full erection.   PMH: Past Medical History:  Diagnosis Date  . Acid reflux   . Anemia 2009   while he was on Warfarin, Aspirin and Plavix. Resolved.   . Anxiety   . Arthritis   . Asthma   . Back pain    lower  . Blood in stool   . Clotting disorder ([place])    DVT-leg and lung  . Coronary artery disease   . Depression   . ED (erectile dysfunction)   . Essential hypertension   . Hyperlipidemia   . Pulmonary embolism ([place])   . RLS (restless legs syndrome)     Surgical History: Past Surgical History:  Procedure Laterality Date  . CARDIAC CATHETERIZATION  07/2007   Patent LAD/OM stents. Stable 40% stenosis in proximal RCA  . CORONARY ANGIOPLASTY WITH STENT PLACEMENT  04/2006   Cypher DES to LAD and OM1  . IVC FILTER PLACEMENT ([facility] HX)    . NOSE SURGERY    . PENILE PROSTHESIS IMPLANT N/A 05/20/2015   Procedure: PENILE PROTHESIS INFLATABLE;  Surgeon: Nickie Retort, MD;  Location: [facility] ORS;  Service: Urology;  Laterality: N/A;  . PERIPHERAL VASCULAR CATHETERIZATION N/A 07/08/2015   Procedure: IVC Filter Removal;  Surgeon: Algernon Huxley, MD;  Location: [place] [facility];  Service: Cardiovascular;  Laterality: N/A;  . TOTAL HIP ARTHROPLASTY      Home Medications:    Medication List       Accurate as of 10/08/15   9:40 AM. Always use your most recent med list.          amLODipine 10 MG tablet Commonly known as:  NORVASC TAKE 1 TABLET BY MOUTH EVERY DAY IN THE MORNING   ANADROL-50 50 MG Tabs Generic drug:  Oxymetholone Reported on 05/25/2015   aspirin EC 81 MG tablet Take 81 mg by mouth daily.   Azelastine HCl 0.15 % Soln 1 SPRAY/NARES DAILY   budesonide-formoterol 80-4.5 MCG/ACT inhaler Commonly known as:  SYMBICORT Inhale 2 puffs into the lungs 2 (two) times daily.   busPIRone 7.5 MG tablet Commonly known as:  BUSPAR Take 7.5 mg by mouth 2 (two) times daily.   butalbital-acetaminophen-caffeine 50-325-40-30 MG capsule Commonly known as:  FIORICET WITH CODEINE TAKE 1 CAPSULE EVERY 4-6 HOURS AS NEEDED   carbamide peroxide 6.5 % otic solution Commonly known as:  DEBROX Place 5 drops into both ears 2 (two) times daily.   clonazePAM 1 MG tablet Commonly known as:  KLONOPIN Take 1 mg by mouth every morning.   [employer] POTASSIUM GLUCONATE 2 MEQ Tabs Generic drug:  Potassium Gluconate Take by mouth.   cyanocobalamin 1000 MCG tablet Take 1 tablet (1,000 mcg total) by mouth daily.   cyclobenzaprine 10 MG tablet Commonly known as:  FLEXERIL Take 1 tablet by mouth 3 (three) times daily  as needed for muscle spasms. Reported on 05/25/2015   dronabinol 2.5 MG capsule Commonly known as:  MARINOL Take 2.5 mg by mouth 2 (two) times daily before a meal.   ferrous sulfate 325 (65 FE) MG tablet Take 325 mg by mouth daily.   fexofenadine 180 MG tablet Commonly known as:  ALLEGRA Take 180 mg by mouth daily.   gabapentin 600 MG tablet Commonly known as:  NEURONTIN Take 600 mg by mouth 3 (three) times daily.   methocarbamol 750 MG tablet Commonly known as:  ROBAXIN-750 Take 2 tablets (1,500 mg total) by mouth 4 (four) times daily.   omeprazole 20 MG capsule Commonly known as:  PRILOSEC Take 1 capsule (20 mg total) by mouth daily.   promethazine 25 MG tablet Commonly known as:   PHENERGAN Take 25 mg by mouth every 6 (six) hours as needed for nausea or vomiting.   ramipril 10 MG capsule Commonly known as:  ALTACE Take 1 capsule (10 mg total) by mouth daily.   rOPINIRole 0.25 MG tablet Commonly known as:  REQUIP Take 0.25 mg by mouth at bedtime.   rosuvastatin 20 MG tablet Commonly known as:  CRESTOR Take 20 mg by mouth at bedtime.   tiZANidine 2 MG tablet Commonly known as:  ZANAFLEX Take 2 mg by mouth at bedtime.   ZETIA 10 MG tablet Generic drug:  ezetimibe Reported on 05/20/2015       Allergies: No Known Allergies  Family History: Family History  Problem Relation Age of Onset  . Hypertension Mother   . Kidney cancer Neg Hx   . Kidney disease Neg Hx   . Prostate cancer Neg Hx     Social History:  reports that he quit smoking about 9 years ago. His smoking use included Cigarettes. He has a 45.00 pack-year smoking history. He has never used smokeless tobacco. He reports that he does not drink alcohol or use drugs.  ROS: UROLOGY Frequent Urination?: No Hard to postpone urination?: No Burning/pain with urination?: No Get up at night to urinate?: Yes Leakage of urine?: No Urine stream starts and stops?: No Trouble starting stream?: No Do you have to strain to urinate?: No Blood in urine?: No Urinary tract infection?: No Sexually transmitted disease?: No Injury to kidneys or bladder?: No Painful intercourse?: No Weak stream?: No Erection problems?: No Penile pain?: No  Gastrointestinal Nausea?: No Vomiting?: No Indigestion/heartburn?: No Diarrhea?: No Constipation?: No  Constitutional Fever: No Night sweats?: No Weight loss?: No  Skin Skin rash/lesions?: No Itching?: No  Eyes Blurred vision?: No Double vision?: No  Ears/Nose/Throat Sore throat?: No Sinus problems?: No  Hematologic/Lymphatic Swollen glands?: No Easy bruising?: No  Cardiovascular Leg swelling?: No Chest pain?: No  Respiratory Cough?:  No Shortness of breath?: No  Endocrine Excessive thirst?: No  Musculoskeletal Back pain?: Yes Joint pain?: No  Neurological Headaches?: No Dizziness?: No  Psychologic Depression?: Yes Anxiety?: No  Physical Exam: BP 111/71   Pulse 79   Ht 6' (1.829 m)   Wt 146 lb 14.4 oz (66.6 kg)   BMI 19.92 kg/m   Constitutional:  Alert and oriented, No acute distress. HEENT: [place] AT, moist mucus membranes.  Trachea midline, no masses. Cardiovascular: No clubbing, cyanosis, or edema. Respiratory: Normal respiratory effort, no increased work of breathing. GI: Abdomen is soft, nontender, nondistended, no abdominal masses GU: No CVA tenderness. In testing the patient's penile prosthesis, it is impossible to fully inflate. It is difficult to squeeze the inflation bulb. It appears  the inflation mechanism has failed and the device is defective. Skin: No rashes, bruises or suspicious lesions. Lymph: No cervical or inguinal adenopathy. Neurologic: Grossly intact, no focal deficits, moving all 4 extremities. Psychiatric: Normal mood and affect.  Laboratory Data: Lab Results  Component Value Date   WBC 6.1 05/21/2015   HGB 11.8 (L) 05/21/2015   HCT 33.5 (L) 05/21/2015   MCV 89.4 05/21/2015   PLT 164 05/21/2015    Lab Results  Component Value Date   CREATININE 1.01 05/21/2015    No results found for: PSA  No results found for: TESTOSTERONE  No results found for: HGBA1C  Urinalysis No results found for: COLORURINE, APPEARANCEUR, LABSPEC, PHURINE, GLUCOSEU, HGBUR, BILIRUBINUR, KETONESUR, PROTEINUR, UROBILINOGEN, NITRITE, LEUKOCYTESUR   Assessment & Plan:    1. Erectile dysfunction 2. Peyronie's disease I discussed with the patient that his inflatable prosthesis appears to have become effective. I discussed that the only option at this time would be to replace his prosthesis. We discussed the risks, benefits, and indications of this procedure. He is familiar with the procedure as he  has had it done before. All questions were answered. We'll plan for replacement of his inflatable penile prosthesis.   Nickie Retort, MD  [facility] [address] [place], [place] [address] [phone]

## 2015-10-29 NOTE — Op Note (Signed)
Date of procedure: 10/29/15  Preoperative diagnosis:  1. Erectile dysfunction   Postoperative diagnosis:  1. Erectile dysfunction   Procedure: 1. Revision of inflatable penile prosthesis  Surgeon: Baruch Gouty, MD  Anesthesia: General  Complications: None  Intraoperative findings: The patient's tubing between his penile prosthesis pump and reservoir become kinked as it scarred in. This excess tubing was reduced in size. I was easily able to cycle his penile prosthesis after decreasing the amount of tubing.  EBL: Minimal  Specimens: None  Drains: 16 French Foley catheter  Disposition: Stable to the postanesthesia care unit  Indication for procedure: The patient is a 61 y.o. male with erectile dysfunction. He returns the OR after his inflatable penile prosthesis no longer functions..  After reviewing the management options for treatment, the patient elected to proceed with the above surgical procedure(s). We have discussed the potential benefits and risks of the procedure, side effects of the proposed treatment, the likelihood of the patient achieving the goals of the procedure, and any potential problems that might occur during the procedure or recuperation. Informed consent has been obtained.  Description of procedure: The patient was met in the preoperative area. All risks, benefits, and indications of the procedure were described in great detail. The patient consented to the procedure. Preoperative antibiotics were given. The patient was taken to the operative theater. General anesthesia was induced per the anesthesia service. The patient was then placed in the suping position and prepped and draped in the usual sterile fashion.  A ten minute scrub was instituted for prepping. A preoperative timeout was called.   53 French Foley catheter was placed and clamped after draining the bladder. A midline penoscrotal incision was then reopened along the scar line. The prosthesis tubing was  freed from surrounding scar tissue. The penile pump was removed from the inferior scrotum. At this point, it was noted that he had excess tubing of his tube expected his reservoir to the pump. Upon stretching the tubing out unkinking, the prosthesis was easily cycled with a good erection. At this point the problem was thought to be due to excess tubing and how it kinked during scarring. Excess tubing was removed by placing a rubber shod proximally and distally. Excess tubing was removed. Tubing is refilled with normal saline. It was then clamped together with the clamping device and white clamp pieces. As completed the circuit. The rubber shods were removed. At this point, was very easy to cycle the prosthesis. The pump was then placed in the inferior scrotum and secured in location with 3-0 chromic suture. The remaining tubing was then covered with dartos tissue with a running 3-0 chromic. Skin was reapproximated subcutaneous testicular with a running 4-0 Vicryl. Skin was then applied. Scrotal support was placed. The patient was then woken from anesthesia and transferred in stable condition to the postanesthesia care unit.  Plan: The patient will return to the office tomorrow for Foley catheter removal. He'll see me for a wound check and device testing in 6 weeks. He was again instructed not to use the device until cleared in our office.  Baruch Gouty, M.D.

## 2015-10-29 NOTE — Anesthesia Procedure Notes (Signed)
Procedure Name: LMA Insertion Date/Time: 10/29/2015 2:26 PM Performed by: Just[facility] Pre-anesthesia Checklist: Patient identified, Emergency Drugs available, Suction available and Patient being monitored Patient Re-evaluated:Patient Re-evaluated prior to inductionOxygen Delivery Method: Circle system utilized Preoxygenation: Pre-oxygenation with 100% oxygen Intubation Type: IV induction Ventilation: Mask ventilation without difficulty LMA: LMA inserted LMA Size: 4.5 Placement Confirmation: positive ETCO2,  CO2 detector and breath sounds checked- equal and bilateral Dental Injury: Teeth and Oropharynx as per pre-operative assessment

## 2015-10-29 NOTE — Telephone Encounter (Signed)
-----   Message from Nickie Retort, MD sent at 10/29/2015  3:34 PM EDT ----- Patient needs a nurse visit tomorrow for foley removal. He needs to see me in 6 weeks for wound check

## 2015-10-30 ENCOUNTER — Ambulatory Visit: Payer: BLUE CROSS/BLUE SHIELD

## 2015-10-30 ENCOUNTER — Telehealth: Payer: Self-pay | Admitting: Urology

## 2015-10-30 DIAGNOSIS — Z9889 Other specified postprocedural states: Secondary | ICD-10-CM

## 2015-10-30 NOTE — Telephone Encounter (Signed)
done

## 2015-10-30 NOTE — Progress Notes (Signed)
Pt came in today for foley removal. 10cc was removed from balloon. Foley was removed with out difficulty. Pt tolerated well. No s/s of adverse reaction noted.

## 2015-11-03 ENCOUNTER — Other Ambulatory Visit: Payer: BLUE CROSS/BLUE SHIELD

## 2015-12-11 ENCOUNTER — Ambulatory Visit (INDEPENDENT_AMBULATORY_CARE_PROVIDER_SITE_OTHER): Payer: BLUE CROSS/BLUE SHIELD | Admitting: Urology

## 2015-12-11 ENCOUNTER — Encounter: Payer: Self-pay | Admitting: Urology

## 2015-12-11 VITALS — BP 121/74 | HR 76 | Ht 72.0 in | Wt 145.7 lb

## 2015-12-11 DIAGNOSIS — N486 Induration penis plastica: Secondary | ICD-10-CM

## 2015-12-11 DIAGNOSIS — N529 Male erectile dysfunction, unspecified: Secondary | ICD-10-CM

## 2015-12-11 NOTE — Progress Notes (Signed)
12/11/2015 2:20 PM   Tiney Rouge June Leap 1955/12/31 PT:[id number]  Referring provider: [name], MD [address] [place], [place] [address]  Chief Complaint  Patient presents with  . Routine Post Op    penile implant     HPI: The patient is a 60 year old gentleman who returns for a postoperative check after undergoing a revision of his penile prosthesis on 10/29/2015. At that time, it was found that tubing to his reservoir had become kinked as scar tissue formed. This was revised without exchanging his prosthesis. He has done well since the procedure. He has no complaints at this time.  He has tried cycling his device a few times recently. He has not had intercourse. He is much happier with the results at this time. The tubing is no longer bothering him. The device cycles much easier now.   PMH: Past Medical History:  Diagnosis Date  . Acid reflux   . Anemia 2009   while he was on Warfarin, Aspirin and Plavix. Resolved.   . Anxiety   . Arthritis   . Asthma   . Back pain    lower  . Blood in stool   . Clotting disorder ([place])    DVT-leg and lung  . Coronary artery disease   . Depression   . ED (erectile dysfunction)   . Essential hypertension   . Hyperlipidemia   . Pulmonary embolism ([place])   . RLS (restless legs syndrome)     Surgical History: Past Surgical History:  Procedure Laterality Date  . CARDIAC CATHETERIZATION  07/2007   Patent LAD/OM stents. Stable 40% stenosis in proximal RCA  . CORONARY ANGIOPLASTY WITH STENT PLACEMENT  04/2006   Cypher DES to LAD and OM1  . IVC FILTER PLACEMENT ([facility] HX)    . JOINT REPLACEMENT    . NOSE SURGERY    . PENILE PROSTHESIS IMPLANT N/A 05/20/2015   Procedure: PENILE PROTHESIS INFLATABLE;  Surgeon: Nickie Retort, MD;  Location: [facility] ORS;  Service: Urology;  Laterality: N/A;  . PENILE PROSTHESIS IMPLANT N/A 10/29/2015   Procedure: REVISION PENILE PROTHESIS;  Surgeon: Nickie Retort, MD;  Location: [facility] ORS;  Service:  Urology;  Laterality: N/A;  . PERIPHERAL VASCULAR CATHETERIZATION N/A 07/08/2015   unsuccessful, was not removed, only attempted Procedure: IVC Filter Removal;  Surgeon: Algernon Huxley, MD;  Location: [facility];  Service: Cardiovascular;  Laterality: N/A;  . TOTAL HIP ARTHROPLASTY Left     Home Medications:    Medication List       Accurate as of 12/11/15  2:20 PM. Always use your most recent med list.          amLODipine 10 MG tablet Commonly known as:  NORVASC TAKE 1 TABLET BY MOUTH EVERY DAY IN THE MORNING   aspirin EC 81 MG tablet Take 81 mg by mouth daily.   Azelastine HCl 0.15 % Soln 1 SPRAY/NARES DAILY   budesonide-formoterol 80-4.5 MCG/ACT inhaler Commonly known as:  SYMBICORT Inhale 2 puffs into the lungs 2 (two) times daily.   busPIRone 7.5 MG tablet Commonly known as:  BUSPAR Take 15 mg by mouth 2 (two) times daily.   clonazePAM 1 MG tablet Commonly known as:  KLONOPIN Take 1 mg by mouth every morning.   cloNIDine 0.2 MG tablet Commonly known as:  CATAPRES Take 0.2 mg by mouth 2 (two) times daily.   [employer] POTASSIUM GLUCONATE 2 MEQ Tabs Generic drug:  Potassium Gluconate Take by mouth.   cyanocobalamin  1000 MCG tablet Take 1 tablet (1,000 mcg total) by mouth daily.   dronabinol 2.5 MG capsule Commonly known as:  MARINOL Take 2.5 mg by mouth 2 (two) times daily before a meal.   ferrous sulfate 325 (65 FE) MG tablet Take 325 mg by mouth daily.   fexofenadine 180 MG tablet Commonly known as:  ALLEGRA Take 180 mg by mouth daily.   gabapentin 600 MG tablet Commonly known as:  NEURONTIN Take 600 mg by mouth 3 (three) times daily.   HYDROcodone-acetaminophen 10-325 MG tablet Commonly known as:  NORCO Take 1 tablet by mouth every 6 (six) hours as needed. Takes qid daily   omeprazole 20 MG capsule Commonly known as:  PRILOSEC Take 1 capsule (20 mg total) by mouth daily.   promethazine 25 MG tablet Commonly known as:  PHENERGAN Take 25 mg  by mouth every 6 (six) hours as needed for nausea or vomiting.   ramipril 10 MG capsule Commonly known as:  ALTACE Take 1 capsule (10 mg total) by mouth daily.   rOPINIRole 0.25 MG tablet Commonly known as:  REQUIP Take 0.25 mg by mouth at bedtime.   rosuvastatin 20 MG tablet Commonly known as:  CRESTOR Take 20 mg by mouth at bedtime.   tiZANidine 2 MG tablet Commonly known as:  ZANAFLEX Take 2 mg by mouth at bedtime.       Allergies: No Known Allergies  Family History: Family History  Problem Relation Age of Onset  . Hypertension Mother   . Kidney cancer Neg Hx   . Kidney disease Neg Hx   . Prostate cancer Neg Hx     Social History:  reports that he quit smoking about 9 years ago. His smoking use included Cigarettes. He has a 45.00 pack-year smoking history. He has never used smokeless tobacco. He reports that he does not drink alcohol or use drugs.  ROS: UROLOGY Frequent Urination?: No Hard to postpone urination?: No Burning/pain with urination?: No Get up at night to urinate?: No Leakage of urine?: No Urine stream starts and stops?: No Trouble starting stream?: No Do you have to strain to urinate?: No Blood in urine?: No Urinary tract infection?: No Sexually transmitted disease?: No Injury to kidneys or bladder?: No Painful intercourse?: No Weak stream?: No Erection problems?: No Penile pain?: No  Gastrointestinal Nausea?: No Vomiting?: No Indigestion/heartburn?: No Diarrhea?: No Constipation?: No  Constitutional Fever: No Night sweats?: No Weight loss?: No Fatigue?: No  Skin Skin rash/lesions?: No Itching?: No  Eyes Blurred vision?: No Double vision?: No  Ears/Nose/Throat Sore throat?: No Sinus problems?: No  Hematologic/Lymphatic Swollen glands?: No Easy bruising?: No  Cardiovascular Leg swelling?: No Chest pain?: No  Respiratory Cough?: No Shortness of breath?: No  Endocrine Excessive thirst?: No  Musculoskeletal Back  pain?: No Joint pain?: No  Neurological Headaches?: No Dizziness?: No  Psychologic Depression?: No Anxiety?: No  Physical Exam: BP 121/74   Pulse 76   Ht 6' (1.829 m)   Wt 145 lb 11.2 oz (66.1 kg)   BMI 19.76 kg/m   Constitutional:  Alert and oriented, No acute distress. HEENT: [place] AT, moist mucus membranes.  Trachea midline, no masses. Cardiovascular: No clubbing, cyanosis, or edema. Respiratory: Normal respiratory effort, no increased work of breathing. GI: Abdomen is soft, nontender, nondistended, no abdominal masses GU: No CVA tenderness. IPP in place. Incisions fill well. Able to easily cycle his device between flaccid and erect state. Skin: No rashes, bruises or suspicious lesions. Lymph: No cervical  or inguinal adenopathy. Neurologic: Grossly intact, no focal deficits, moving all 4 extremities. Psychiatric: Normal mood and affect.  Laboratory Data: Lab Results  Component Value Date   WBC 5.5 10/28/2015   HGB 14.4 10/28/2015   HCT 41.8 10/28/2015   MCV 88.1 10/28/2015   PLT 216 10/28/2015    Lab Results  Component Value Date   CREATININE 1.01 10/28/2015    No results found for: PSA  No results found for: TESTOSTERONE  No results found for: HGBA1C  Urinalysis    Component Value Date/Time   APPEARANCEUR Clear 10/08/2015 0857   GLUCOSEU Negative 10/08/2015 0857   BILIRUBINUR Negative 10/08/2015 0857   PROTEINUR Negative 10/08/2015 0857   NITRITE Negative 10/08/2015 0857   LEUKOCYTESUR Negative 10/08/2015 0857      Assessment & Plan:    1. Erectile dysfunction 2. Peyronie's disease His IPP functions well after revision. It cycles without difficulty. He is cleared to use this device for intercourse at this time. He will follow-up when necessary.  Return if symptoms worsen or fail to improve.  Nickie Retort, MD  [facility] [address], [place], [place] [address] [phone]

## 2016-02-21 ENCOUNTER — Other Ambulatory Visit: Payer: Self-pay | Admitting: Cardiovascular Disease

## 2016-02-29 ENCOUNTER — Observation Stay: Payer: BLUE CROSS/BLUE SHIELD

## 2016-02-29 ENCOUNTER — Observation Stay (HOSPITAL_BASED_OUTPATIENT_CLINIC_OR_DEPARTMENT_OTHER)
Admit: 2016-02-29 | Discharge: 2016-02-29 | Disposition: A | Discharge status: Home / Self Care | Payer: BLUE CROSS/BLUE SHIELD | Attending: Physician Assistant | Admitting: Physician Assistant

## 2016-02-29 ENCOUNTER — Observation Stay
Admission: EM | Admit: 2016-02-29 | Discharge: 2016-03-01 | Disposition: A | Discharge status: Home / Self Care | Payer: BLUE CROSS/BLUE SHIELD | Attending: Internal Medicine | Admitting: Internal Medicine

## 2016-02-29 ENCOUNTER — Encounter: Payer: Self-pay | Admitting: Emergency Medicine

## 2016-02-29 ENCOUNTER — Emergency Department: Payer: BLUE CROSS/BLUE SHIELD

## 2016-02-29 DIAGNOSIS — R0789 Other chest pain: Secondary | ICD-10-CM | POA: Diagnosis present

## 2016-02-29 DIAGNOSIS — G459 Transient cerebral ischemic attack, unspecified: Secondary | ICD-10-CM

## 2016-02-29 DIAGNOSIS — F419 Anxiety disorder, unspecified: Secondary | ICD-10-CM | POA: Insufficient documentation

## 2016-02-29 DIAGNOSIS — I10 Essential (primary) hypertension: Secondary | ICD-10-CM | POA: Diagnosis present

## 2016-02-29 DIAGNOSIS — Z87891 Personal history of nicotine dependence: Secondary | ICD-10-CM | POA: Insufficient documentation

## 2016-02-29 DIAGNOSIS — G2581 Restless legs syndrome: Secondary | ICD-10-CM | POA: Diagnosis not present

## 2016-02-29 DIAGNOSIS — R0602 Shortness of breath: Secondary | ICD-10-CM | POA: Diagnosis not present

## 2016-02-29 DIAGNOSIS — Z96642 Presence of left artificial hip joint: Secondary | ICD-10-CM | POA: Insufficient documentation

## 2016-02-29 DIAGNOSIS — R200 Anesthesia of skin: Principal | ICD-10-CM | POA: Diagnosis present

## 2016-02-29 DIAGNOSIS — Z86711 Personal history of pulmonary embolism: Secondary | ICD-10-CM

## 2016-02-29 DIAGNOSIS — E785 Hyperlipidemia, unspecified: Secondary | ICD-10-CM | POA: Diagnosis not present

## 2016-02-29 DIAGNOSIS — Z7982 Long term (current) use of aspirin: Secondary | ICD-10-CM | POA: Insufficient documentation

## 2016-02-29 DIAGNOSIS — J449 Chronic obstructive pulmonary disease, unspecified: Secondary | ICD-10-CM | POA: Insufficient documentation

## 2016-02-29 DIAGNOSIS — R202 Paresthesia of skin: Secondary | ICD-10-CM

## 2016-02-29 DIAGNOSIS — K219 Gastro-esophageal reflux disease without esophagitis: Secondary | ICD-10-CM | POA: Insufficient documentation

## 2016-02-29 DIAGNOSIS — Z95828 Presence of other vascular implants and grafts: Secondary | ICD-10-CM

## 2016-02-29 DIAGNOSIS — I2510 Atherosclerotic heart disease of native coronary artery without angina pectoris: Secondary | ICD-10-CM | POA: Diagnosis not present

## 2016-02-29 DIAGNOSIS — I25118 Atherosclerotic heart disease of native coronary artery with other forms of angina pectoris: Secondary | ICD-10-CM

## 2016-02-29 DIAGNOSIS — R079 Chest pain, unspecified: Secondary | ICD-10-CM

## 2016-02-29 DIAGNOSIS — Z955 Presence of coronary angioplasty implant and graft: Secondary | ICD-10-CM | POA: Diagnosis not present

## 2016-02-29 DIAGNOSIS — Z7951 Long term (current) use of inhaled steroids: Secondary | ICD-10-CM | POA: Insufficient documentation

## 2016-02-29 DIAGNOSIS — E782 Mixed hyperlipidemia: Secondary | ICD-10-CM

## 2016-02-29 DIAGNOSIS — Z79899 Other long term (current) drug therapy: Secondary | ICD-10-CM | POA: Diagnosis not present

## 2016-02-29 LAB — COMPREHENSIVE METABOLIC PANEL
ALBUMIN: 4.5 g/dL (ref 3.5–5.0)
ALT: 14 U/L — ABNORMAL LOW (ref 17–63)
AST: 17 U/L (ref 15–41)
Alkaline Phosphatase: 76 U/L (ref 38–126)
Anion gap: 8 (ref 5–15)
BILIRUBIN TOTAL: 0.5 mg/dL (ref 0.3–1.2)
BUN: 26 mg/dL — AB (ref 6–20)
CO2: 25 mmol/L (ref 22–32)
Calcium: 9.4 mg/dL (ref 8.9–10.3)
Chloride: 108 mmol/L (ref 101–111)
Creatinine, Ser: 1.02 mg/dL (ref 0.61–1.24)
GFR calc Af Amer: >60 mL/min (ref >60)
GFR calc non Af Amer: >60 mL/min (ref >60)
GLUCOSE: 95 mg/dL (ref 65–99)
POTASSIUM: 3.7 mmol/L (ref 3.5–5.1)
SODIUM: 141 mmol/L (ref 135–145)
TOTAL PROTEIN: 7.6 g/dL (ref 6.5–8.1)

## 2016-02-29 LAB — CBC
HEMATOCRIT: 41.7 % (ref 40.0–52.0)
HEMOGLOBIN: 14.3 g/dL (ref 13.0–18.0)
MCH: 30.5 pg (ref 26.0–34.0)
MCHC: 34.3 g/dL (ref 32.0–36.0)
MCV: 88.9 fL (ref 80.0–100.0)
Platelets: 208 10*3/uL (ref 150–440)
RBC: 4.69 MIL/uL (ref 4.40–5.90)
RDW: 13.9 % (ref 11.5–14.5)
WBC: 6.6 10*3/uL (ref 3.8–10.6)

## 2016-02-29 LAB — TROPONIN I
Troponin I: <0.03 ng/mL (ref <0.03)
Troponin I: <0.03 ng/mL (ref <0.03)
Troponin I: <0.03 ng/mL (ref <0.03)

## 2016-02-29 LAB — FIBRIN DERIVATIVES D-DIMER (ARMC ONLY): Fibrin derivatives D-dimer (ARMC): 469 (ref 0–499)

## 2016-02-29 MED ORDER — HYDROCODONE-ACETAMINOPHEN 10-325 MG PO TABS
1.0000 | ORAL_TABLET | ORAL | Status: DC | PRN
  Administered 2016-02-29 – 2016-03-01 (×5): 1 via ORAL
  Filled 2016-02-29 (×5): qty 1

## 2016-02-29 MED ORDER — CLONIDINE HCL 0.1 MG PO TABS
0.2000 mg | ORAL_TABLET | Freq: Two times a day (BID) | ORAL | Status: DC
  Administered 2016-02-29: 0.2 mg via ORAL
  Filled 2016-02-29: qty 1
  Filled 2016-02-29: qty 2

## 2016-02-29 MED ORDER — GABAPENTIN 300 MG PO CAPS
600.0000 mg | ORAL_CAPSULE | Freq: Three times a day (TID) | ORAL | Status: DC
  Administered 2016-02-29 – 2016-03-01 (×3): 600 mg via ORAL
  Filled 2016-02-29 (×3): qty 2

## 2016-02-29 MED ORDER — FERROUS SULFATE 325 (65 FE) MG PO TABS
325.0000 mg | ORAL_TABLET | Freq: Every day | ORAL | Status: DC
  Administered 2016-02-29 – 2016-03-01 (×2): 325 mg via ORAL
  Filled 2016-02-29 (×2): qty 1

## 2016-02-29 MED ORDER — ASPIRIN EC 81 MG PO TBEC
81.0000 mg | DELAYED_RELEASE_TABLET | Freq: Every day | ORAL | Status: DC
  Administered 2016-03-01: 81 mg via ORAL
  Filled 2016-02-29: qty 1

## 2016-02-29 MED ORDER — VITAMIN B-12 1000 MCG PO TABS
1000.0000 ug | ORAL_TABLET | Freq: Every day | ORAL | Status: DC
  Administered 2016-02-29 – 2016-03-01 (×2): 1000 ug via ORAL
  Filled 2016-02-29 (×2): qty 1

## 2016-02-29 MED ORDER — ROSUVASTATIN CALCIUM 20 MG PO TABS
20.0000 mg | ORAL_TABLET | Freq: Every day | ORAL | Status: DC
  Administered 2016-02-29: 20 mg via ORAL
  Filled 2016-02-29: qty 1

## 2016-02-29 MED ORDER — ROPINIROLE HCL 0.25 MG PO TABS
0.2500 mg | ORAL_TABLET | Freq: Every day | ORAL | Status: DC
  Administered 2016-02-29: 0.25 mg via ORAL
  Filled 2016-02-29: qty 1

## 2016-02-29 MED ORDER — CLONIDINE HCL 0.1 MG PO TABS
0.1000 mg | ORAL_TABLET | Freq: Two times a day (BID) | ORAL | Status: DC
  Administered 2016-02-29 – 2016-03-01 (×2): 0.1 mg via ORAL
  Filled 2016-02-29 (×2): qty 1

## 2016-02-29 MED ORDER — NITROGLYCERIN 2 % TD OINT
0.5000 [in_us] | TOPICAL_OINTMENT | Freq: Four times a day (QID) | TRANSDERMAL | Status: DC
Start: 2016-02-29 — End: 2016-02-29
  Administered 2016-02-29 (×2): 0.5 [in_us] via TOPICAL
  Filled 2016-02-29 (×2): qty 1

## 2016-02-29 MED ORDER — METOPROLOL TARTRATE 25 MG PO TABS: 25.0000 mg | ORAL_TABLET | Freq: Two times a day (BID) | ORAL | Status: DC

## 2016-02-29 MED ORDER — PANTOPRAZOLE SODIUM 40 MG PO TBEC
40.0000 mg | DELAYED_RELEASE_TABLET | Freq: Every day | ORAL | Status: DC
Start: 2016-02-29 — End: 2016-03-01
  Administered 2016-02-29 – 2016-03-01 (×2): 40 mg via ORAL
  Filled 2016-02-29 (×2): qty 1

## 2016-02-29 MED ORDER — RAMIPRIL 5 MG PO CAPS
10.0000 mg | ORAL_CAPSULE | Freq: Every day | ORAL | Status: DC
  Administered 2016-03-01: 10 mg via ORAL
  Filled 2016-02-29 (×2): qty 2

## 2016-02-29 MED ORDER — NITROGLYCERIN 0.4 MG SL SUBL
0.4000 mg | SUBLINGUAL_TABLET | Freq: Once | SUBLINGUAL | Status: AC
  Administered 2016-02-29: 0.4 mg via SUBLINGUAL
  Filled 2016-02-29: qty 1

## 2016-02-29 MED ORDER — ACETAMINOPHEN 325 MG PO TABS
650.0000 mg | ORAL_TABLET | Freq: Four times a day (QID) | ORAL | Status: DC | PRN
  Administered 2016-02-29: 650 mg via ORAL
  Filled 2016-02-29: qty 2

## 2016-02-29 MED ORDER — ENOXAPARIN SODIUM 40 MG/0.4ML ~~LOC~~ SOLN
40.0000 mg | SUBCUTANEOUS | Status: DC
  Administered 2016-02-29: 40 mg via SUBCUTANEOUS
  Filled 2016-02-29: qty 0.4

## 2016-02-29 MED ORDER — DRONABINOL 2.5 MG PO CAPS
2.5000 mg | ORAL_CAPSULE | Freq: Two times a day (BID) | ORAL | Status: DC
  Administered 2016-03-01: 2.5 mg via ORAL
  Filled 2016-02-29 (×2): qty 1

## 2016-02-29 MED ORDER — MOMETASONE FURO-FORMOTEROL FUM 100-5 MCG/ACT IN AERO
2.0000 | INHALATION_SPRAY | Freq: Two times a day (BID) | RESPIRATORY_TRACT | Status: DC
Start: 2016-02-29 — End: 2016-03-01
  Administered 2016-02-29 – 2016-03-01 (×3): 2 via RESPIRATORY_TRACT
  Filled 2016-02-29: qty 8.8

## 2016-02-29 NOTE — ED Provider Notes (Signed)
[facility] Emergency Department Provider Note   ____________________________________________    I have reviewed the triage vital signs and the nursing notes.   HISTORY  Chief Complaint Numbness and Chest Pain     HPI Gerald Jefferson is a 61 y.o. male who presents with complaints of left-sided jaw and arm numbness/tingling. He also reports mild chest pressure. Patient has significant cardiac history including drug-eluting stents placed in 2008 as well as a history of pulmonary embolism, complicated by GI bleed. Currently he takes aspirin daily and also see H&P cardiology. Also reports mild shortness of breath. No recent travel. No calf pain or swelling.   Past Medical History:  Diagnosis Date  . Acid reflux   . Anemia 2009   while he was on Warfarin, Aspirin and Plavix. Resolved.   . Anxiety   . Arthritis   . Asthma   . Back pain    lower  . Blood in stool   . Clotting disorder ([place])    DVT-leg and lung  . Coronary artery disease   . Depression   . ED (erectile dysfunction)   . Essential hypertension   . Hyperlipidemia   . Pulmonary embolism ([place])   . RLS (restless legs syndrome)     Patient Active Problem List   Diagnosis Date Noted  . Left sided numbness 02/29/2016  . Chest pressure 02/29/2016  . History of pulmonary embolism 02/29/2016  . S/P IVC filter 02/29/2016  . Erectile dysfunction 05/20/2015  . Myositis 10/27/2014  . Myofasciitis 10/27/2014  . Renal insufficiency 10/27/2014  . Leukocytosis 10/27/2014  . Polyneuropathy ([place]) 08/26/2014  . Hip pain 03/12/2012  . Coronary artery disease   . Hyperlipidemia   . Essential hypertension   . ED (erectile dysfunction)     Past Surgical History:  Procedure Laterality Date  . CARDIAC CATHETERIZATION  07/2007   Patent LAD/OM stents. Stable 40% stenosis in proximal RCA  . CORONARY ANGIOPLASTY WITH STENT PLACEMENT  04/2006   Cypher DES to LAD and OM1  . IVC FILTER PLACEMENT  ([facility] HX)    . JOINT REPLACEMENT    . NOSE SURGERY    . PENILE PROSTHESIS IMPLANT N/A 05/20/2015   Procedure: PENILE PROTHESIS INFLATABLE;  Surgeon: Nickie Retort, MD;  Location: [facility] ORS;  Service: Urology;  Laterality: N/A;  . PENILE PROSTHESIS IMPLANT N/A 10/29/2015   Procedure: REVISION PENILE PROTHESIS;  Surgeon: Nickie Retort, MD;  Location: [facility] ORS;  Service: Urology;  Laterality: N/A;  . PERIPHERAL VASCULAR CATHETERIZATION N/A 07/08/2015   unsuccessful, was not removed, only attempted Procedure: IVC Filter Removal;  Surgeon: Algernon Huxley, MD;  Location: [facility];  Service: Cardiovascular;  Laterality: N/A;  . TOTAL HIP ARTHROPLASTY Left     Prior to Admission medications   Medication Sig Start Date End Date Taking? Authorizing Provider  amLODipine (NORVASC) 10 MG tablet TAKE 1 TABLET BY MOUTH EVERY DAY IN THE MORNING 02/24/15  Yes Historical Provider, MD  aspirin EC 81 MG tablet Take 81 mg by mouth daily.   Yes Historical Provider, MD  Azelastine HCl 0.15 % SOLN 1 SPRAY/NARES DAILY 03/18/15  Yes Historical Provider, MD  budesonide-formoterol (SYMBICORT) 80-4.5 MCG/ACT inhaler Inhale 2 puffs into the lungs 2 (two) times daily.   Yes Historical Provider, MD  busPIRone (BUSPAR) 7.5 MG tablet Take 15 mg by mouth 2 (two) times daily.  04/13/15  Yes Historical Provider, MD  clonazePAM (KLONOPIN) 1 MG tablet Take 1 mg  by mouth every morning. 04/15/15  Yes Historical Provider, MD  fexofenadine (ALLEGRA) 180 MG tablet Take 180 mg by mouth daily.   Yes Historical Provider, MD  gabapentin (NEURONTIN) 600 MG tablet Take 600 mg by mouth 3 (three) times daily.   Yes Historical Provider, MD  HYDROcodone-acetaminophen (NORCO) 10-325 MG tablet Take 1 tablet by mouth every 6 (six) hours as needed. Takes qid daily   Yes Historical Provider, MD  promethazine (PHENERGAN) 25 MG tablet Take 25 mg by mouth every 6 (six) hours as needed for nausea or vomiting.   Yes Historical Provider, MD    ramipril (ALTACE) 10 MG capsule TAKE 1 CAPSULE (10 MG TOTAL) BY MOUTH DAILY. 02/22/16  Yes Wellington Hampshire, MD  rOPINIRole (REQUIP) 0.25 MG tablet Take 0.25 mg by mouth at bedtime.   Yes Historical Provider, MD  tiZANidine (ZANAFLEX) 2 MG tablet Take 2 mg by mouth at bedtime.     Yes Historical Provider, MD  cloNIDine (CATAPRES) 0.2 MG tablet Take 0.2 mg by mouth 2 (two) times daily.    Historical Provider, MD  dronabinol (MARINOL) 2.5 MG capsule Take 2.5 mg by mouth 2 (two) times daily before a meal.    Historical Provider, MD  ferrous sulfate 325 (65 FE) MG tablet Take 325 mg by mouth daily.     Historical Provider, MD  omeprazole (PRILOSEC) 20 MG capsule Take 1 capsule (20 mg total) by mouth daily. 08/29/11   Wellington Hampshire, MD  Potassium Gluconate ([employer]S POTASSIUM GLUCONATE) 2 MEQ TABS Take by mouth.    Historical Provider, MD  rosuvastatin (CRESTOR) 20 MG tablet Take 20 mg by mouth at bedtime.     Historical Provider, MD  vitamin B-12 1000 MCG tablet Take 1 tablet (1,000 mcg total) by mouth daily. 10/30/14   [name], MD     Allergies Patient has no known allergies.  Family History  Problem Relation Age of Onset  . Hypertension Mother   . Kidney cancer Neg Hx   . Kidney disease Neg Hx   . Prostate cancer Neg Hx     Social History Social History  Substance Use Topics  . Smoking status: Former Smoker    Packs/day: 1.50    Years: 30.00    Types: Cigarettes    Quit date: 04/08/2006  . Smokeless tobacco: Never Used  . Alcohol use No    Review of Systems  Constitutional: No fever/chills Eyes: No visual changes.   Cardiovascular: As above Respiratory: Denies shortness of breath. Gastrointestinal: No abdominal pain.  No nausea, no vomiting.    Musculoskeletal: Negative for back pain. Skin: Negative for rash. Neurological: Negative for headaches , no weakness  10-point ROS otherwise negative.  ____________________________________________   PHYSICAL  EXAM:  VITAL SIGNS: ED Triage Vitals [02/29/16 0748]  Enc Vitals Group     BP 135/80     Pulse Rate 64     Resp 20     Temp 98.9 F (37.2 C)     Temp Source Oral     SpO2 99 %     Weight 143 lb (64.9 kg)     Height 6' (1.829 m)     Head Circumference      Peak Flow      Pain Score 5     Pain Loc      Pain Edu?      Excl. in [place]?     Constitutional: Alert and oriented. No acute distress. Pleasant and interactive Eyes:  Conjunctivae are normal.   Nose: No congestion/rhinnorhea. Mouth/Throat: Mucous membranes are moist.   Neck:  Painless ROM Cardiovascular: Normal rate, regular rhythm. Grossly normal heart sounds.  Good peripheral circulation. Respiratory: Normal respiratory effort.  No retractions. Lungs CTAB. Gastrointestinal: Soft and nontender. No distention.  No CVA tenderness. Genitourinary: deferred Musculoskeletal: No lower extremity tenderness nor edema.  Warm and well perfused Neurologic:  Normal speech and language. No gross focal neurologic deficits are appreciated.  Skin:  Skin is warm, dry and intact. No rash noted. Psychiatric: Mood and affect are normal. Speech and behavior are normal.  ____________________________________________   LABS (all labs ordered are listed, but only abnormal results are displayed)  Labs Reviewed  COMPREHENSIVE METABOLIC PANEL - Abnormal; Notable for the following:       Result Value   BUN 26 (*)    ALT 14 (*)    All other components within normal limits  FIBRIN DERIVATIVES D-DIMER ([facility] ONLY)  CBC  TROPONIN I  CBC  CREATININE, SERUM  TROPONIN I  TROPONIN I  HEMOGLOBIN A1C   ____________________________________________  EKG  ED ECG REPORT I, Lavonia Drafts, the attending physician, personally viewed and interpreted this ECG.  Date: 02/29/2016 EKG Time: 8 AM Rate: 59 Rhythm: Sinus bradycardia QRS Axis: normal Intervals: normal ST/T Wave abnormalities: normal Conduction Disturbances: none Narrative  Interpretation: unremarkable  ____________________________________________  RADIOLOGY  Chest x-ray unremarkable ____________________________________________   PROCEDURES  Procedure(s) performed: No    Critical Care performed: No ____________________________________________   INITIAL IMPRESSION / ASSESSMENT AND PLAN / ED COURSE  Pertinent labs & imaging results that were available during my care of the patient were reviewed by me and considered in my medical decision making (see chart for details).  Patient presents with primarily left lower jaw numbness and left arm numbness, given his cardiac history is quite concerning however his EKG is reassuring. Labs pending. He also reports mild shortness of breath, d-dimer sent given his history of PE     Lab work is reassuring. Discussed the case with Dr. Fletcher Anon patient's cardiologist and given the patient continues to have symptoms of shortness of breath and facial numbness we will admit for observation ____________________________________________   FINAL CLINICAL IMPRESSION(S) / ED DIAGNOSES  Final diagnoses:  Chest pain, unspecified type      NEW MEDICATIONS STARTED DURING THIS VISIT:  New Prescriptions   No medications on file     Note:  This document was prepared using Dragon voice recognition software and may include unintentional dictation errors.    Lavonia Drafts, MD 02/29/16 1110

## 2016-02-29 NOTE — H&P (Signed)
[facility] [place] NAME: Gerald Jefferson    MR#:  [id number]  DATE OF BIRTH:  12-Jul-1955  DATE OF ADMISSION:  02/29/2016  PRIMARY CARE PHYSICIAN: Volanda Napoleon, MD   REQUESTING/REFERRING PHYSICIAN: Dr. Corky Downs  CHIEF COMPLAINT: Left facial numbness started around 2 AM.    Chief Complaint  Patient presents with  . Numbness  . Chest Pain    HISTORY OF PRESENT ILLNESS:  Gerald Jefferson  is a 61 y.o. male with a known history of Left facial numbness, left arm numbness today started at 2 AM. Patient worked up for atypical MI because of his history of CAD and stent placement before. Troponins are negative, EKG showed no ST T changes. Patient says that he never had chest pain, mainly complaint is left facial numbness and left arm numbness, no weakness of extremities, no numbness in the leg. Patient says that he ran out of Klonopin a week ago but primary doctor refused to refill the Klonopin.  No Double vision, no  Facial  droop, no slurred speech.   P AST MEDICAL HISTORY:   Past Medical History:  Diagnosis Date  . Acid reflux   . Anemia 2009   while he was on Warfarin, Aspirin and Plavix. Resolved.   . Anxiety   . Arthritis   . Asthma   . Back pain    lower  . Blood in stool   . Clotting disorder ([place])    DVT-leg and lung  . Coronary artery disease   . Depression   . ED (erectile dysfunction)   . Essential hypertension   . Hyperlipidemia   . Pulmonary embolism ([place])   . RLS (restless legs syndrome)     PAST SURGICAL HISTOIRY:   Past Surgical History:  Procedure Laterality Date  . CARDIAC CATHETERIZATION  07/2007   Patent LAD/OM stents. Stable 40% stenosis in proximal RCA  . CORONARY ANGIOPLASTY WITH STENT PLACEMENT  04/2006   Cypher DES to LAD and OM1  . IVC FILTER PLACEMENT ([facility] HX)    . JOINT REPLACEMENT    . NOSE SURGERY    . PENILE PROSTHESIS IMPLANT N/A 05/20/2015   Procedure: PENILE PROTHESIS INFLATABLE;   Surgeon: Nickie Retort, MD;  Location: [facility] ORS;  Service: Urology;  Laterality: N/A;  . PENILE PROSTHESIS IMPLANT N/A 10/29/2015   Procedure: REVISION PENILE PROTHESIS;  Surgeon: Nickie Retort, MD;  Location: [facility] ORS;  Service: Urology;  Laterality: N/A;  . PERIPHERAL VASCULAR CATHETERIZATION N/A 07/08/2015   unsuccessful, was not removed, only attempted Procedure: IVC Filter Removal;  Surgeon: Algernon Huxley, MD;  Location: [facility];  Service: Cardiovascular;  Laterality: N/A;  . TOTAL HIP ARTHROPLASTY Left     SOCIAL HISTORY:   Social History  Substance Use Topics  . Smoking status: Former Smoker    Packs/day: 1.50    Years: 30.00    Types: Cigarettes    Quit date: 04/08/2006  . Smokeless tobacco: Never Used  . Alcohol use No    FAMILY HISTORY:   Family History  Problem Relation Age of Onset  . Hypertension Mother   . Kidney cancer Neg Hx   . Kidney disease Neg Hx   . Prostate cancer Neg Hx     DRUG ALLERGIES:  No Known Allergies  REVIEW OF SYSTEMS:  CONSTITUTIONAL: No fever, fatigue or weakness.  EYES: No blurred or double vision.  EARS, NOSE, AND THROAT: No tinnitus or ear pain.  RESPIRATORY: No cough, shortness of breath, wheezing or hemoptysis.  CARDIOVASCULAR: No chest pain, orthopnea, edema.  GASTROINTESTINAL: No nausea, vomiting, diarrhea or abdominal pain.  GENITOURINARY: No dysuria, hematuria.  ENDOCRINE: No polyuria, nocturia,  HEMATOLOGY: No anemia, easy bruising or bleeding SKIN: No rash or lesion. MUSCULOSKELETAL: No joint pain or arthritis.   NEUROLOGIC: Numbness of the left side of the face, left arm.  PSYCHIATRY: No anxiety or depression.   MEDICATIONS AT HOME:   Prior to Admission medications   Medication Sig Start Date End Date Taking? Authorizing Provider  amLODipine (NORVASC) 10 MG tablet TAKE 1 TABLET BY MOUTH EVERY DAY IN THE MORNING 02/24/15  Yes Historical Provider, MD  aspirin EC 81 MG tablet Take 81 mg by mouth daily.    Yes Historical Provider, MD  Azelastine HCl 0.15 % SOLN 1 SPRAY/NARES DAILY 03/18/15  Yes Historical Provider, MD  budesonide-formoterol (SYMBICORT) 80-4.5 MCG/ACT inhaler Inhale 2 puffs into the lungs 2 (two) times daily.   Yes Historical Provider, MD  busPIRone (BUSPAR) 7.5 MG tablet Take 15 mg by mouth 2 (two) times daily.  04/13/15  Yes Historical Provider, MD  clonazePAM (KLONOPIN) 1 MG tablet Take 1 mg by mouth every morning. 04/15/15  Yes Historical Provider, MD  fexofenadine (ALLEGRA) 180 MG tablet Take 180 mg by mouth daily.   Yes Historical Provider, MD  gabapentin (NEURONTIN) 600 MG tablet Take 600 mg by mouth 3 (three) times daily.   Yes Historical Provider, MD  HYDROcodone-acetaminophen (NORCO) 10-325 MG tablet Take 1 tablet by mouth every 6 (six) hours as needed. Takes qid daily   Yes Historical Provider, MD  promethazine (PHENERGAN) 25 MG tablet Take 25 mg by mouth every 6 (six) hours as needed for nausea or vomiting.   Yes Historical Provider, MD  ramipril (ALTACE) 10 MG capsule TAKE 1 CAPSULE (10 MG TOTAL) BY MOUTH DAILY. 02/22/16  Yes Wellington Hampshire, MD  rOPINIRole (REQUIP) 0.25 MG tablet Take 0.25 mg by mouth at bedtime.   Yes Historical Provider, MD  tiZANidine (ZANAFLEX) 2 MG tablet Take 2 mg by mouth at bedtime.     Yes Historical Provider, MD  cloNIDine (CATAPRES) 0.2 MG tablet Take 0.2 mg by mouth 2 (two) times daily.    Historical Provider, MD  dronabinol (MARINOL) 2.5 MG capsule Take 2.5 mg by mouth 2 (two) times daily before a meal.    Historical Provider, MD  ferrous sulfate 325 (65 FE) MG tablet Take 325 mg by mouth daily.     Historical Provider, MD  omeprazole (PRILOSEC) 20 MG capsule Take 1 capsule (20 mg total) by mouth daily. 08/29/11   Wellington Hampshire, MD  Potassium Gluconate ([employer] POTASSIUM GLUCONATE) 2 MEQ TABS Take by mouth.    Historical Provider, MD  rosuvastatin (CRESTOR) 20 MG tablet Take 20 mg by mouth at bedtime.     Historical Provider, MD  vitamin B-12 1000  MCG tablet Take 1 tablet (1,000 mcg total) by mouth daily. 10/30/14   [name], MD      VITAL SIGNS:  Blood pressure 135/80, pulse 64, temperature 98.9 F (37.2 C), temperature source Oral, resp. rate 20, height 6' (1.829 m), weight 64.9 kg (143 lb), SpO2 99 %.  PHYSICAL EXAMINATION:  GENERAL:  61 y.o.-year-old patient lying in the bed with no acute distress.  EYES: Pupils equal, round, reactive to light and accommodation. No scleral icterus. Extraocular muscles intact.  HEENT: Head atraumatic, normocephalic. Oropharynx and nasopharynx clear.  NECK:  Supple, no  jugular venous distention. No thyroid enlargement, no tenderness.  LUNGS: Normal breath sounds bilaterally, no wheezing, rales,rhonchi or crepitation. No use of accessory muscles of respiration.  CARDIOVASCULAR: S1, S2 normal. No murmurs, rubs, or gallops.  ABDOMEN: Soft, nontender, nondistended. Bowel sounds present. No organomegaly or mass.  EXTREMITIES: No pedal edema, cyanosis, or clubbing.  NEUROLOGIC: Cranial nerves II through XII are intact. Muscle strength 5/5 in all extremities. Sensation intact. Gait not checked.  PSYCHIATRIC: The patient is alert and oriented x 3.  SKIN: No obvious rash, lesion, or ulcer.   LABORATORY PANEL:   CBC  Recent Labs Lab 02/29/16 0811  WBC 6.6  HGB 14.3  HCT 41.7  PLT 208   ------------------------------------------------------------------------------------------------------------------  Chemistries   Recent Labs Lab 02/29/16 0811  NA 141  K 3.7  CL 108  CO2 25  GLUCOSE 95  BUN 26*  CREATININE 1.02  CALCIUM 9.4  AST 17  ALT 14*  ALKPHOS 76  BILITOT 0.5   ------------------------------------------------------------------------------------------------------------------  Cardiac Enzymes  Recent Labs Lab 02/29/16 0811  TROPONINI <0.03    ------------------------------------------------------------------------------------------------------------------  RADIOLOGY:  Dg Chest 2 View  Result Date: 02/29/2016 CLINICAL DATA:  Onset of chest pain, shortness of breath, left facial numbness radiating into the left arm. History of previous pulmonary emboli and coronary stent placement. EXAM: CHEST  2 VIEW COMPARISON:  PA and lateral chest x-ray of October 15, 2012 FINDINGS: The lungs are well-expanded and clear. The heart and pulmonary vascularity are normal. The mediastinum is normal in width. The trachea is midline. The bony thorax exhibits no acute abnormality. IMPRESSION: COPD. No pneumonia, CHF, nor other acute cardiopulmonary abnormality. Electronically Signed   By: David  Martinique M.D.   On: 02/29/2016 08:32    EKG:   Orders placed or performed during the hospital encounter of 02/29/16  . ED EKG within 10 minutes  . ED EKG within 10 minutes  . EKG 12-Lead  . EKG 12-Lead  Sinus bradycardia 59 bpm no ST T changes.   IMPRESSION AND PLAN:   61 year old male patient with history of essential hypertension, coronary artery disease status post PCI in 2008 March, chronic anxiety, chronic pain, history of DVT, PE before comes because of left-sided facial numbness.  #1 left facial numbness evaluated for possible TIA versus CVA: Still has numbness get a CT of the head stat, neuro checks, continue aspirin, statins, ultrasound of carotids ordered patient also will get echocardiogram. Check B12 levels. Monitor on tele. #2 history of coronary artery disease with PCI 2 stents placed in 2008 March by Dr.Arida., spoke with PA  Thurmond Butts he recommended a stress test tomorrow, cycle troponins 2 more times, continue aspirin, nitrates, statins, hold  beta blocker because of bradycardia.  Next #3 chronic pain syndrome: Patient is on Percocet, followed up by pain management.  All the records are reviewed and case discussed with ED provider. Management  plans discussed with the patient, family and they are in agreement.  CODE STATUS: full  TOTAL TIME TAKING CARE OF THIS PATIENT: 35  minutes.    Epifanio Lesches M.D on 02/29/2016 at 10:47 AM  Between 7am to 6pm - Pager - [phone]  After 6pm go to [email or web address] - password [email or web address] [facility]  Office  [phone]  CC: Primary care physician; Volanda Napoleon, MD  Note: This dictation was prepared with Dragon dictation along with smaller phrase technology. Any transcriptional errors that result from this process are unintentional.

## 2016-02-29 NOTE — ED Triage Notes (Addendum)
Pt presents with left side facial numbness radiating down into left arm started at app 3am. Pt also reports chest pain and shortness of breath. Pt with hx of PE's and has had stents placed.

## 2016-02-29 NOTE — Progress Notes (Signed)
Patient admitted via the ED. States the numbness of his left arm and left side of face has decreased since this morning.  Assessment without significant findings.

## 2016-02-29 NOTE — Progress Notes (Signed)
Anticoagulation monitoring(Lovenox):  60yo  male ordered Lovenox 30 mg Q24h  Filed Weights   02/29/16 0748  Weight: 143 lb (64.9 kg)   BMI 19.4   Lab Results  Component Value Date   CREATININE 1.02 02/29/2016   CREATININE 1.01 10/28/2015   CREATININE 1.01 05/21/2015   Estimated Creatinine Clearance: 68.9 mL/min (by C-G formula based on SCr of 1.02 mg/dL). Hemoglobin & Hematocrit     Component Value Date/Time   HGB 14.3 02/29/2016 0811   HGB 15.5 10/15/2012 1008   HCT 41.7 02/29/2016 0811   HCT 44.7 10/15/2012 1008     Per Protocol for Patient with estCrcl > 30 ml/min and BMI < 40, will transition to Lovenox 40 mg Q24H.

## 2016-02-29 NOTE — Consult Note (Addendum)
Cardiology Consultation Note  Patient ID: Gerald Jefferson, MRN: [id number], DOB/AGE: 63-Jan-1957 63 y.o. Admit date: 02/29/2016   Date of Consult: 02/29/2016 Primary Physician: Volanda Napoleon, MD Primary Cardiologist: Dr. Fletcher Anon, MD Requesting Physician: Dr. Vianne Bulls, MD  Chief Complaint: Left-sided paresthesias  Reason for Consult: Chest pressure  HPI: 61 y.o. male with h/o CAD s/p remote PCI/DES to the LAD and OM in 2008, PE later in [id number] complicated by GI bleed from unknown source while on triple therapy s/p IV filter in 2008 with failed retrieval in 06/2015 by vascular, ED s/p prosthesis with revision most recently in 10/2015, chronic back pain, and anxiety who presented to [facility] on 1/22 with onset of left-sided paresthesias of the face and arm this morning.   Cardiac cath in 2009 showed patent stents and mild disease in the RCA. Most recent echo and nuclear stress test in 2011 were unremarkable. He has been doing well from a cardiac standpoint, most recently being seen in 04/2015.   On the night of 1/21 he was in his usual state of health without complaints or any symptoms upon going to bed. He woke up at 6:30 AM and noticed the left side of his face and left arm were numb. States it felt like "novacaine." Never with chest pain, though did note some indigestion-like sensation along the left side of his chest this morning without radiation that has since resolved. His left-sided numbness persists at this time.   Upon the patient's arrival to [facility] they were found to have negative troponin x 1, unremarkable CBC, d-dimer negative, SCr 1.02, K+ 3.7. BP [id number] mmHg systolic with a heart rate in the low 60's bpm. CT head was no performed in the ED for uncertain reasons. ECG as below, CXR showed no acute process.   Past Medical History:  Diagnosis Date  . Acid reflux   . Anemia 2009   while he was on Warfarin, Aspirin and Plavix. Resolved.   . Anxiety   . Arthritis   . Asthma   . Back pain    lower  . Blood in stool   . Clotting disorder ([place])    DVT-leg and lung  . Coronary artery disease   . Depression   . ED (erectile dysfunction)   . Essential hypertension   . Hyperlipidemia   . Pulmonary embolism ([place])   . RLS (restless legs syndrome)       Most Recent Cardiac Studies: Echo 11/2009: Normal LVSF, normal WM, mild LVH with mild diastolic dysfunction, trace MR,  trace pulmonary regurgitation, trace TR, normal PASP.   Nuclear stress test 2011: Normal study with normal EF.   Surgical History:  Past Surgical History:  Procedure Laterality Date  . CARDIAC CATHETERIZATION  07/2007   Patent LAD/OM stents. Stable 40% stenosis in proximal RCA  . CORONARY ANGIOPLASTY WITH STENT PLACEMENT  04/2006   Cypher DES to LAD and OM1  . IVC FILTER PLACEMENT ([facility] HX)    . JOINT REPLACEMENT    . NOSE SURGERY    . PENILE PROSTHESIS IMPLANT N/A 05/20/2015   Procedure: PENILE PROTHESIS INFLATABLE;  Surgeon: Nickie Retort, MD;  Location: [facility] ORS;  Service: Urology;  Laterality: N/A;  . PENILE PROSTHESIS IMPLANT N/A 10/29/2015   Procedure: REVISION PENILE PROTHESIS;  Surgeon: Nickie Retort, MD;  Location: [facility] ORS;  Service: Urology;  Laterality: N/A;  . PERIPHERAL VASCULAR CATHETERIZATION N/A 07/08/2015   unsuccessful, was not removed, only attempted Procedure: IVC Filter Removal;  Surgeon: Corene Cornea  Bunnie Domino, MD;  Location: [facility];  Service: Cardiovascular;  Laterality: N/A;  . TOTAL HIP ARTHROPLASTY Left      Home Meds: Prior to Admission medications   Medication Sig Start Date End Date Taking? Authorizing Provider  amLODipine (NORVASC) 10 MG tablet TAKE 1 TABLET BY MOUTH EVERY DAY IN THE MORNING 02/24/15  Yes Historical Provider, MD  aspirin EC 81 MG tablet Take 81 mg by mouth daily.   Yes Historical Provider, MD  Azelastine HCl 0.15 % SOLN 1 SPRAY/NARES DAILY 03/18/15  Yes Historical Provider, MD  budesonide-formoterol (SYMBICORT) 80-4.5 MCG/ACT inhaler Inhale 2  puffs into the lungs 2 (two) times daily.   Yes Historical Provider, MD  busPIRone (BUSPAR) 7.5 MG tablet Take 15 mg by mouth 2 (two) times daily.  04/13/15  Yes Historical Provider, MD  clonazePAM (KLONOPIN) 1 MG tablet Take 1 mg by mouth every morning. 04/15/15  Yes Historical Provider, MD  fexofenadine (ALLEGRA) 180 MG tablet Take 180 mg by mouth daily.   Yes Historical Provider, MD  gabapentin (NEURONTIN) 600 MG tablet Take 600 mg by mouth 3 (three) times daily.   Yes Historical Provider, MD  HYDROcodone-acetaminophen (NORCO) 10-325 MG tablet Take 1 tablet by mouth every 6 (six) hours as needed. Takes qid daily   Yes Historical Provider, MD  promethazine (PHENERGAN) 25 MG tablet Take 25 mg by mouth every 6 (six) hours as needed for nausea or vomiting.   Yes Historical Provider, MD  ramipril (ALTACE) 10 MG capsule TAKE 1 CAPSULE (10 MG TOTAL) BY MOUTH DAILY. 02/22/16  Yes Wellington Hampshire, MD  rOPINIRole (REQUIP) 0.25 MG tablet Take 0.25 mg by mouth at bedtime.   Yes Historical Provider, MD  tiZANidine (ZANAFLEX) 2 MG tablet Take 2 mg by mouth at bedtime.     Yes Historical Provider, MD  cloNIDine (CATAPRES) 0.2 MG tablet Take 0.2 mg by mouth 2 (two) times daily.    Historical Provider, MD  dronabinol (MARINOL) 2.5 MG capsule Take 2.5 mg by mouth 2 (two) times daily before a meal.    Historical Provider, MD  ferrous sulfate 325 (65 FE) MG tablet Take 325 mg by mouth daily.     Historical Provider, MD  omeprazole (PRILOSEC) 20 MG capsule Take 1 capsule (20 mg total) by mouth daily. 08/29/11   Wellington Hampshire, MD  Potassium Gluconate ([employer] POTASSIUM GLUCONATE) 2 MEQ TABS Take by mouth.    Historical Provider, MD  rosuvastatin (CRESTOR) 20 MG tablet Take 20 mg by mouth at bedtime.     Historical Provider, MD  vitamin B-12 1000 MCG tablet Take 1 tablet (1,000 mcg total) by mouth daily. 10/30/14   [name], MD    Inpatient Medications:  . aspirin EC  81 mg Oral Daily  . cloNIDine  0.2 mg Oral  BID  . dronabinol  2.5 mg Oral BID AC  . enoxaparin (LOVENOX) injection  30 mg Subcutaneous Q24H  . ferrous sulfate  325 mg Oral Daily  . mometasone-formoterol  2 puff Inhalation BID  . nitroGLYCERIN  0.5 inch Topical Q6H  . pantoprazole  40 mg Oral Daily  . ramipril  10 mg Oral Daily  . rOPINIRole  0.25 mg Oral QHS  . rosuvastatin  20 mg Oral QHS  . cyanocobalamin  1,000 mcg Oral Daily     Allergies: No Known Allergies  Social History   Social History  . Marital status: Married    Spouse name: N/A  . Number  of children: 5  . Years of education: N/A   Occupational History  . Building Maintenance    Social History Main Topics  . Smoking status: Former Smoker    Packs/day: 1.50    Years: 30.00    Types: Cigarettes    Quit date: 04/08/2006  . Smokeless tobacco: Never Used  . Alcohol use No  . Drug use: No  . Sexual activity: Not on file   Other Topics Concern  . Not on file   Social History Narrative  . No narrative on file     Family History  Problem Relation Age of Onset  . Hypertension Mother   . Kidney cancer Neg Hx   . Kidney disease Neg Hx   . Prostate cancer Neg Hx      Review of Systems: Review of Systems  Constitutional: Positive for malaise/fatigue. Negative for chills, diaphoresis, fever and weight loss.  HENT: Negative for congestion.   Eyes: Negative for discharge and redness.  Respiratory: Negative for cough, hemoptysis, sputum production, shortness of breath and wheezing.   Cardiovascular: Negative for chest pain, palpitations, orthopnea, claudication, leg swelling and PND.  Gastrointestinal: Negative for abdominal pain, blood in stool, heartburn, melena, nausea and vomiting.  Genitourinary: Negative for hematuria.  Musculoskeletal: Negative for falls and myalgias.  Skin: Negative for rash.  Neurological: Positive for tingling, sensory change and weakness. Negative for dizziness, tremors, speech change, focal weakness, seizures, loss of  consciousness and headaches.  Endo/Heme/Allergies: Does not bruise/bleed easily.  Psychiatric/Behavioral: Negative for substance abuse. The patient is not nervous/anxious.   All other systems reviewed and are negative.   Labs:  Recent Labs  02/29/16 0811  TROPONINI <0.03   Lab Results  Component Value Date   WBC 6.6 02/29/2016   HGB 14.3 02/29/2016   HCT 41.7 02/29/2016   MCV 88.9 02/29/2016   PLT 208 02/29/2016     Recent Labs Lab 02/29/16 0811  NA 141  K 3.7  CL 108  CO2 25  BUN 26*  CREATININE 1.02  CALCIUM 9.4  PROT 7.6  BILITOT 0.5  ALKPHOS 76  ALT 14*  AST 17  GLUCOSE 95   No results found for: CHOL, HDL, LDLCALC, TRIG No results found for: DDIMER  Radiology/Studies:  Dg Chest 2 View  Result Date: 02/29/2016 CLINICAL DATA:  Onset of chest pain, shortness of breath, left facial numbness radiating into the left arm. History of previous pulmonary emboli and coronary stent placement. EXAM: CHEST  2 VIEW COMPARISON:  PA and lateral chest x-ray of October 15, 2012 FINDINGS: The lungs are well-expanded and clear. The heart and pulmonary vascularity are normal. The mediastinum is normal in width. The trachea is midline. The bony thorax exhibits no acute abnormality. IMPRESSION: COPD. No pneumonia, CHF, nor other acute cardiopulmonary abnormality. Electronically Signed   By: David  Martinique M.D.   On: 02/29/2016 08:32    EKG: Interpreted by me showed: sinus bradycardia, 59 bpm, LVH, no acute st/t changes Telemetry: Interpreted by me showed: NSR, 60 bpm  Weights: [employer]   02/29/16 0748  Weight: 143 lb (64.9 kg)     Physical Exam: Blood pressure 135/80, pulse 64, temperature 98.9 F (37.2 C), temperature source Oral, resp. rate 20, height 6' (1.829 m), weight 143 lb (64.9 kg), SpO2 99 %. Body mass index is 19.39 kg/m. General: Well developed, well nourished, in no acute distress. Head: Normocephalic, atraumatic, sclera non-icteric, no xanthomas, nares  are without discharge.  Neck: Negative for carotid bruits.  JVD not elevated. Lungs: Clear bilaterally to auscultation without wheezes, rales, or rhonchi. Breathing is unlabored. Heart: RRR with S1 S2. No murmurs, rubs, or gallops appreciated. Abdomen: Soft, non-tender, non-distended with normoactive bowel sounds. No hepatomegaly. No rebound/guarding. No obvious abdominal masses. Msk:  Strength and tone appear normal for age. Extremities: No clubbing or cyanosis. No edema. Distal pedal pulses are 2+ and equal bilaterally. Neuro: Alert and oriented X 3. No facial asymmetry. Moves all extremities spontaneously. Decreased tactile sensation left face and left upper extremity. 5/5 strength.  Psych:  Responds to questions appropriately with a normal affect.    Assessment and Plan:  Principal Problem:   Left sided numbness Active Problems:   Coronary artery disease   Hyperlipidemia   Essential hypertension   Chest pressure   History of pulmonary embolism   S/P IVC filter    1. Left-sided paraesthesias: -Woke up at 6:30 AM today with left sided face and upper extremity numbness -Last known well time the night prior when going to bed -Discussed with IM, stat CT head ordered -Check carotid doppler and echo -ASA, Crestor  2. Chest pressure/indigestion/CAD as above: -Does not describe chest pain -Notes a "indigestion" feeling along the left side of the chest that does not radiate -Troponin negative x 1 thus far, continue to cycle -If CT head and MRI brain rule out stroke will plan for Lexiscan Myoview on 1/23 (cannto treadmill 2/2 hip pain) -ASA as above -Not on beta blocker at this time 2/2 bradycardia -Nitro -Check fasting lipid panel and A1C  3. HTN: -Controlled -Continue current medications  4. HLD: -Crestor  5. History of PE s/p IVC filter in 2008: -Failed retrieval in 06/2015      Signed, Christell Faith, PA-C [place] Pager: [phone] 02/29/2016, 11:00  AM     Attending Note Patient seen and examined, agree with detailed note above,  Patient presentation and plan discussed on rounds.  Cardiology consult placed by Dr.  Sissy Hoff for chest pressure  EKG lab work, chest x-ray reviewed independently by myself Echocardiogram pending  Patient with prior history of coronary artery disease, stent placement followed by Dr. Fletcher Anon, history of DVT and PE, History of GI bleed when he was on triple therapy, IVC filter placed at that time Patient reports developing left facial, arm paresthesia this morning This evening symptoms have improved though not at his baseline  Cardiology consult in for chest pressure Patient reports having some occasional chest pressure, describes it as a occasional burning He is uncertain if this is heartburn or coronary disease with angina  Also reports having lightheadedness when he stands up, or when he bends over and then stands up Rapid weight loss over the past year down at least 20 pounds, etiology unclear, just not eating well Reports he is single, does not have anyone to make him food  He does report having some tightness in his neck, feels it is musculoskeletal  On physical exam he is thin, no apparent distress, no JVD, lungs clear to auscultation bilaterally, heart sounds regular with no murmurs appreciated, abdomen thin soft nontender, no significant lower external edema  Lab work reviewed showing normal BMP, negative cardiac enzymes, normal CBC  EKG reviewed showing normal sinus rhythm with no significant ST or T-wave changes Echocardiogram pending CT scan reviewed by myself showing no acute stroke Carotid ultrasound reviewed by myself showing 50% carotid disease  --- Left face left arm paresthesia Symptoms concerning for TIA Symptoms dramatically improved since onset He does  have significant bleeding risk, he is nervous about taking aspirin with Plavix He does take low-dose aspirin at home but does  have some burning in his chest Consider keeping him on Plavix 75 mg daily with PPI such as Protonix Consider event monitor to rule out arrhythmia such as atrial fibrillation, this can be arranged by our office. Discussed with the patient Echocardiogram pending  --- Chest pressure Atypical, described as a burning Unable to treadmill given her arthritic pain Schedule for stress Myoview tomorrow, pharmacologic Cardiac enzymes negative, no significant EKG changes We will discontinue Nitropaste given migraine headache, discussed with nursing  --- Hypertension Significant orthostasis symptoms Amlodipine held this admission Will also decrease clonidine down to 0.1 mg twice a day Likely needing less medication in the setting of rapid weight loss in the past year, more than 20 pounds  ----Weight loss Unclear if this is failure to thrive, unable to make his own food. May need social work her visit to his house If no food may need Meals on Wheels Consider financial assessment to make sure he is able to buy food  Discussed with patient and family at the bedside Greater than 50% was spent in counseling and coordination of care with patient Total encounter time 110 minutes or more   Signed: Esmond Plants  M.D., Ph.D. [facility]

## 2016-03-01 ENCOUNTER — Telehealth: Payer: Self-pay | Admitting: *Deleted

## 2016-03-01 ENCOUNTER — Observation Stay (HOSPITAL_BASED_OUTPATIENT_CLINIC_OR_DEPARTMENT_OTHER): Payer: BLUE CROSS/BLUE SHIELD

## 2016-03-01 DIAGNOSIS — R079 Chest pain, unspecified: Secondary | ICD-10-CM

## 2016-03-01 LAB — ECHOCARDIOGRAM COMPLETE
HEIGHTINCHES: 72 in
Weight: 2288 oz

## 2016-03-01 LAB — HEMOGLOBIN A1C
HEMOGLOBIN A1C: 5.2 % (ref 4.8–5.6)
MEAN PLASMA GLUCOSE: 103 mg/dL

## 2016-03-01 LAB — NM MYOCAR MULTI W/SPECT W/WALL MOTION / EF
CHL CUP RESTING HR STRESS: 57 {beats}/min
LV dias vol: 87 mL (ref 62–150)
LV sys vol: 43 mL
NUC STRESS TID: 0.99
Peak HR: 83 {beats}/min
Percent HR: 52 %

## 2016-03-01 LAB — LIPID PANEL
CHOL/HDL RATIO: 1.9 ratio
CHOLESTEROL: 128 mg/dL (ref 0–200)
HDL: 69 mg/dL (ref >40)
LDL Cholesterol: 46 mg/dL (ref 0–99)
TRIGLYCERIDES: 67 mg/dL (ref <150)
VLDL: 13 mg/dL (ref 0–40)

## 2016-03-01 MED ORDER — TECHNETIUM TC 99M TETROFOSMIN IV KIT
12.2600 | PACK | Freq: Once | INTRAVENOUS | Status: AC | PRN
  Administered 2016-03-01: 12.26 via INTRAVENOUS

## 2016-03-01 MED ORDER — REGADENOSON 0.4 MG/5ML IV SOLN
0.4000 mg | Freq: Once | INTRAVENOUS | Status: AC
  Administered 2016-03-01: 0.4 mg via INTRAVENOUS

## 2016-03-01 MED ORDER — TECHNETIUM TC 99M TETROFOSMIN IV KIT
29.9300 | PACK | Freq: Once | INTRAVENOUS | Status: AC | PRN
  Administered 2016-03-01: 29.93 via INTRAVENOUS

## 2016-03-01 NOTE — Telephone Encounter (Signed)
No answer. Left message to call back.   

## 2016-03-01 NOTE — Progress Notes (Signed)
Stress test results back. Per Christell Faith, PA patient can follow-up as outpatient. Dr. Posey Pronto notified.

## 2016-03-01 NOTE — Progress Notes (Signed)
Patient given discharge teaching and paperwork regarding medications, diet, follow-up appointments and activity. Patient understanding verbalized. No complaints at this time. IV and telemetry discontinued prior to leaving. Skin assessment as previously charted and vitals are stable; on room air. Patient being discharged to home. No new prescriptions.

## 2016-03-01 NOTE — Progress Notes (Signed)
    He is for [employer] this morning. Paresthesias are improved.

## 2016-03-01 NOTE — Discharge Summary (Signed)
[facility] [place] NAME: Randalph Dufrene    MR#:  [id number]  DATE OF BIRTH:  [birth year]  DATE OF ADMISSION:  02/29/2016 ADMITTING PHYSICIAN: Epifanio Lesches, MD  DATE OF DISCHARGE: 03/01/16  PRIMARY CARE PHYSICIAN: Volanda Napoleon, MD    ADMISSION DIAGNOSIS:  TIA (transient ischemic attack) [G45.9] Numbness and tingling of left side of face [R20.0, R20.2] Chest pain, unspecified type [R07.9]  DISCHARGE DIAGNOSIS:  Left hand numbness CAD HTN  SECONDARY DIAGNOSIS:   Past Medical History:  Diagnosis Date  . Acid reflux   . Anemia 2009   while he was on Warfarin, Aspirin and Plavix. Resolved.   . Anxiety   . Arthritis   . Asthma   . Back pain    lower  . Blood in stool   . Clotting disorder ([place])    DVT-leg and lung  . Coronary artery disease   . Depression   . ED (erectile dysfunction)   . Essential hypertension   . Hyperlipidemia   . Pulmonary embolism ([place])   . RLS (restless legs syndrome)     HOSPITAL COURSE:  61 y.o. male with h/o CAD s/p remote PCI/DES to the LAD and OM in 2008, PE later in [id number] complicated by GI bleed from unknown source while on triple therapy s/p IV filter in 2008 with failed retrieval in 06/2015 by vascular, ED s/p prosthesis with revision most recently in 10/2015, chronic back pain, and anxiety who presented to [facility] on 1/22 with onset of left-sided paresthesias of the face and arm this morning.  1. Left-sided paraesthesias: -pt came in  with left sided face and upper extremity numbness -CT head negative for cva -carotid doppler mild atherosclerosis  -ASA, Crestor  2. Chest pressure/indigestion/CAD as above: -Does not describe chest pain -Notes a "indigestion" feeling along the left side of the chest that does not radiate -Troponin negative x 3 thus far --ASA as above -Not on beta blocker at this time 2/2 bradycardia -myoview shows a small in size, mild in severity,  reversible defect involving the mid and apical anterior segments seen only on the attenuation corrected images suggestive of artifact and less likely ischemia. -per cardiology since pt feeling ok to d/c and out pt f/u -pt aware of results  3. HTN: -Controlled -Continue current medications  4. HLD: -Crestor  5. History of PE s/p IVC filter in 2008: -Failed retrieval in 06/2015   D/c home CONSULTS OBTAINED:  Treatment Team:  Wellington Hampshire, MD  DRUG ALLERGIES:  No Known Allergies  DISCHARGE MEDICATIONS:   Current Discharge Medication List    CONTINUE these medications which have NOT CHANGED   Details  amLODipine (NORVASC) 10 MG tablet TAKE 1 TABLET BY MOUTH EVERY DAY IN THE MORNING Refills: 0    aspirin EC 81 MG tablet Take 81 mg by mouth daily.    Azelastine HCl 0.15 % SOLN 1 SPRAY/NARES DAILY Refills: 0    budesonide-formoterol (SYMBICORT) 80-4.5 MCG/ACT inhaler Inhale 2 puffs into the lungs 2 (two) times daily.    busPIRone (BUSPAR) 7.5 MG tablet Take 15 mg by mouth 2 (two) times daily.  Refills: 3    clonazePAM (KLONOPIN) 1 MG tablet Take 1 mg by mouth every morning. Refills: 0    fexofenadine (ALLEGRA) 180 MG tablet Take 180 mg by mouth daily.    gabapentin (NEURONTIN) 600 MG tablet Take 600 mg by mouth 3 (three) times daily.    HYDROcodone-acetaminophen (NORCO) 10-325  MG tablet Take 1 tablet by mouth every 6 (six) hours as needed. Takes qid daily    promethazine (PHENERGAN) 25 MG tablet Take 25 mg by mouth every 6 (six) hours as needed for nausea or vomiting.    ramipril (ALTACE) 10 MG capsule TAKE 1 CAPSULE (10 MG TOTAL) BY MOUTH DAILY. Qty: 30 capsule, Refills: 3    rOPINIRole (REQUIP) 0.25 MG tablet Take 0.25 mg by mouth at bedtime.    tiZANidine (ZANAFLEX) 2 MG tablet Take 2 mg by mouth at bedtime.      cloNIDine (CATAPRES) 0.2 MG tablet Take 0.2 mg by mouth 2 (two) times daily.    dronabinol (MARINOL) 2.5 MG capsule Take 2.5 mg by mouth 2  (two) times daily before a meal.    ferrous sulfate 325 (65 FE) MG tablet Take 325 mg by mouth daily.     omeprazole (PRILOSEC) 20 MG capsule Take 1 capsule (20 mg total) by mouth daily. Qty: 30 capsule, Refills: 6    Potassium Gluconate (CVS POTASSIUM GLUCONATE) 2 MEQ TABS Take by mouth.    rosuvastatin (CRESTOR) 20 MG tablet Take 20 mg by mouth at bedtime.     vitamin B-12 1000 MCG tablet Take 1 tablet (1,000 mcg total) by mouth daily. Qty: 30 tablet, Refills: 1        If you experience worsening of your admission symptoms, develop shortness of breath, life threatening emergency, suicidal or homicidal thoughts you must seek medical attention immediately by calling 911 or calling your MD immediately  if symptoms less severe.  You Must read complete instructions/literature along with all the possible adverse reactions/side effects for all the Medicines you take and that have been prescribed to you. Take any new Medicines after you have completely understood and accept all the possible adverse reactions/side effects.   Please note  You were cared for by a hospitalist during your hospital stay. If you have any questions about your discharge medications or the care you received while you were in the hospital after you are discharged, you can call the unit and asked to speak with the hospitalist on call if the hospitalist that took care of you is not available. Once you are discharged, your primary care physician will handle any further medical issues. Please note that NO REFILLS for any discharge medications will be authorized once you are discharged, as it is imperative that you return to your primary care physician (or establish a relationship with a primary care physician if you do not have one) for your aftercare needs so that they can reassess your need for medications and monitor your lab values. Today   SUBJECTIVE   Left hand numbness and chest discomfort improved  VITAL SIGNS:   Blood pressure 114/69, pulse 60, temperature 97.5 F (36.4 C), temperature source Oral, resp. rate 16, height 6' (1.829 m), weight 64.9 kg (143 lb), SpO2 99 %.  I/O:   Intake/Output Summary (Last 24 hours) at 03/01/16 1426 Last data filed at 03/01/16 1409  Gross per 24 hour  Intake              480 ml  Output              450 ml  Net               30 ml    PHYSICAL EXAMINATION:  GENERAL:  61 y.o.-year-old patient lying in the bed with no acute distress.  EYES: Pupils equal, round, reactive to light and  accommodation. No scleral icterus. Extraocular muscles intact.  HEENT: Head atraumatic, normocephalic. Oropharynx and nasopharynx clear.  NECK:  Supple, no jugular venous distention. No thyroid enlargement, no tenderness.  LUNGS: Normal breath sounds bilaterally, no wheezing, rales,rhonchi or crepitation. No use of accessory muscles of respiration.  CARDIOVASCULAR: S1, S2 normal. No murmurs, rubs, or gallops.  ABDOMEN: Soft, non-tender, non-distended. Bowel sounds present. No organomegaly or mass.  EXTREMITIES: No pedal edema, cyanosis, or clubbing.  NEUROLOGIC: Cranial nerves II through XII are intact. Muscle strength 5/5 in all extremities. Sensation intact. Gait not checked.  PSYCHIATRIC: The patient is alert and oriented x 3.  SKIN: No obvious rash, lesion, or ulcer.   DATA REVIEW:   CBC   Recent Labs Lab 02/29/16 0811  WBC 6.6  HGB 14.3  HCT 41.7  PLT 208    Chemistries   Recent Labs Lab 02/29/16 0811  NA 141  K 3.7  CL 108  CO2 25  GLUCOSE 95  BUN 26*  CREATININE 1.02  CALCIUM 9.4  AST 17  ALT 14*  ALKPHOS 76  BILITOT 0.5    Microbiology Results   No results found for this or any previous visit (from the past 240 hour(s)).  RADIOLOGY:  Dg Chest 2 View  Result Date: 02/29/2016 CLINICAL DATA:  Onset of chest pain, shortness of breath, left facial numbness radiating into the left arm. History of previous pulmonary emboli and coronary stent  placement. EXAM: CHEST  2 VIEW COMPARISON:  PA and lateral chest x-ray of October 15, 2012 FINDINGS: The lungs are well-expanded and clear. The heart and pulmonary vascularity are normal. The mediastinum is normal in width. The trachea is midline. The bony thorax exhibits no acute abnormality. IMPRESSION: COPD. No pneumonia, CHF, nor other acute cardiopulmonary abnormality. Electronically Signed   By: David  Martinique M.D.   On: 02/29/2016 08:32   Ct Head Wo Contrast  Result Date: 02/29/2016 CLINICAL DATA:  Left face and upper extremity weakness for 10 hours. EXAM: CT HEAD WITHOUT CONTRAST TECHNIQUE: Contiguous axial images were obtained from the base of the skull through the vertex without intravenous contrast. COMPARISON:  Brain MRI 08/27/2013. FINDINGS: Brain: Appears normal without hemorrhage, infarct, mass lesion, mass effect, midline shift or abnormal extra-axial fluid collection. No hydrocephalus or pneumocephalus. Vascular: Atherosclerotic vascular disease is noted. Skull: Intact. Sinuses/Orbits: Negative. Other: None. IMPRESSION: No acute abnormality. Atherosclerosis. Electronically Signed   By: Inge Rise M.D.   On: 02/29/2016 11:57   US Carotid Bilateral  Result Date: 02/29/2016 CLINICAL DATA:  TIA.  Hypertension, coronary disease, syncope. EXAM: BILATERAL CAROTID DUPLEX ULTRASOUND TECHNIQUE: Pearline Cables scale imaging, color Doppler and duplex ultrasound was performed of bilateral carotid and vertebral arteries in the neck. COMPARISON:  None. TECHNIQUE: Quantification of carotid stenosis is based on velocity parameters that correlate the residual internal carotid diameter with NASCET-based stenosis levels, using the diameter of the distal internal carotid lumen as the denominator for stenosis measurement. The following velocity measurements were obtained: PEAK SYSTOLIC/END DIASTOLIC RIGHT ICA:                     87/27cm/sec CCA:                     [id number] SYSTOLIC ICA/CCA RATIO:  1.4 DIASTOLIC  ICA/CCA RATIO: 2.0 ECA:                     97cm/sec LEFT ICA:  76/29cm/sec CCA:                     [id number] SYSTOLIC ICA/CCA RATIO:  1.1 DIASTOLIC ICA/CCA RATIO: 1.4 ECA:                     100cm/sec FINDINGS: RIGHT CAROTID ARTERY: Eccentric plaque in the bulb and proximal ICA, partially calcified. No high-grade stenosis. Normal waveforms and color Doppler signal. RIGHT VERTEBRAL ARTERY:  Normal flow direction and waveform. LEFT CAROTID ARTERY: Partially calcified eccentric plaque at the carotid bifurcation. No high-grade stenosis. Normal waveforms and color Doppler signal. LEFT VERTEBRAL ARTERY: Normal flow direction and waveform. IMPRESSION: 1. Bilateral carotid bifurcation and proximal ICA plaque, right greater than left, resulting in less than 50% diameter stenosis. The exam does not exclude plaque ulceration or embolization. Continued surveillance recommended. 2.  Antegrade bilateral vertebral arterial flow. Electronically Signed   By: Lucrezia Europe M.D.   On: 02/29/2016 13:24   Nm Myocar Multi W/spect W/wall Motion / Ef  Result Date: 03/01/2016  Intermediate risk study.  There is a small in size, moderate in severity, mid inferoseptal defect that may respresent artifact or scar.  There is a small in size, mild in severity, reversible defect involving the mid and apical anterior segments seen only on the attenuation corrected images suggestive of artifact and less likely ischemia.  The left ventricular ejection fraction is mildly decreased (45-54%).  The study is technically limited due to motion artifact.      Management plans discussed with the patient, family and they are in agreement.  CODE STATUS:     Code Status Orders        Start     Ordered   02/29/16 1016  Full code  Continuous     02/29/16 1018    Code Status History    Date Active Date Inactive Code Status Order ID Comments User Context   10/27/2014  3:10 PM 10/30/2014  4:14 PM Full Code [id number]   Theodoro Grist, MD Inpatient    Advance Directive Documentation   Flowsheet Row Most Recent Value  Type of Advance Directive  Living will, Healthcare Power of Attorney  Pre-existing out of facility DNR order (yellow form or pink MOST form)  No data  "MOST" Form in Place?  No data      TOTAL TIME TAKING CARE OF THIS PATIENT:40 minutes.    Eliberto Sole M.D on 03/01/2016 at 2:26 PM  Between 7am to 6pm - Pager - [phone] After 6pm go to [email or web address] - password [email or web address] [facility]  Office  [phone]  CC: Primary care physician; Volanda Napoleon, MD

## 2016-03-01 NOTE — Telephone Encounter (Signed)
-----   Message from Blain Pais sent at 03/01/2016  2:34 PM EST ----- Regarding: tcm/ph 2/1 2:00 Christell Faith, PA

## 2016-03-02 NOTE — Telephone Encounter (Signed)
Patient contacted regarding discharge from [facility].  Patient understands to follow up with provider ? On 03/10/16 at 2pm at [facility]  Patient understands discharge instructions? Yes  Patient understands medications and regiment? Yes  Patient understands to bring all medications to this visit? yes

## 2016-03-09 ENCOUNTER — Encounter: Payer: Self-pay | Admitting: Physician Assistant

## 2016-03-10 ENCOUNTER — Encounter: Payer: BLUE CROSS/BLUE SHIELD | Admitting: Physician Assistant

## 2016-03-10 ENCOUNTER — Telehealth: Payer: Self-pay | Admitting: *Deleted

## 2016-03-10 ENCOUNTER — Encounter: Payer: Self-pay | Admitting: Cardiovascular Disease

## 2016-03-10 ENCOUNTER — Ambulatory Visit (INDEPENDENT_AMBULATORY_CARE_PROVIDER_SITE_OTHER): Payer: BLUE CROSS/BLUE SHIELD | Admitting: Cardiovascular Disease

## 2016-03-10 VITALS — BP 98/68 | HR 71 | Ht 72.0 in | Wt 139.6 lb

## 2016-03-10 DIAGNOSIS — I2510 Atherosclerotic heart disease of native coronary artery without angina pectoris: Secondary | ICD-10-CM | POA: Diagnosis not present

## 2016-03-10 DIAGNOSIS — I10 Essential (primary) hypertension: Secondary | ICD-10-CM | POA: Diagnosis not present

## 2016-03-10 DIAGNOSIS — E782 Mixed hyperlipidemia: Secondary | ICD-10-CM

## 2016-03-10 NOTE — Telephone Encounter (Deleted)
Patient had lab work drawn at office visit yesterday.  Lipid panel was not processed.  Spoke with [facility] and they are able to add on Lipid panel to labwork. This will be processed later on today.

## 2016-03-10 NOTE — Telephone Encounter (Signed)
This encounter was created in error - please disregard.  Please disregard, this encounter was documented in error and not intended for this patient's record.

## 2016-03-10 NOTE — Patient Instructions (Signed)
Medication Instructions:  Your physician has recommended you make the following change in your medication:  1- STOP taking Ramipril.   Labwork: NONE  Testing/Procedures: NONE  Follow-Up: Your physician wants you to follow-up in: [place].  You will receive a reminder letter in the mail two months in advance. If you don't receive a letter, please call our office to schedule the follow-up appointment.   If you need a refill on your cardiac medications before your next appointment, please call your pharmacy.

## 2016-03-10 NOTE — Progress Notes (Signed)
Cardiology Office Note   Date:  03/10/2016   ID:  Gerald Jefferson, DOB 1956-01-21, MRN [id number]  PCP:  Volanda Napoleon, MD  Cardiologist:   Kathlyn Sacramento, MD   Chief Complaint  Patient presents with  . other    Follow up [facility]. Pt. c/o a dull ache in chest now, shortness of breath, numbness in face and hands. Meds reviewed by the pt. verbally.      History of Present Illness: Gerald Jefferson is a 61 y.o. male who presents for A follow-up visit regarding coronary artery disease. He has known history of coronary artery disease status post angioplasty and drug-eluting stent placement to the LAD as well as OM in 2008. Later that year, he was diagnosed with pulmonary embolism and was treated with warfarin. While he was on triple therapy, he had GI bleed from unknown source. He had an IVC filter placed.  Cardiac catheterization in 2009 showed patent stents and mild disease in the right coronary artery. Since then, the patient has not had any new cardiac events. His most recent stress test and echocardiogram were done in October of 2011. Both of them were unremarkable.   He was recently hospitalized at [facility] for left facial and arm numbness. He also reported occasional chest tightness. He had experienced a 20 pound weight loss over the last year. Some of the patient's symptoms were felt to be due to TIA. He underwent a nuclear stress test which was a limited study due to motion artifact. It showed possible small inferoseptal fixed defect as well as small reversible defect in the mid and apical anterior segments. These were seen on attenuation corrected images only and thus an artifact was suspected.  Echocardiogram Showed normal LV systolic function with no significant valvular abnormalities. The patient was laid off work in October and since then he has struggled with anxiety and depression. He is shaky all the time and he continues to lose weight. He continues to complain of left arm  and left face numbness. He has no chest pain at the present time.  Past Medical History:  Diagnosis Date  . Acid reflux   . Anxiety   . Arthritis   . Asthma   . Back pain    lower  . Coronary artery disease    a. remote PCI/DES to the LAD and OM in 2008; b. [facility] 2009 patent stents with mild RCA disease; c. lexiscan myoview 2018: intermediate risk with small sized, moderate in severity mild inferoseptal defect that may have represented artifact or scar. There was also a small sized, mild in severity reversible defect involving the mid and apical anterior segments seen only on attenuation corrected imaged   . Depression   . DVT (deep venous thrombosis) ([place]) 2008   a. DVT-leg and lung; b. complicated by GI bleed on triple therapy   . ED (erectile dysfunction)   . Essential hypertension   . GI bleed   . Hyperlipidemia   . Pulmonary embolism ([place])    a. complicated by GI on triple therapy  . RLS (restless legs syndrome)     Past Surgical History:  Procedure Laterality Date  . CARDIAC CATHETERIZATION  07/2007   Patent LAD/OM stents. Stable 40% stenosis in proximal RCA  . CORONARY ANGIOPLASTY WITH STENT PLACEMENT  04/2006   [place] DES to LAD and OM1  . IVC FILTER PLACEMENT ([facility] HX)    . JOINT REPLACEMENT    . NOSE SURGERY    .  PENILE PROSTHESIS IMPLANT N/A 05/20/2015   Procedure: PENILE PROTHESIS INFLATABLE;  Surgeon: Nickie Retort, MD;  Location: [facility] ORS;  Service: Urology;  Laterality: N/A;  . PENILE PROSTHESIS IMPLANT N/A 10/29/2015   Procedure: REVISION PENILE PROTHESIS;  Surgeon: Nickie Retort, MD;  Location: [facility] ORS;  Service: Urology;  Laterality: N/A;  . PERIPHERAL VASCULAR CATHETERIZATION N/A 07/08/2015   unsuccessful, was not removed, only attempted Procedure: IVC Filter Removal;  Surgeon: Algernon Huxley, MD;  Location: [place] CV LAB;  Service: Cardiovascular;  Laterality: N/A;  . TOTAL HIP ARTHROPLASTY Left      Current Outpatient Prescriptions    Medication Sig Dispense Refill  . aspirin EC 81 MG tablet Take 81 mg by mouth daily.    . Azelastine HCl 0.15 % SOLN 1 SPRAY/NARES DAILY  0  . budesonide-formoterol (SYMBICORT) 80-4.5 MCG/ACT inhaler Inhale 2 puffs into the lungs 2 (two) times daily.    . busPIRone (BUSPAR) 7.5 MG tablet Take 15 mg by mouth 2 (two) times daily.   3  . ferrous sulfate 325 (65 FE) MG tablet Take 325 mg by mouth daily.     Marland Kitchen gabapentin (NEURONTIN) 600 MG tablet Take 600 mg by mouth 3 (three) times daily.    Marland Kitchen HYDROcodone-acetaminophen (NORCO) 10-325 MG tablet Take 1 tablet by mouth every 6 (six) hours as needed. Takes qid daily    . omeprazole (PRILOSEC) 20 MG capsule Take 1 capsule (20 mg total) by mouth daily. 30 capsule 6  . Potassium Gluconate (CVS POTASSIUM GLUCONATE) 2 MEQ TABS Take by mouth.    . promethazine (PHENERGAN) 25 MG tablet Take 25 mg by mouth every 6 (six) hours as needed for nausea or vomiting.    Marland Kitchen rOPINIRole (REQUIP) 0.25 MG tablet Take 0.25 mg by mouth at bedtime.    . rosuvastatin (CRESTOR) 20 MG tablet Take 20 mg by mouth at bedtime.     Marland Kitchen tiZANidine (ZANAFLEX) 2 MG tablet Take 2 mg by mouth at bedtime.      . vitamin B-12 1000 MCG tablet Take 1 tablet (1,000 mcg total) by mouth daily. 30 tablet 1   No current facility-administered medications for this visit.     Allergies:   Patient has no known allergies.    Social History:  The patient  reports that he quit smoking about 9 years ago. His smoking use included Cigarettes. He has a 45.00 pack-year smoking history. He has never used smokeless tobacco. He reports that he does not drink alcohol or use drugs.   Family History:  The patient's family history includes Hypertension in his mother.    ROS:  Please see the history of present illness.   Otherwise, review of systems are positive for none.   All other systems are reviewed and negative.    PHYSICAL EXAM: VS:  BP 98/68 (BP Location: Left Arm, Patient Position: Sitting, Cuff  Size: Normal)   Pulse 71   Ht 6' (1.829 m)   Wt 139 lb 9 oz (63.3 kg)   BMI 18.93 kg/m  , BMI Body mass index is 18.93 kg/m. GEN: Well nourished, well developed, in no acute distress  HEENT: normal  Neck: no JVD, carotid bruits, or masses Cardiac: RRR; no murmurs, rubs, or gallops,no edema  Respiratory:  clear to auscultation bilaterally, normal work of breathing GI: soft, nontender, nondistended, + BS MS: no deformity or atrophy  Skin: warm and dry, no rash Neuro:  Strength and sensation are intact Psych: euthymic mood, full  affect   EKG:  EKG is ordered today. The ekg ordered today demonstrates normal sinus rhythm with no significant ST or T wave changes.   Recent Labs: 02/29/2016: ALT 14; BUN 26; Creatinine, Ser 1.02; Hemoglobin 14.3; Platelets 208; Potassium 3.7; Sodium 141    Lipid Panel    Component Value Date/Time   CHOL 128 03/01/2016 0606   TRIG 67 03/01/2016 0606   HDL 69 03/01/2016 0606   CHOLHDL 1.9 03/01/2016 0606   VLDL 13 03/01/2016 0606   LDLCALC 46 03/01/2016 0606      Wt Readings from Last 3 Encounters:  03/10/16 139 lb 9 oz (63.3 kg)  02/29/16 143 lb (64.9 kg)  12/11/15 145 lb 11.2 oz (66.1 kg)       ASSESSMENT AND PLAN:  1.  Coronary artery disease involving native coronary arteries without angina: I don't think his left arm and face numbness is related to cardiac etiology. I reviewed his most recent nuclear stress test which unfortunately was very suboptimal due to motion artifact and diaphragm attenuation. There was a fixed inferior wall defect with normal wall motion suggestive of diaphragm attenuation. I do not see clear evidence of ischemia. His echocardiogram showed normal LV systolic function. I recommend continuing medical therapy.  2. Essential hypertension: The patient has been having significant orthostatic dizziness which could be related to his relatively low blood pressure as he continues to lose weight. I elected to discontinue  ramipril today.  3. Hyperlipidemia: Continue treatment with  rosuvastatin. This is being followed by Dr. Elijio Miles.  4. Anxiety disorder: A lot of the patient's symptoms seem to be related to anxiety. I am going to discuss with Dr. Elijio Miles management options.   Disposition:   FU with me in 6 months.  Signed,  Kathlyn Sacramento, MD  03/10/2016 10:43 AM    [place]

## 2016-08-21 ENCOUNTER — Emergency Department: Payer: BLUE CROSS/BLUE SHIELD

## 2016-08-21 ENCOUNTER — Inpatient Hospital Stay
Admission: EM | Admit: 2016-08-21 | Discharge: 2016-08-23 | DRG: 690 | Disposition: A | Discharge status: Home / Self Care | Payer: BLUE CROSS/BLUE SHIELD | Attending: Specialist | Admitting: Specialist

## 2016-08-21 ENCOUNTER — Encounter: Payer: Self-pay | Admitting: Emergency Medicine

## 2016-08-21 DIAGNOSIS — N179 Acute kidney failure, unspecified: Secondary | ICD-10-CM | POA: Diagnosis present

## 2016-08-21 DIAGNOSIS — E785 Hyperlipidemia, unspecified: Secondary | ICD-10-CM | POA: Diagnosis present

## 2016-08-21 DIAGNOSIS — F419 Anxiety disorder, unspecified: Secondary | ICD-10-CM | POA: Diagnosis present

## 2016-08-21 DIAGNOSIS — Z79899 Other long term (current) drug therapy: Secondary | ICD-10-CM

## 2016-08-21 DIAGNOSIS — Z86711 Personal history of pulmonary embolism: Secondary | ICD-10-CM

## 2016-08-21 DIAGNOSIS — Z87891 Personal history of nicotine dependence: Secondary | ICD-10-CM

## 2016-08-21 DIAGNOSIS — Z955 Presence of coronary angioplasty implant and graft: Secondary | ICD-10-CM

## 2016-08-21 DIAGNOSIS — F329 Major depressive disorder, single episode, unspecified: Secondary | ICD-10-CM | POA: Diagnosis present

## 2016-08-21 DIAGNOSIS — G2581 Restless legs syndrome: Secondary | ICD-10-CM | POA: Diagnosis present

## 2016-08-21 DIAGNOSIS — K219 Gastro-esophageal reflux disease without esophagitis: Secondary | ICD-10-CM | POA: Diagnosis present

## 2016-08-21 DIAGNOSIS — Z7982 Long term (current) use of aspirin: Secondary | ICD-10-CM

## 2016-08-21 DIAGNOSIS — N12 Tubulo-interstitial nephritis, not specified as acute or chronic: Secondary | ICD-10-CM | POA: Diagnosis present

## 2016-08-21 DIAGNOSIS — J449 Chronic obstructive pulmonary disease, unspecified: Secondary | ICD-10-CM | POA: Diagnosis present

## 2016-08-21 DIAGNOSIS — G43909 Migraine, unspecified, not intractable, without status migrainosus: Secondary | ICD-10-CM | POA: Diagnosis present

## 2016-08-21 DIAGNOSIS — I10 Essential (primary) hypertension: Secondary | ICD-10-CM | POA: Diagnosis present

## 2016-08-21 DIAGNOSIS — I2510 Atherosclerotic heart disease of native coronary artery without angina pectoris: Secondary | ICD-10-CM | POA: Diagnosis present

## 2016-08-21 DIAGNOSIS — N10 Acute tubulo-interstitial nephritis: Secondary | ICD-10-CM | POA: Diagnosis not present

## 2016-08-21 DIAGNOSIS — Z8249 Family history of ischemic heart disease and other diseases of the circulatory system: Secondary | ICD-10-CM

## 2016-08-21 DIAGNOSIS — E860 Dehydration: Secondary | ICD-10-CM | POA: Diagnosis present

## 2016-08-21 DIAGNOSIS — Z86718 Personal history of other venous thrombosis and embolism: Secondary | ICD-10-CM

## 2016-08-21 DIAGNOSIS — N281 Cyst of kidney, acquired: Secondary | ICD-10-CM | POA: Diagnosis present

## 2016-08-21 LAB — COMPREHENSIVE METABOLIC PANEL WITH GFR
ALT: 21 U/L (ref 17–63)
AST: 30 U/L (ref 15–41)
Albumin: 3.2 g/dL — ABNORMAL LOW (ref 3.5–5.0)
Alkaline Phosphatase: 67 U/L (ref 38–126)
Anion gap: 10 (ref 5–15)
BUN: 31 mg/dL — ABNORMAL HIGH (ref 6–20)
CO2: 21 mmol/L — ABNORMAL LOW (ref 22–32)
Calcium: 8.6 mg/dL — ABNORMAL LOW (ref 8.9–10.3)
Chloride: 105 mmol/L (ref 101–111)
Creatinine, Ser: 1.49 mg/dL — ABNORMAL HIGH (ref 0.61–1.24)
GFR calc Af Amer: 56 mL/min — ABNORMAL LOW
GFR calc non Af Amer: 49 mL/min — ABNORMAL LOW
Glucose, Bld: 146 mg/dL — ABNORMAL HIGH (ref 65–99)
Potassium: 3.6 mmol/L (ref 3.5–5.1)
Sodium: 136 mmol/L (ref 135–145)
Total Bilirubin: 0.8 mg/dL (ref 0.3–1.2)
Total Protein: 6.2 g/dL — ABNORMAL LOW (ref 6.5–8.1)

## 2016-08-21 LAB — URINALYSIS, COMPLETE (UACMP) WITH MICROSCOPIC
BILIRUBIN URINE: NEGATIVE
GLUCOSE, UA: NEGATIVE mg/dL
KETONES UR: NEGATIVE mg/dL
NITRITE: POSITIVE — AB
PH: 5 (ref 5.0–8.0)
Protein, ur: NEGATIVE mg/dL
SPECIFIC GRAVITY, URINE: 1.009 (ref 1.005–1.030)

## 2016-08-21 LAB — CBC WITH DIFFERENTIAL/PLATELET
Basophils Absolute: 0.1 10*3/uL (ref 0–0.1)
Basophils Relative: 1 %
Eosinophils Absolute: 0.1 10*3/uL (ref 0–0.7)
Eosinophils Relative: 2 %
HEMATOCRIT: 37 % — AB (ref 40.0–52.0)
HEMOGLOBIN: 12.8 g/dL — AB (ref 13.0–18.0)
LYMPHS ABS: 0.3 10*3/uL — AB (ref 1.0–3.6)
Lymphocytes Relative: 3 %
MCH: 30.9 pg (ref 26.0–34.0)
MCHC: 34.6 g/dL (ref 32.0–36.0)
MCV: 89.2 fL (ref 80.0–100.0)
MONOS PCT: 9 %
Monocytes Absolute: 0.8 10*3/uL (ref 0.2–1.0)
NEUTROS ABS: 6.8 10*3/uL — AB (ref 1.4–6.5)
NEUTROS PCT: 85 %
Platelets: 174 10*3/uL (ref 150–440)
RBC: 4.15 MIL/uL — AB (ref 4.40–5.90)
RDW: 12.9 % (ref 11.5–14.5)
WBC: 8.1 10*3/uL (ref 3.8–10.6)

## 2016-08-21 LAB — LIPASE, BLOOD: Lipase: 90 U/L — ABNORMAL HIGH (ref 11–51)

## 2016-08-21 MED ORDER — FERROUS SULFATE 325 (65 FE) MG PO TABS
325.0000 mg | ORAL_TABLET | Freq: Every day | ORAL | Status: DC
  Administered 2016-08-22: 325 mg via ORAL
  Filled 2016-08-21: qty 1

## 2016-08-21 MED ORDER — VITAMIN B-12 1000 MCG PO TABS
1000.0000 ug | ORAL_TABLET | Freq: Every day | ORAL | Status: DC
  Administered 2016-08-22: 1000 ug via ORAL
  Filled 2016-08-21: qty 1

## 2016-08-21 MED ORDER — IOPAMIDOL (ISOVUE-300) INJECTION 61%
100.0000 mL | Freq: Once | INTRAVENOUS | Status: AC | PRN
  Administered 2016-08-21: 100 mL via INTRAVENOUS

## 2016-08-21 MED ORDER — ASPIRIN EC 81 MG PO TBEC
81.0000 mg | DELAYED_RELEASE_TABLET | Freq: Every day | ORAL | Status: DC
Start: 2016-08-22 — End: 2016-08-23
  Administered 2016-08-22: 81 mg via ORAL
  Filled 2016-08-21: qty 1

## 2016-08-21 MED ORDER — DRONABINOL 2.5 MG PO CAPS
5.0000 mg | ORAL_CAPSULE | Freq: Two times a day (BID) | ORAL | Status: DC
  Administered 2016-08-21 – 2016-08-22 (×3): 5 mg via ORAL
  Filled 2016-08-21 (×3): qty 2

## 2016-08-21 MED ORDER — ENOXAPARIN SODIUM 40 MG/0.4ML ~~LOC~~ SOLN
40.0000 mg | SUBCUTANEOUS | Status: DC
  Administered 2016-08-21 – 2016-08-22 (×2): 40 mg via SUBCUTANEOUS
  Filled 2016-08-21 (×2): qty 0.4

## 2016-08-21 MED ORDER — SODIUM CHLORIDE 0.9 % IV SOLN
INTRAVENOUS | Status: DC
  Administered 2016-08-21 – 2016-08-23 (×5): via INTRAVENOUS

## 2016-08-21 MED ORDER — MORPHINE SULFATE (PF) 4 MG/ML IV SOLN
4.0000 mg | Freq: Once | INTRAVENOUS | Status: AC
  Administered 2016-08-21: 4 mg via INTRAVENOUS
  Filled 2016-08-21: qty 1

## 2016-08-21 MED ORDER — IOPAMIDOL (ISOVUE-300) INJECTION 61%
30.0000 mL | Freq: Once | INTRAVENOUS | Status: AC | PRN
  Administered 2016-08-21: 30 mL via ORAL

## 2016-08-21 MED ORDER — HYDROCODONE-ACETAMINOPHEN 5-325 MG PO TABS
1.0000 | ORAL_TABLET | ORAL | Status: DC | PRN
  Administered 2016-08-21 – 2016-08-23 (×6): 2 via ORAL
  Filled 2016-08-21 (×6): qty 2

## 2016-08-21 MED ORDER — ACETAMINOPHEN 650 MG RE SUPP: 650.0000 mg | Freq: Four times a day (QID) | RECTAL | Status: DC | PRN

## 2016-08-21 MED ORDER — ESCITALOPRAM OXALATE 10 MG PO TABS
10.0000 mg | ORAL_TABLET | Freq: Every day | ORAL | Status: DC
  Administered 2016-08-22: 10 mg via ORAL
  Filled 2016-08-21 (×2): qty 1

## 2016-08-21 MED ORDER — BUSPIRONE HCL 10 MG PO TABS
15.0000 mg | ORAL_TABLET | Freq: Two times a day (BID) | ORAL | Status: DC
  Administered 2016-08-21 – 2016-08-22 (×3): 15 mg via ORAL
  Filled 2016-08-21: qty 1
  Filled 2016-08-21: qty 1.5
  Filled 2016-08-21: qty 1
  Filled 2016-08-21: qty 1.5
  Filled 2016-08-21: qty 1

## 2016-08-21 MED ORDER — MOMETASONE FURO-FORMOTEROL FUM 100-5 MCG/ACT IN AERO
2.0000 | INHALATION_SPRAY | Freq: Two times a day (BID) | RESPIRATORY_TRACT | Status: DC
  Administered 2016-08-21 – 2016-08-22 (×3): 2 via RESPIRATORY_TRACT
  Filled 2016-08-21: qty 8.8

## 2016-08-21 MED ORDER — PHENAZOPYRIDINE HCL 100 MG PO TABS
100.0000 mg | ORAL_TABLET | Freq: Two times a day (BID) | ORAL | Status: DC
  Administered 2016-08-21 – 2016-08-22 (×3): 100 mg via ORAL
  Filled 2016-08-21 (×5): qty 1

## 2016-08-21 MED ORDER — ROPINIROLE HCL 0.25 MG PO TABS
0.2500 mg | ORAL_TABLET | Freq: Every day | ORAL | Status: DC
  Administered 2016-08-21 – 2016-08-22 (×2): 0.25 mg via ORAL
  Filled 2016-08-21 (×3): qty 1

## 2016-08-21 MED ORDER — ONDANSETRON HCL 4 MG/2ML IJ SOLN
4.0000 mg | Freq: Once | INTRAMUSCULAR | Status: AC
  Administered 2016-08-21: 4 mg via INTRAVENOUS
  Filled 2016-08-21: qty 2

## 2016-08-21 MED ORDER — PANTOPRAZOLE SODIUM 40 MG PO TBEC
40.0000 mg | DELAYED_RELEASE_TABLET | Freq: Every day | ORAL | Status: DC
  Administered 2016-08-22: 40 mg via ORAL
  Filled 2016-08-21: qty 1

## 2016-08-21 MED ORDER — GABAPENTIN 600 MG PO TABS
600.0000 mg | ORAL_TABLET | Freq: Three times a day (TID) | ORAL | Status: DC
  Administered 2016-08-21 – 2016-08-22 (×5): 600 mg via ORAL
  Filled 2016-08-21 (×5): qty 1

## 2016-08-21 MED ORDER — PROMETHAZINE HCL 25 MG PO TABS
25.0000 mg | ORAL_TABLET | Freq: Four times a day (QID) | ORAL | Status: DC | PRN
  Administered 2016-08-22: 25 mg via ORAL
  Filled 2016-08-21 (×2): qty 1

## 2016-08-21 MED ORDER — ACETAMINOPHEN 325 MG PO TABS
650.0000 mg | ORAL_TABLET | Freq: Four times a day (QID) | ORAL | Status: DC | PRN
  Filled 2016-08-21: qty 2

## 2016-08-21 MED ORDER — CLONAZEPAM 1 MG PO TABS
1.0000 mg | ORAL_TABLET | Freq: Every morning | ORAL | Status: DC
  Administered 2016-08-22: 1 mg via ORAL
  Filled 2016-08-21: qty 1

## 2016-08-21 MED ORDER — DEXTROSE 5 % IV SOLN
2.0000 g | Freq: Once | INTRAVENOUS | Status: AC
  Administered 2016-08-21: 2 g via INTRAVENOUS
  Filled 2016-08-21: qty 2

## 2016-08-21 MED ORDER — TIZANIDINE HCL 2 MG PO TABS
2.0000 mg | ORAL_TABLET | Freq: Every day | ORAL | Status: DC
  Administered 2016-08-21 – 2016-08-22 (×2): 2 mg via ORAL
  Filled 2016-08-21 (×3): qty 1

## 2016-08-21 MED ORDER — DEXTROSE 5 % IV SOLN
1.0000 g | INTRAVENOUS | Status: DC
  Administered 2016-08-22 – 2016-08-23 (×2): 1 g via INTRAVENOUS
  Filled 2016-08-21 (×2): qty 10

## 2016-08-21 MED ORDER — ROSUVASTATIN CALCIUM 10 MG PO TABS
20.0000 mg | ORAL_TABLET | Freq: Every day | ORAL | Status: DC
  Administered 2016-08-21 – 2016-08-22 (×2): 20 mg via ORAL
  Filled 2016-08-21 (×2): qty 2

## 2016-08-21 MED ORDER — SODIUM CHLORIDE 0.9 % IV BOLUS (SEPSIS)
1000.0000 mL | Freq: Once | INTRAVENOUS | Status: AC
  Administered 2016-08-21: 1000 mL via INTRAVENOUS

## 2016-08-21 NOTE — Progress Notes (Signed)
Pharmacy Antibiotic Note  Gerald Jefferson is a 61 y.o. male admitted on 08/21/2016 with UTI/Pyelonephritis.  Pharmacy has been consulted for ceftriaxone dosing. Patient received ceftriaxone 2g IV x 1 dose in ED.   Plan: Will start ceftriaxone 1g IV every 24 hours.   Height: 6' (182.9 cm) Weight: 143 lb (64.9 kg) IBW/kg (Calculated) : 77.6  Temp (24hrs), Avg:97.7 F (36.5 C), Min:97.7 F (36.5 C), Max:97.7 F (36.5 C)   Recent Labs Lab 08/21/16 0756  WBC 8.1  CREATININE 1.49*    Estimated Creatinine Clearance: 47.2 mL/min (A) (by C-G formula based on SCr of 1.49 mg/dL (H)).    No Known Allergies  Antimicrobials this admission: 7/15 ceftriaxone >>   Dose adjustments this admission:  Microbiology results: 7/15 UCx: sent   Thank you for allowing pharmacy to be a part of this patient's care.  Pernell Dupre, PharmD, BCPS Clinical Pharmacist 08/21/2016 10:36 AM

## 2016-08-21 NOTE — ED Notes (Signed)
Admitting team at bedside.

## 2016-08-21 NOTE — ED Notes (Signed)
edp at bedside updating pt on poc and results

## 2016-08-21 NOTE — ED Triage Notes (Signed)
Pt c/o RLQ abdominal pain and right flank pain. Dx with UTI Thursday and put on macrobid but has been having fevers up to 103 per pt despite this. Pain worse.  Nausea no vomiting. Took tylenol this morning at 4 am. Also c/o headache.

## 2016-08-21 NOTE — H&P (Signed)
Gerald Jefferson is an 61 y.o. male.   Chief Complaint: Right flank pain and fever HPI: This is a 61 year old male who last Monday started having right flank pain and frequent urination and burning. Thursday he went see his primary care doctor and was prescribed nitrofurantoin. Since then the pain is gotten worse and he states spiked fevers he said at 103. He'll be admitted for IV antibiotics since he failed outpatient therapy.  Past Medical History:  Diagnosis Date  . Acid reflux   . Anxiety   . Arthritis   . Asthma   . Back pain    lower  . Coronary artery disease    a. remote PCI/DES to the LAD and OM in 2008; b. [facility] 2009 patent stents with mild RCA disease; c. lexiscan myoview 2018: intermediate risk with small sized, moderate in severity mild inferoseptal defect that may have represented artifact or scar. There was also a small sized, mild in severity reversible defect involving the mid and apical anterior segments seen only on attenuation corrected imaged   . Depression   . DVT (deep venous thrombosis) ([place]) 2008   a. DVT-leg and lung; b. complicated by GI bleed on triple therapy   . ED (erectile dysfunction)   . Essential hypertension   . GI bleed   . Hyperlipidemia   . Pulmonary embolism ([place])    a. complicated by GI on triple therapy  . RLS (restless legs syndrome)     Past Surgical History:  Procedure Laterality Date  . CARDIAC CATHETERIZATION  07/2007   Patent LAD/OM stents. Stable 40% stenosis in proximal RCA  . CORONARY ANGIOPLASTY WITH STENT PLACEMENT  04/2006   Cypher DES to LAD and OM1  . IVC FILTER PLACEMENT ([facility] HX)    . JOINT REPLACEMENT    . NOSE SURGERY    . PENILE PROSTHESIS IMPLANT N/A 05/20/2015   Procedure: PENILE PROTHESIS INFLATABLE;  Surgeon: Nickie Retort, MD;  Location: [facility] ORS;  Service: Urology;  Laterality: N/A;  . PENILE PROSTHESIS IMPLANT N/A 10/29/2015   Procedure: REVISION PENILE PROTHESIS;  Surgeon: Nickie Retort, MD;  Location: [facility]  ORS;  Service: Urology;  Laterality: N/A;  . PERIPHERAL VASCULAR CATHETERIZATION N/A 07/08/2015   unsuccessful, was not removed, only attempted Procedure: IVC Filter Removal;  Surgeon: Algernon Huxley, MD;  Location: [facility];  Service: Cardiovascular;  Laterality: N/A;  . TOTAL HIP ARTHROPLASTY Left     Family History  Problem Relation Age of Onset  . Hypertension Mother   . Kidney cancer Neg Hx   . Kidney disease Neg Hx   . Prostate cancer Neg Hx    Social History:  reports that he quit smoking about 10 years ago. His smoking use included Cigarettes. He has a 45.00 pack-year smoking history. He has never used smokeless tobacco. He reports that he does not drink alcohol or use drugs.  Allergies: No Known Allergies   (Not in a hospital admission)  Results for orders placed or performed during the hospital encounter of 08/21/16 (from the past 48 hour(s))  CBC with Differential     Status: Abnormal   Collection Time: 08/21/16  7:56 AM  Result Value Ref Range   WBC 8.1 3.8 - 10.6 K/uL   RBC 4.15 (L) 4.40 - 5.90 MIL/uL   Hemoglobin 12.8 (L) 13.0 - 18.0 g/dL   HCT 37.0 (L) 40.0 - 52.0 %   MCV 89.2 80.0 - 100.0 fL   MCH 30.9 26.0 -  34.0 pg   MCHC 34.6 32.0 - 36.0 g/dL   RDW 12.9 11.5 - 14.5 %   Platelets 174 150 - 440 K/uL   Neutrophils Relative % 85 %   Neutro Abs 6.8 (H) 1.4 - 6.5 K/uL   Lymphocytes Relative 3 %   Lymphs Abs 0.3 (L) 1.0 - 3.6 K/uL   Monocytes Relative 9 %   Monocytes Absolute 0.8 0.2 - 1.0 K/uL   Eosinophils Relative 2 %   Eosinophils Absolute 0.1 0 - 0.7 K/uL   Basophils Relative 1 %   Basophils Absolute 0.1 0 - 0.1 K/uL  Comprehensive metabolic panel     Status: Abnormal   Collection Time: 08/21/16  7:56 AM  Result Value Ref Range   Sodium 136 135 - 145 mmol/L   Potassium 3.6 3.5 - 5.1 mmol/L   Chloride 105 101 - 111 mmol/L   CO2 21 (L) 22 - 32 mmol/L   Glucose, Bld 146 (H) 65 - 99 mg/dL   BUN 31 (H) 6 - 20 mg/dL   Creatinine, Ser 1.49 (H)  0.61 - 1.24 mg/dL   Calcium 8.6 (L) 8.9 - 10.3 mg/dL   Total Protein 6.2 (L) 6.5 - 8.1 g/dL   Albumin 3.2 (L) 3.5 - 5.0 g/dL   AST 30 15 - 41 U/L   ALT 21 17 - 63 U/L   Alkaline Phosphatase 67 38 - 126 U/L   Total Bilirubin 0.8 0.3 - 1.2 mg/dL   GFR calc non Af Amer 49 (L) >60 mL/min   GFR calc Af Amer 56 (L) >60 mL/min    Comment: (NOTE) The eGFR has been calculated using the CKD EPI equation. This calculation has not been validated in all clinical situations. eGFR's persistently <60 mL/min signify possible Chronic Kidney Disease.    Anion gap 10 5 - 15  Lipase, blood     Status: Abnormal   Collection Time: 08/21/16  7:56 AM  Result Value Ref Range   Lipase 90 (H) 11 - 51 U/L  Urinalysis, Complete w Microscopic     Status: Abnormal   Collection Time: 08/21/16  7:58 AM  Result Value Ref Range   Color, Urine AMBER (A) YELLOW    Comment: BIOCHEMICALS MAY BE AFFECTED BY COLOR   APPearance HAZY (A) CLEAR   Specific Gravity, Urine 1.009 1.005 - 1.030   pH 5.0 5.0 - 8.0   Glucose, UA NEGATIVE NEGATIVE mg/dL   Hgb urine dipstick MODERATE (A) NEGATIVE   Bilirubin Urine NEGATIVE NEGATIVE   Ketones, ur NEGATIVE NEGATIVE mg/dL   Protein, ur NEGATIVE NEGATIVE mg/dL   Nitrite POSITIVE (A) NEGATIVE   Leukocytes, UA LARGE (A) NEGATIVE   RBC / HPF 6-30 0 - 5 RBC/hpf   WBC, UA TOO NUMEROUS TO COUNT 0 - 5 WBC/hpf   Bacteria, UA RARE (A) NONE SEEN   Squamous Epithelial / LPF 0-5 (A) NONE SEEN   WBC Clumps PRESENT    Mucous PRESENT    Ct Abdomen Pelvis W Contrast  Result Date: 08/21/2016 CLINICAL DATA:  Right lower quadrant and right flank pain. EXAM: CT ABDOMEN AND PELVIS WITH CONTRAST TECHNIQUE: Multidetector CT imaging of the abdomen and pelvis was performed using the standard protocol following bolus administration of intravenous contrast. CONTRAST:  113m ISOVUE-300 IOPAMIDOL (ISOVUE-300) INJECTION 61% COMPARISON:  06/30/2015 FINDINGS: Lower chest: No acute abnormality. Hepatobiliary:  No focal hepatic abnormality. Gallbladder unremarkable. Pancreas: No focal abnormality or ductal dilatation. Spleen: No focal abnormality.  Normal size. Adrenals/Urinary Tract: Bilateral  renal cysts. No hydronephrosis. Complex cystic area in the midpole of the right kidney on image 15 of delayed renal series (series 5). Adrenal glands and urinary bladder unremarkable. Stomach/Bowel: Normal appendix. Sigmoid diverticulosis. No active diverticulitis. Stomach and small bowel decompressed. Vascular/Lymphatic: Diffuse aortic and iliac calcifications. No aneurysm or adenopathy. IVC filter in place. The legs extend through the wall of the IVC, unchanged since prior study. Reproductive: Penile implant noted. Other: No free fluid or free air. Musculoskeletal: No acute bony abnormality. IMPRESSION: Complex cysts in the midpole of the right kidney. These warrant additional characterisation or follow-up. These could be further characterized with MRI with and without contrast or followed with repeat CT or ultrasound in 6 months. IVC filter legs extend through the wall of the IVC, stable since prior study. Aortoiliac atherosclerosis. Sigmoid diverticulosis. No acute findings. Electronically Signed   By: Rolm Baptise M.D.   On: 08/21/2016 09:26    Review of Systems  Constitutional: Negative for chills and fever.  HENT: Negative for hearing loss.   Eyes: Negative for blurred vision.  Respiratory: Negative for shortness of breath.   Cardiovascular: Negative for chest pain.  Gastrointestinal: Positive for abdominal pain.  Genitourinary: Positive for flank pain and frequency.  Musculoskeletal: Positive for joint pain.  Skin: Negative for rash.  Neurological: Negative for dizziness.    Blood pressure 118/80, pulse 68, temperature 97.7 F (36.5 C), temperature source Oral, resp. rate 18, height 6' (1.829 m), weight 64.9 kg (143 lb), SpO2 96 %. Physical Exam  Constitutional: He is oriented to person, place, and time.  He appears well-developed and well-nourished. No distress.  HENT:  Head: Normocephalic and atraumatic.  Mouth/Throat: Oropharynx is clear and moist. No oropharyngeal exudate.  Eyes: Pupils are equal, round, and reactive to light. No scleral icterus.  Neck: Neck supple. No tracheal deviation present. No thyromegaly present.  Cardiovascular: Normal rate and regular rhythm.   No murmur heard. Respiratory: Effort normal and breath sounds normal. No respiratory distress. He exhibits no tenderness.  GI: Soft. Bowel sounds are normal.  Right flank tenderness.  Musculoskeletal: Normal range of motion. He exhibits no edema or tenderness.  Lymphadenopathy:    He has no cervical adenopathy.  Neurological: He is alert and oriented to person, place, and time.  Skin: Skin is warm and dry.     Assessment/Plan 1. Pyelonephritis. We will start Rocephin and get urine cultures. He said he had a culture at Dr. Idamae Lusher office especially resulted Monday. Patient has no obstructive symptoms predispose him. He does have a penile implant/63 year old but has not had any obstruction from this. No history of stones. He does have cyst in his kidneys but are not causing obstruction. 2. Acute renal failure. Likely secondary to dehydration from poor by mouth intake and fever. We'll give IV fluids and see how her response. 3. History of PE and DVT. Patient has IVC filter in place. The scan that was done for mild number his abdominal pain shows the legs the IVC filter sticking through the IVC. However this is stable from his previous scan. Patient tells me they try to retrieve the filter unsuccessfully. 4. Coronary artery disease. Stable. Continue current medications next line total time spent 35 minutes    Baxter Hire, MD 08/21/2016, 10:29 AM

## 2016-08-21 NOTE — ED Provider Notes (Signed)
[facility] Emergency Department Provider Note  ____________________________________________   First MD Initiated Contact with Patient 08/21/16 [phone]     (approximate)  I have reviewed the triage vital signs and the nursing notes.   HISTORY  Chief Complaint Abdominal Pain   HPI Gerald Jefferson is a 61 y.o. male with a history of coronary artery disease who is presenting to the emergency department today with 4 days of right lower abdominal pain as well as burning with urination. He was originally seen by his primary care doctor this past Thursday when he was prescribed Macrobid and Pyridium. He is getting both of these medications but has had increasing pain as well as intermittent fevers up to 103. He says that he has been getting his blood pressure at home which has also been low. He says his pain as a 9 out of 10 the right ear his abdomen as well as the right lower back at this time. He says that he has been nauseous but without any vomiting. Also complaining of headache which is also worsened over the past several days as his illness seems to have progressed. Denies any history of UTI. Denies any history of kidney stones. Still says that he has his gallbladder and appendix.   Past Medical History:  Diagnosis Date  . Acid reflux   . Anxiety   . Arthritis   . Asthma   . Back pain    lower  . Coronary artery disease    a. remote PCI/DES to the LAD and OM in 2008; b. [facility] 2009 patent stents with mild RCA disease; c. lexiscan myoview 2018: intermediate risk with small sized, moderate in severity mild inferoseptal defect that may have represented artifact or scar. There was also a small sized, mild in severity reversible defect involving the mid and apical anterior segments seen only on attenuation corrected imaged   . Depression   . DVT (deep venous thrombosis) ([place]) 2008   a. DVT-leg and lung; b. complicated by GI bleed on triple therapy   . ED (erectile  dysfunction)   . Essential hypertension   . GI bleed   . Hyperlipidemia   . Pulmonary embolism ([place])    a. complicated by GI on triple therapy  . RLS (restless legs syndrome)     Patient Active Problem List   Diagnosis Date Noted  . Left sided numbness 02/29/2016  . Chest pressure 02/29/2016  . History of pulmonary embolism 02/29/2016  . S/P IVC filter 02/29/2016  . Chest pain with moderate risk for cardiac etiology   . Numbness and tingling of left side of face   . TIA (transient ischemic attack)   . Erectile dysfunction 05/20/2015  . Myositis 10/27/2014  . Myofasciitis 10/27/2014  . Renal insufficiency 10/27/2014  . Leukocytosis 10/27/2014  . Polyneuropathy 08/26/2014  . Hip pain 03/12/2012  . Coronary artery disease   . Hyperlipidemia   . Essential hypertension   . ED (erectile dysfunction)     Past Surgical History:  Procedure Laterality Date  . CARDIAC CATHETERIZATION  07/2007   Patent LAD/OM stents. Stable 40% stenosis in proximal RCA  . CORONARY ANGIOPLASTY WITH STENT PLACEMENT  04/2006   Cypher DES to LAD and OM1  . IVC FILTER PLACEMENT ([facility] HX)    . JOINT REPLACEMENT    . NOSE SURGERY    . PENILE PROSTHESIS IMPLANT N/A 05/20/2015   Procedure: PENILE PROTHESIS INFLATABLE;  Surgeon: Nickie Retort, MD;  Location: [facility] ORS;  Service: Urology;  Laterality: N/A;  . PENILE PROSTHESIS IMPLANT N/A 10/29/2015   Procedure: REVISION PENILE PROTHESIS;  Surgeon: Nickie Retort, MD;  Location: [facility] ORS;  Service: Urology;  Laterality: N/A;  . PERIPHERAL VASCULAR CATHETERIZATION N/A 07/08/2015   unsuccessful, was not removed, only attempted Procedure: IVC Filter Removal;  Surgeon: Algernon Huxley, MD;  Location: [facility];  Service: Cardiovascular;  Laterality: N/A;  . TOTAL HIP ARTHROPLASTY Left     Prior to Admission medications   Medication Sig Start Date End Date Taking? Authorizing Provider  aspirin EC 81 MG tablet Take 81 mg by mouth daily.    [provider]  Azelastine HCl 0.15 % SOLN 1 SPRAY/NARES DAILY 03/18/15   [provider]  budesonide-formoterol (SYMBICORT) 80-4.5 MCG/ACT inhaler Inhale 2 puffs into the lungs 2 (two) times daily.    [provider]  busPIRone (BUSPAR) 7.5 MG tablet Take 15 mg by mouth 2 (two) times daily.  04/13/15   [provider]  ferrous sulfate 325 (65 FE) MG tablet Take 325 mg by mouth daily.     [provider]  gabapentin (NEURONTIN) 600 MG tablet Take 600 mg by mouth 3 (three) times daily.    [provider]  HYDROcodone-acetaminophen (NORCO) 10-325 MG tablet Take 1 tablet by mouth every 6 (six) hours as needed. Takes qid daily    [provider]  omeprazole (PRILOSEC) 20 MG capsule Take 1 capsule (20 mg total) by mouth daily. 08/29/11   Wellington Hampshire, MD  Potassium Gluconate ([employer] POTASSIUM GLUCONATE) 2 MEQ TABS Take by mouth.    [provider]  promethazine (PHENERGAN) 25 MG tablet Take 25 mg by mouth every 6 (six) hours as needed for nausea or vomiting.    [provider]  rOPINIRole (REQUIP) 0.25 MG tablet Take 0.25 mg by mouth at bedtime.    [provider]  rosuvastatin (CRESTOR) 20 MG tablet Take 20 mg by mouth at bedtime.     [provider]  tiZANidine (ZANAFLEX) 2 MG tablet Take 2 mg by mouth at bedtime.      [provider]  vitamin B-12 1000 MCG tablet Take 1 tablet (1,000 mcg total) by mouth daily. 10/30/14   [name], MD    Allergies Patient has no known allergies.  Family History  Problem Relation Age of Onset  . Hypertension Mother   . Kidney cancer Neg Hx   . Kidney disease Neg Hx   . Prostate cancer Neg Hx     Social History Social History  Substance Use Topics  . Smoking status: Former Smoker    Packs/day: 1.50    Years: 30.00    Types: Cigarettes    Quit date: 04/08/2006  . Smokeless tobacco: Never Used  . Alcohol use No    Review of Systems  Constitutional:  No fever/chills Eyes: No visual changes. ENT: No sore throat. Cardiovascular: Denies chest pain. Respiratory: Denies shortness of breath. Gastrointestinal:   No diarrhea.  No constipation. Genitourinary: Negative for dysuria. Musculoskeletal: as abvoe Skin: Negative for rash. Neurological: Negative for focal weakness or numbness.   ____________________________________________   PHYSICAL EXAM:  VITAL SIGNS: ED Triage Vitals [08/21/16 0738]  Enc Vitals Group     BP 114/73     Pulse Rate 75     Resp 18     Temp 97.7 F (36.5 C)     Temp Source Oral     SpO2 96 %  Weight 143 lb (64.9 kg)     Height 6' (1.829 m)     Head Circumference      Peak Flow      Pain Score 6     Pain Loc      Pain Edu?      Excl. in [place]?     Constitutional: Alert and oriented. Well appearing and in no acute distress. Eyes: Conjunctivae are normal.  Head: Atraumatic. Nose: No congestion/rhinnorhea. Mouth/Throat: Mucous membranes are moist.  Neck: No stridor.   Cardiovascular: Normal rate, regular rhythm. Grossly normal heart sounds.  Respiratory: Normal respiratory effort.  No retractions. Lungs CTAB. Gastrointestinal: Soft With right upper as well as right lower quadrant tenderness to palpation. No distention. Right-sided low CVA tenderness to palpation. Musculoskeletal: No lower extremity tenderness nor edema.  No joint effusions. Neurologic:  Normal speech and language. No gross focal neurologic deficits are appreciated. Skin:  Skin is warm, dry and intact. No rash noted. Psychiatric: Mood and affect are normal. Speech and behavior are normal.  ____________________________________________   LABS (all labs ordered are listed, but only abnormal results are displayed)  Labs Reviewed  CBC WITH DIFFERENTIAL/PLATELET - Abnormal; Notable for the following:       Result Value   RBC 4.15 (*)    Hemoglobin 12.8 (*)    HCT 37.0 (*)    Neutro Abs 6.8 (*)    Lymphs Abs 0.3 (*)    All other  components within normal limits  COMPREHENSIVE METABOLIC PANEL - Abnormal; Notable for the following:    CO2 21 (*)    Glucose, Bld 146 (*)    BUN 31 (*)    Creatinine, Ser 1.49 (*)    Calcium 8.6 (*)    Total Protein 6.2 (*)    Albumin 3.2 (*)    GFR calc non Af Amer 49 (*)    GFR calc Af Amer 56 (*)    All other components within normal limits  LIPASE, BLOOD - Abnormal; Notable for the following:    Lipase 90 (*)    All other components within normal limits  URINALYSIS, COMPLETE (UACMP) WITH MICROSCOPIC - Abnormal; Notable for the following:    Color, Urine AMBER (*)    APPearance HAZY (*)    Hgb urine dipstick MODERATE (*)    Nitrite POSITIVE (*)    Leukocytes, UA LARGE (*)    Bacteria, UA RARE (*)    Squamous Epithelial / LPF 0-5 (*)    All other components within normal limits  URINE CULTURE   ____________________________________________  EKG   ____________________________________________  RADIOLOGY  CT of the abdomen and pelvis with complex cysts to the mid pole of the right kidney. IVC filter in place. ____________________________________________   PROCEDURES  Procedure(s) performed:   Procedures  Critical Care performed:   ____________________________________________   INITIAL IMPRESSION / ASSESSMENT AND PLAN / ED COURSE  Pertinent labs & imaging results that were available during my care of the patient were reviewed by me and considered in my medical decision making (see chart for details).  ----------------------------------------- 9:56 AM on 08/21/2016 -----------------------------------------  Patient still a 7 out of 10 pain after 2 doses of morphine. I discussed with him admission to the hospital versus discharge to home. He says that he would prefer to be admitted. I think this is reasonable given his level of pain and his failure of outpatient antibiotics after several days of Macrobid. I also discussed the CT results with Dr. Erlene Quan who  will be evaluating the images. Signed out to Dr.Weiting of the hospitalist service.      ____________________________________________   FINAL CLINICAL IMPRESSION(S) / ED DIAGNOSES  Pyelonephritis. Failure of outpatient treatment.    NEW MEDICATIONS STARTED DURING THIS VISIT:  New Prescriptions   No medications on file     Note:  This document was prepared using Dragon voice recognition software and may include unintentional dictation errors.     Orbie Pyo, MD 08/21/16 [phone]

## 2016-08-22 DIAGNOSIS — Z86718 Personal history of other venous thrombosis and embolism: Secondary | ICD-10-CM | POA: Diagnosis not present

## 2016-08-22 DIAGNOSIS — Z79899 Other long term (current) drug therapy: Secondary | ICD-10-CM | POA: Diagnosis not present

## 2016-08-22 DIAGNOSIS — N281 Cyst of kidney, acquired: Secondary | ICD-10-CM | POA: Diagnosis present

## 2016-08-22 DIAGNOSIS — G43909 Migraine, unspecified, not intractable, without status migrainosus: Secondary | ICD-10-CM | POA: Diagnosis present

## 2016-08-22 DIAGNOSIS — Z7982 Long term (current) use of aspirin: Secondary | ICD-10-CM | POA: Diagnosis not present

## 2016-08-22 DIAGNOSIS — F419 Anxiety disorder, unspecified: Secondary | ICD-10-CM | POA: Diagnosis present

## 2016-08-22 DIAGNOSIS — Z87891 Personal history of nicotine dependence: Secondary | ICD-10-CM | POA: Diagnosis not present

## 2016-08-22 DIAGNOSIS — G2581 Restless legs syndrome: Secondary | ICD-10-CM | POA: Diagnosis present

## 2016-08-22 DIAGNOSIS — Z8249 Family history of ischemic heart disease and other diseases of the circulatory system: Secondary | ICD-10-CM | POA: Diagnosis not present

## 2016-08-22 DIAGNOSIS — I10 Essential (primary) hypertension: Secondary | ICD-10-CM | POA: Diagnosis present

## 2016-08-22 DIAGNOSIS — N179 Acute kidney failure, unspecified: Secondary | ICD-10-CM | POA: Diagnosis present

## 2016-08-22 DIAGNOSIS — N10 Acute tubulo-interstitial nephritis: Secondary | ICD-10-CM | POA: Diagnosis present

## 2016-08-22 DIAGNOSIS — E860 Dehydration: Secondary | ICD-10-CM | POA: Diagnosis present

## 2016-08-22 DIAGNOSIS — J449 Chronic obstructive pulmonary disease, unspecified: Secondary | ICD-10-CM | POA: Diagnosis present

## 2016-08-22 DIAGNOSIS — K219 Gastro-esophageal reflux disease without esophagitis: Secondary | ICD-10-CM | POA: Diagnosis present

## 2016-08-22 DIAGNOSIS — F329 Major depressive disorder, single episode, unspecified: Secondary | ICD-10-CM | POA: Diagnosis present

## 2016-08-22 DIAGNOSIS — Z86711 Personal history of pulmonary embolism: Secondary | ICD-10-CM | POA: Diagnosis not present

## 2016-08-22 DIAGNOSIS — Z955 Presence of coronary angioplasty implant and graft: Secondary | ICD-10-CM | POA: Diagnosis not present

## 2016-08-22 DIAGNOSIS — E785 Hyperlipidemia, unspecified: Secondary | ICD-10-CM | POA: Diagnosis present

## 2016-08-22 DIAGNOSIS — I2510 Atherosclerotic heart disease of native coronary artery without angina pectoris: Secondary | ICD-10-CM | POA: Diagnosis present

## 2016-08-22 LAB — BASIC METABOLIC PANEL
Anion gap: 6 (ref 5–15)
BUN: 17 mg/dL (ref 6–20)
CO2: 26 mmol/L (ref 22–32)
Calcium: 8.5 mg/dL — ABNORMAL LOW (ref 8.9–10.3)
Chloride: 111 mmol/L (ref 101–111)
Creatinine, Ser: 0.93 mg/dL (ref 0.61–1.24)
GFR calc Af Amer: >60 mL/min (ref >60)
Glucose, Bld: 99 mg/dL (ref 65–99)
POTASSIUM: 4.3 mmol/L (ref 3.5–5.1)
SODIUM: 143 mmol/L (ref 135–145)

## 2016-08-22 LAB — URINE CULTURE

## 2016-08-22 MED ORDER — KETOROLAC TROMETHAMINE 30 MG/ML IJ SOLN
30.0000 mg | Freq: Once | INTRAMUSCULAR | Status: AC
  Administered 2016-08-22: 30 mg via INTRAVENOUS
  Filled 2016-08-22: qty 1

## 2016-08-22 MED ORDER — BUTALBITAL-APAP-CAFFEINE 50-325-40 MG PO TABS
2.0000 | ORAL_TABLET | ORAL | Status: DC | PRN
  Administered 2016-08-22 – 2016-08-23 (×2): 2 via ORAL
  Filled 2016-08-22 (×3): qty 2

## 2016-08-22 NOTE — Progress Notes (Signed)
[place] [facility] [place] NAME: Gerald Jefferson    MR#:  [id number]  DATE OF BIRTH:  [birth year]  SUBJECTIVE:   Patient here due to acute pyelonephritis. Still having some right-sided flank abdominal pain. Also complaining of a headache. No nausea, vomiting or any fever.  REVIEW OF SYSTEMS:    Review of Systems  Constitutional: Negative for chills and fever.  HENT: Negative for congestion and tinnitus.   Eyes: Negative for blurred vision and double vision.  Respiratory: Negative for cough, shortness of breath and wheezing.   Cardiovascular: Negative for chest pain, orthopnea and PND.  Gastrointestinal: Positive for abdominal pain. Negative for diarrhea, nausea and vomiting.  Genitourinary: Positive for dysuria. Negative for hematuria.  Neurological: Negative for dizziness, sensory change and focal weakness.  All other systems reviewed and are negative.   Nutrition: Regular Tolerating Diet: Yes Tolerating PT: Ambulatory  DRUG ALLERGIES:  No Known Allergies  VITALS:  Blood pressure (!) 93/53, pulse 72, temperature 98.3 F (36.8 C), temperature source Oral, resp. rate 12, height 6' (1.829 m), weight 64.4 kg (141 lb 14.4 oz), SpO2 92 %.  PHYSICAL EXAMINATION:   Physical Exam  GENERAL:  61 y.o.-year-old patient lying in bed in no acute distress.  EYES: Pupils equal, round, reactive to light and accommodation. No scleral icterus. Extraocular muscles intact.  HEENT: Head atraumatic, normocephalic. Oropharynx and nasopharynx clear.  NECK:  Supple, no jugular venous distention. No thyroid enlargement, no tenderness.  LUNGS: Normal breath sounds bilaterally, no wheezing, rales, rhonchi. No use of accessory muscles of respiration.  CARDIOVASCULAR: S1, S2 normal. No murmurs, rubs, or gallops.  ABDOMEN: Soft, Tender in right flank but no rebound, rigidity, nondistended. Bowel sounds present. No organomegaly or mass.  EXTREMITIES: No cyanosis, clubbing  or edema b/l.    NEUROLOGIC: Cranial nerves II through XII are intact. No focal Motor or sensory deficits b/l.   PSYCHIATRIC: The patient is alert and oriented x 3.  SKIN: No obvious rash, lesion, or ulcer.    LABORATORY PANEL:   CBC  Recent Labs Lab 08/21/16 0756  WBC 8.1  HGB 12.8*  HCT 37.0*  PLT 174   ------------------------------------------------------------------------------------------------------------------  Chemistries   Recent Labs Lab 08/21/16 0756 08/22/16 0441  NA 136 143  K 3.6 4.3  CL 105 111  CO2 21* 26  GLUCOSE 146* 99  BUN 31* 17  CREATININE 1.49* 0.93  CALCIUM 8.6* 8.5*  AST 30  --   ALT 21  --   ALKPHOS 67  --   BILITOT 0.8  --    ------------------------------------------------------------------------------------------------------------------  Cardiac Enzymes No results for input(s): TROPONINI in the last 168 hours. ------------------------------------------------------------------------------------------------------------------  RADIOLOGY:  Ct Abdomen Pelvis W Contrast  Result Date: 08/21/2016 CLINICAL DATA:  Right lower quadrant and right flank pain. EXAM: CT ABDOMEN AND PELVIS WITH CONTRAST TECHNIQUE: Multidetector CT imaging of the abdomen and pelvis was performed using the standard protocol following bolus administration of intravenous contrast. CONTRAST:  158mL ISOVUE-300 IOPAMIDOL (ISOVUE-300) INJECTION 61% COMPARISON:  06/30/2015 FINDINGS: Lower chest: No acute abnormality. Hepatobiliary: No focal hepatic abnormality. Gallbladder unremarkable. Pancreas: No focal abnormality or ductal dilatation. Spleen: No focal abnormality.  Normal size. Adrenals/Urinary Tract: Bilateral renal cysts. No hydronephrosis. Complex cystic area in the midpole of the right kidney on image 15 of delayed renal series (series 5). Adrenal glands and urinary bladder unremarkable. Stomach/Bowel: Normal appendix. Sigmoid diverticulosis. No active diverticulitis.  Stomach and small bowel decompressed. Vascular/Lymphatic: Diffuse aortic and iliac calcifications.  No aneurysm or adenopathy. IVC filter in place. The legs extend through the wall of the IVC, unchanged since prior study. Reproductive: Penile implant noted. Other: No free fluid or free air. Musculoskeletal: No acute bony abnormality. IMPRESSION: Complex cysts in the midpole of the right kidney. These warrant additional characterisation or follow-up. These could be further characterized with MRI with and without contrast or followed with repeat CT or ultrasound in 6 months. IVC filter legs extend through the wall of the IVC, stable since prior study. Aortoiliac atherosclerosis. Sigmoid diverticulosis. No acute findings. Electronically Signed   By: Rolm Baptise M.D.   On: 08/21/2016 09:26     ASSESSMENT AND PLAN:   61 year old male with past medical history of hypertension, hyperlipidemia, history of previous DVT/PE, restless leg syndrome, depression, anxiety who presented to the hospital due to right flank pain, noted to have a urinary tract infection.  1. Acute pyelonephritis-patient still complaining of some right flank pain. Continue supportive care with IV fluids, antiemetics, IV ceftriaxone. -Follow urine cultures. Slowly improved. CT scan showing no evidence of nephrolithiasis. It does show a complex cyst in the kidney which can be further worked up as an outpatient.   2. Renal cyst-this was incidentally noted on the CT scan of the abdomen and pelvis. Patient to have an outpatient CT scan or MRI in the next 6 months with follow-up.  3. Headache-patient complaining of some migraine type headache. -Given some IV Toradol. We'll place on some Fioricet.  4. Depression/anxiety-continue Klonopin, Lexapro.  5. Hyperlipidemia-continue Crestor.  6. Systemic syndrome-continue Requip.  7. COPD-no acute exacerbation. Continue Dulera.     All the records are reviewed and case discussed with Care  Management/Social Worker. Management plans discussed with the patient, family and they are in agreement.  CODE STATUS: Full  DVT Prophylaxis: Lovenox  TOTAL TIME TAKING CARE OF THIS PATIENT: 30 minutes.   POSSIBLE D/C IN 1-2 DAYS, DEPENDING ON CLINICAL CONDITION.   Henreitta Leber M.D on 08/22/2016 at 2:15 PM  Between 7am to 6pm - Pager - [phone]  After 6pm go to [email or web address] - Proofreader  [facility]  Office  [phone]  CC: Primary care physician; [name], MD

## 2016-08-23 LAB — HIV ANTIBODY (ROUTINE TESTING W REFLEX): HIV Screen 4th Generation wRfx: NONREACTIVE

## 2016-08-23 MED ORDER — CEFUROXIME AXETIL 250 MG PO TABS: 250.0000 mg | ORAL_TABLET | Freq: Two times a day (BID) | ORAL | 0 refills | Status: AC

## 2016-08-23 NOTE — Discharge Summary (Signed)
[place] [facility] [place] NAME: Gerald Jefferson    MR#:  [id number]  DATE OF BIRTH:  1955-07-26  DATE OF ADMISSION:  08/21/2016 ADMITTING PHYSICIAN: Harrel Lemon, MD  DATE OF DISCHARGE: 08/23/2016 12:05 PM  PRIMARY CARE PHYSICIAN: [name], MD    ADMISSION DIAGNOSIS:  Pyelonephritis [N12]  DISCHARGE DIAGNOSIS:  Active Problems:   Pyelonephritis   SECONDARY DIAGNOSIS:   Past Medical History:  Diagnosis Date  . Acid reflux   . Anxiety   . Arthritis   . Asthma   . Back pain    lower  . Coronary artery disease    a. remote PCI/DES to the LAD and OM in 2008; b. [facility] 2009 patent stents with mild RCA disease; c. lexiscan myoview 2018: intermediate risk with small sized, moderate in severity mild inferoseptal defect that may have represented artifact or scar. There was also a small sized, mild in severity reversible defect involving the mid and apical anterior segments seen only on attenuation corrected imaged   . Depression   . DVT (deep venous thrombosis) ([place]) 2008   a. DVT-leg and lung; b. complicated by GI bleed on triple therapy   . ED (erectile dysfunction)   . Essential hypertension   . GI bleed   . Hyperlipidemia   . Pulmonary embolism ([place])    a. complicated by GI on triple therapy  . RLS (restless legs syndrome)     HOSPITAL COURSE:   61 year old male with past medical history of hypertension, hyperlipidemia, history of previous DVT/PE, restless leg syndrome, depression, anxiety who presented to the hospital due to right flank pain, noted to have a urinary tract infection.  1. Acute pyelonephritis- this was the cause of pt's right sided flank pain.  -Patient was treated with IV fluids, antiemetics, IV ceftriaxone. His urine cultures although did not grow anything. He has clinically improved with treatment and his abdominal pain and no nausea vomiting and he is afebrile. He is being discharged on oral Ceftin.  2. Renal  cyst-this was incidentally noted on the CT scan of the abdomen and pelvis. Patient to have an outpatient CT scan or MRI in the next 6 months with follow-up.  3. Headache-patient complaining of some migraine type headache. -This improved with some Fioricet and Toradol. Patient was told to continue taking some Tylenol and Motrin as an outpatient as needed. If this persists he is to follow-up with his primary or neurology as an outpatient.  4. Depression/anxiety- he will continue Klonopin, Lexapro.  5. Hyperlipidemia- he will continue Crestor.  6. Systemic syndrome- he will continue Requip.  7. COPD-no acute exacerbation. He will Continue his Symbicort.  DISCHARGE CONDITIONS:   Stable.   CONSULTS OBTAINED:    DRUG ALLERGIES:  No Known Allergies  DISCHARGE MEDICATIONS:   Allergies as of 08/23/2016   No Known Allergies     Medication List    STOP taking these medications   nitrofurantoin (macrocrystal-monohydrate) 100 MG capsule Commonly known as:  MACROBID     TAKE these medications   aspirin EC 81 MG tablet Take 81 mg by mouth daily.   Azelastine HCl 0.15 % Soln 1 SPRAY/NARES DAILY   budesonide-formoterol 80-4.5 MCG/ACT inhaler Commonly known as:  SYMBICORT Inhale 2 puffs into the lungs 2 (two) times daily.   busPIRone 15 MG tablet Commonly known as:  BUSPAR Take 15 mg by mouth 2 (two) times daily.   cefUROXime 250 MG tablet Commonly known as:  CEFTIN  Take 1 tablet (250 mg total) by mouth 2 (two) times daily with a meal.   clonazePAM 1 MG tablet Commonly known as:  KLONOPIN Take 1 mg by mouth every morning.   cyanocobalamin 1000 MCG tablet Take 1 tablet (1,000 mcg total) by mouth daily.   dronabinol 5 MG capsule Commonly known as:  MARINOL Take 5 mg by mouth 2 (two) times daily.   escitalopram 10 MG tablet Commonly known as:  LEXAPRO Take 10 mg by mouth daily.   ferrous sulfate 325 (65 FE) MG tablet Take 325 mg by mouth daily.   gabapentin 600  MG tablet Commonly known as:  NEURONTIN Take 600 mg by mouth 3 (three) times daily.   HYDROcodone-acetaminophen 10-325 MG tablet Commonly known as:  NORCO Take 1 tablet by mouth every 6 (six) hours as needed. Takes qid daily   omeprazole 20 MG capsule Commonly known as:  PRILOSEC Take 1 capsule (20 mg total) by mouth daily.   phenazopyridine 100 MG tablet Commonly known as:  PYRIDIUM Take 100 mg by mouth 2 (two) times daily.   PREVIDENT 5000 BOOSTER PLUS 1.1 % Pste Generic drug:  Sodium Fluoride Take 1 application by mouth 2 (two) times daily.   promethazine 25 MG tablet Commonly known as:  PHENERGAN Take 25 mg by mouth every 6 (six) hours as needed for nausea or vomiting.   rOPINIRole 0.25 MG tablet Commonly known as:  REQUIP Take 0.25 mg by mouth at bedtime.   rosuvastatin 20 MG tablet Commonly known as:  CRESTOR Take 20 mg by mouth at bedtime.   tiZANidine 2 MG tablet Commonly known as:  ZANAFLEX Take 2 mg by mouth at bedtime.         DISCHARGE INSTRUCTIONS:   DIET:  Cardiac diet  DISCHARGE CONDITION:  Stable  ACTIVITY:  Activity as tolerated  OXYGEN:  Home Oxygen: No.   Oxygen Delivery: room air  DISCHARGE LOCATION:  home   If you experience worsening of your admission symptoms, develop shortness of breath, life threatening emergency, suicidal or homicidal thoughts you must seek medical attention immediately by calling 911 or calling your MD immediately  if symptoms less severe.  You Must read complete instructions/literature along with all the possible adverse reactions/side effects for all the Medicines you take and that have been prescribed to you. Take any new Medicines after you have completely understood and accpet all the possible adverse reactions/side effects.   Please note  You were cared for by a hospitalist during your hospital stay. If you have any questions about your discharge medications or the care you received while you were in  the hospital after you are discharged, you can call the unit and asked to speak with the hospitalist on call if the hospitalist that took care of you is not available. Once you are discharged, your primary care physician will handle any further medical issues. Please note that NO REFILLS for any discharge medications will be authorized once you are discharged, as it is imperative that you return to your primary care physician (or establish a relationship with a primary care physician if you do not have one) for your aftercare needs so that they can reassess your need for medications and monitor your lab values.     Today   Abdominal pain improved. No fever. No flank pain. Urine cultures showing insignificant growth. Still having a headache but improved since yesterday.  VITAL SIGNS:  Blood pressure (!) 136/91, pulse 63, temperature 98.3 F (36.8  C), resp. rate 18, height 6' (1.829 m), weight 64.4 kg (141 lb 14.4 oz), SpO2 96 %.  I/O:   Intake/Output Summary (Last 24 hours) at 08/23/16 1608 Last data filed at 08/23/16 0933  Gross per 24 hour  Intake          1442.18 ml  Output             1275 ml  Net           167.18 ml    PHYSICAL EXAMINATION:  GENERAL:  61 y.o.-year-old patient lying in the bed with no acute distress.  EYES: Pupils equal, round, reactive to light and accommodation. No scleral icterus. Extraocular muscles intact.  HEENT: Head atraumatic, normocephalic. Oropharynx and nasopharynx clear.  NECK:  Supple, no jugular venous distention. No thyroid enlargement, no tenderness.  LUNGS: Normal breath sounds bilaterally, no wheezing, rales,rhonchi. No use of accessory muscles of respiration.  CARDIOVASCULAR: S1, S2 normal. No murmurs, rubs, or gallops.  ABDOMEN: Soft, non-tender, non-distended. Bowel sounds present. No organomegaly or mass.  EXTREMITIES: No pedal edema, cyanosis, or clubbing.  NEUROLOGIC: Cranial nerves II through XII are intact. No focal motor or sensory  defecits b/l.  PSYCHIATRIC: The patient is alert and oriented x 3. SKIN: No obvious rash, lesion, or ulcer.   DATA REVIEW:   CBC  Recent Labs Lab 08/21/16 0756  WBC 8.1  HGB 12.8*  HCT 37.0*  PLT 174    Chemistries   Recent Labs Lab 08/21/16 0756 08/22/16 0441  NA 136 143  K 3.6 4.3  CL 105 111  CO2 21* 26  GLUCOSE 146* 99  BUN 31* 17  CREATININE 1.49* 0.93  CALCIUM 8.6* 8.5*  AST 30  --   ALT 21  --   ALKPHOS 67  --   BILITOT 0.8  --     Cardiac Enzymes No results for input(s): TROPONINI in the last 168 hours.  Microbiology Results  Results for orders placed or performed during the hospital encounter of 08/21/16  Urine Culture     Status: Abnormal   Collection Time: 08/21/16  7:58 AM  Result Value Ref Range Status   Specimen Description URINE, RANDOM  Final   Special Requests NONE  Final   Culture (A)  Final    <10,000 COLONIES/mL INSIGNIFICANT GROWTH Performed at [facility], [place] [address] [place], [place] [address]    Report Status 08/22/2016 FINAL  Final    RADIOLOGY:  No results found.    Management plans discussed with the patient, family and they are in agreement.    Advance Directive Documentation     Most Recent Value  Type of Advance Directive  Healthcare Power of Attorney  Pre-existing out of facility DNR order (yellow form or pink MOST form)  -  "MOST" Form in Place?  -      TOTAL TIME TAKING CARE OF THIS PATIENT: 40 minutes.    Henreitta Leber M.D on 08/23/2016 at 4:08 PM  Between 7am to 6pm - Pager - [phone]  After 6pm go to [email or web address] - Proofreader  [facility]  Office  [phone]  CC: Primary care physician; [name], MD

## 2016-08-23 NOTE — Progress Notes (Signed)
Patient discharged to home as ordered. Discharge instructions given as ordered. Follow up appointments given as ordered. IV discontinued, site clean dry and intact. Patient denies pain at this time no acute distress noted

## 2016-09-05 ENCOUNTER — Encounter: Payer: Self-pay | Admitting: Emergency Medicine

## 2016-09-05 ENCOUNTER — Inpatient Hospital Stay
Admission: EM | Admit: 2016-09-05 | Discharge: 2016-09-08 | DRG: 690 | Disposition: A | Discharge status: Home Health | Payer: BLUE CROSS/BLUE SHIELD | Attending: Internal Medicine | Admitting: Internal Medicine

## 2016-09-05 ENCOUNTER — Emergency Department: Payer: BLUE CROSS/BLUE SHIELD

## 2016-09-05 DIAGNOSIS — N12 Tubulo-interstitial nephritis, not specified as acute or chronic: Secondary | ICD-10-CM

## 2016-09-05 DIAGNOSIS — N179 Acute kidney failure, unspecified: Secondary | ICD-10-CM | POA: Diagnosis present

## 2016-09-05 DIAGNOSIS — G2581 Restless legs syndrome: Secondary | ICD-10-CM | POA: Diagnosis present

## 2016-09-05 DIAGNOSIS — M1990 Unspecified osteoarthritis, unspecified site: Secondary | ICD-10-CM | POA: Diagnosis present

## 2016-09-05 DIAGNOSIS — I2510 Atherosclerotic heart disease of native coronary artery without angina pectoris: Secondary | ICD-10-CM | POA: Diagnosis present

## 2016-09-05 DIAGNOSIS — Z87891 Personal history of nicotine dependence: Secondary | ICD-10-CM

## 2016-09-05 DIAGNOSIS — K219 Gastro-esophageal reflux disease without esophagitis: Secondary | ICD-10-CM | POA: Diagnosis present

## 2016-09-05 DIAGNOSIS — Z86718 Personal history of other venous thrombosis and embolism: Secondary | ICD-10-CM | POA: Diagnosis not present

## 2016-09-05 DIAGNOSIS — N390 Urinary tract infection, site not specified: Secondary | ICD-10-CM | POA: Diagnosis present

## 2016-09-05 DIAGNOSIS — F329 Major depressive disorder, single episode, unspecified: Secondary | ICD-10-CM | POA: Diagnosis present

## 2016-09-05 DIAGNOSIS — Z1612 Extended spectrum beta lactamase (ESBL) resistance: Secondary | ICD-10-CM | POA: Diagnosis present

## 2016-09-05 DIAGNOSIS — J449 Chronic obstructive pulmonary disease, unspecified: Secondary | ICD-10-CM | POA: Diagnosis present

## 2016-09-05 DIAGNOSIS — B9620 Unspecified Escherichia coli [E. coli] as the cause of diseases classified elsewhere: Secondary | ICD-10-CM | POA: Diagnosis present

## 2016-09-05 DIAGNOSIS — N281 Cyst of kidney, acquired: Secondary | ICD-10-CM

## 2016-09-05 DIAGNOSIS — E785 Hyperlipidemia, unspecified: Secondary | ICD-10-CM | POA: Diagnosis present

## 2016-09-05 DIAGNOSIS — Z955 Presence of coronary angioplasty implant and graft: Secondary | ICD-10-CM

## 2016-09-05 DIAGNOSIS — Z96642 Presence of left artificial hip joint: Secondary | ICD-10-CM | POA: Diagnosis present

## 2016-09-05 DIAGNOSIS — I10 Essential (primary) hypertension: Secondary | ICD-10-CM | POA: Diagnosis present

## 2016-09-05 DIAGNOSIS — F419 Anxiety disorder, unspecified: Secondary | ICD-10-CM | POA: Diagnosis present

## 2016-09-05 DIAGNOSIS — Z7982 Long term (current) use of aspirin: Secondary | ICD-10-CM

## 2016-09-05 LAB — URINALYSIS, COMPLETE (UACMP) WITH MICROSCOPIC
Bilirubin Urine: NEGATIVE
GLUCOSE, UA: NEGATIVE mg/dL
HGB URINE DIPSTICK: NEGATIVE
Ketones, ur: NEGATIVE mg/dL
Nitrite: NEGATIVE
PH: 5 (ref 5.0–8.0)
Protein, ur: 100 mg/dL — AB
SPECIFIC GRAVITY, URINE: 1.016 (ref 1.005–1.030)

## 2016-09-05 LAB — COMPREHENSIVE METABOLIC PANEL
ALBUMIN: 3.5 g/dL (ref 3.5–5.0)
ALT: 13 U/L — AB (ref 17–63)
AST: 15 U/L (ref 15–41)
Alkaline Phosphatase: 73 U/L (ref 38–126)
Anion gap: 6 (ref 5–15)
BUN: 20 mg/dL (ref 6–20)
CHLORIDE: 105 mmol/L (ref 101–111)
CO2: 26 mmol/L (ref 22–32)
CREATININE: 1.38 mg/dL — AB (ref 0.61–1.24)
Calcium: 9 mg/dL (ref 8.9–10.3)
GFR calc Af Amer: >60 mL/min (ref >60)
GFR, EST NON AFRICAN AMERICAN: 53 mL/min — AB (ref >60)
GLUCOSE: 180 mg/dL — AB (ref 65–99)
Potassium: 4.9 mmol/L (ref 3.5–5.1)
Sodium: 137 mmol/L (ref 135–145)
Total Bilirubin: 0.8 mg/dL (ref 0.3–1.2)
Total Protein: 7.2 g/dL (ref 6.5–8.1)

## 2016-09-05 LAB — CBC
HCT: 35.2 % — ABNORMAL LOW (ref 40.0–52.0)
HEMOGLOBIN: 11.8 g/dL — AB (ref 13.0–18.0)
MCH: 31 pg (ref 26.0–34.0)
MCHC: 33.5 g/dL (ref 32.0–36.0)
MCV: 92.4 fL (ref 80.0–100.0)
PLATELETS: 297 10*3/uL (ref 150–440)
RBC: 3.81 MIL/uL — AB (ref 4.40–5.90)
RDW: 13.7 % (ref 11.5–14.5)
WBC: 8.7 10*3/uL (ref 3.8–10.6)

## 2016-09-05 LAB — TROPONIN I

## 2016-09-05 MED ORDER — HYDROCODONE-ACETAMINOPHEN 5-325 MG PO TABS: 1.0000 | ORAL_TABLET | ORAL | Status: DC | PRN

## 2016-09-05 MED ORDER — PANTOPRAZOLE SODIUM 40 MG PO TBEC
40.0000 mg | DELAYED_RELEASE_TABLET | Freq: Every day | ORAL | Status: DC
  Administered 2016-09-06 – 2016-09-08 (×3): 40 mg via ORAL
  Filled 2016-09-05 (×3): qty 1

## 2016-09-05 MED ORDER — VITAMIN B-12 1000 MCG PO TABS
1000.0000 ug | ORAL_TABLET | Freq: Every day | ORAL | Status: DC
  Administered 2016-09-06 – 2016-09-08 (×3): 1000 ug via ORAL
  Filled 2016-09-05 (×3): qty 1

## 2016-09-05 MED ORDER — DRONABINOL 2.5 MG PO CAPS
5.0000 mg | ORAL_CAPSULE | Freq: Two times a day (BID) | ORAL | Status: DC
  Administered 2016-09-05 – 2016-09-08 (×6): 5 mg via ORAL
  Filled 2016-09-05 (×6): qty 2

## 2016-09-05 MED ORDER — ACETAMINOPHEN 650 MG RE SUPP: 650.0000 mg | Freq: Four times a day (QID) | RECTAL | Status: DC | PRN

## 2016-09-05 MED ORDER — DEXTROSE 5 % IV SOLN
1.0000 g | INTRAVENOUS | Status: DC
  Administered 2016-09-06: 1 g via INTRAVENOUS
  Filled 2016-09-05 (×2): qty 10

## 2016-09-05 MED ORDER — BISACODYL 5 MG PO TBEC: 5.0000 mg | DELAYED_RELEASE_TABLET | Freq: Every day | ORAL | Status: DC | PRN

## 2016-09-05 MED ORDER — HYDROMORPHONE HCL 1 MG/ML IJ SOLN
0.5000 mg | Freq: Once | INTRAMUSCULAR | Status: AC
  Administered 2016-09-05: 0.5 mg via INTRAVENOUS
  Filled 2016-09-05: qty 1

## 2016-09-05 MED ORDER — FERROUS SULFATE 325 (65 FE) MG PO TABS
325.0000 mg | ORAL_TABLET | Freq: Every day | ORAL | Status: DC
  Administered 2016-09-06 – 2016-09-08 (×3): 325 mg via ORAL
  Filled 2016-09-05 (×3): qty 1

## 2016-09-05 MED ORDER — KETOROLAC TROMETHAMINE 30 MG/ML IJ SOLN: 30.0000 mg | Freq: Four times a day (QID) | INTRAMUSCULAR | Status: DC | PRN

## 2016-09-05 MED ORDER — ONDANSETRON HCL 4 MG/2ML IJ SOLN: 4.0000 mg | Freq: Four times a day (QID) | INTRAMUSCULAR | Status: DC | PRN

## 2016-09-05 MED ORDER — HYDROCODONE-ACETAMINOPHEN 10-325 MG PO TABS
1.0000 | ORAL_TABLET | Freq: Four times a day (QID) | ORAL | Status: DC
  Administered 2016-09-05 – 2016-09-08 (×12): 1 via ORAL
  Filled 2016-09-05 (×12): qty 1

## 2016-09-05 MED ORDER — GABAPENTIN 600 MG PO TABS
600.0000 mg | ORAL_TABLET | Freq: Three times a day (TID) | ORAL | Status: DC
  Administered 2016-09-05 – 2016-09-08 (×9): 600 mg via ORAL
  Filled 2016-09-05 (×9): qty 1

## 2016-09-05 MED ORDER — ALBUTEROL SULFATE (2.5 MG/3ML) 0.083% IN NEBU: 2.5000 mg | INHALATION_SOLUTION | RESPIRATORY_TRACT | Status: DC | PRN

## 2016-09-05 MED ORDER — ROSUVASTATIN CALCIUM 10 MG PO TABS
20.0000 mg | ORAL_TABLET | Freq: Every day | ORAL | Status: DC
  Administered 2016-09-05 – 2016-09-07 (×3): 20 mg via ORAL
  Filled 2016-09-05 (×3): qty 2

## 2016-09-05 MED ORDER — ROPINIROLE HCL 0.25 MG PO TABS
0.2500 mg | ORAL_TABLET | Freq: Every day | ORAL | Status: DC
  Administered 2016-09-05 – 2016-09-07 (×3): 0.25 mg via ORAL
  Filled 2016-09-05 (×4): qty 1

## 2016-09-05 MED ORDER — PHENAZOPYRIDINE HCL 100 MG PO TABS
100.0000 mg | ORAL_TABLET | Freq: Two times a day (BID) | ORAL | Status: DC
  Administered 2016-09-05 – 2016-09-08 (×6): 100 mg via ORAL
  Filled 2016-09-05 (×7): qty 1

## 2016-09-05 MED ORDER — BUTALBITAL-APAP-CAFFEINE 50-325-40 MG PO TABS
1.0000 | ORAL_TABLET | Freq: Four times a day (QID) | ORAL | Status: DC | PRN
  Administered 2016-09-05 – 2016-09-08 (×9): 1 via ORAL
  Filled 2016-09-05 (×10): qty 1

## 2016-09-05 MED ORDER — ESCITALOPRAM OXALATE 10 MG PO TABS
10.0000 mg | ORAL_TABLET | Freq: Every day | ORAL | Status: DC
  Administered 2016-09-05 – 2016-09-08 (×4): 10 mg via ORAL
  Filled 2016-09-05 (×4): qty 1

## 2016-09-05 MED ORDER — TIZANIDINE HCL 2 MG PO TABS
2.0000 mg | ORAL_TABLET | Freq: Every day | ORAL | Status: DC
  Administered 2016-09-05 – 2016-09-07 (×3): 2 mg via ORAL
  Filled 2016-09-05 (×4): qty 1

## 2016-09-05 MED ORDER — CLONAZEPAM 1 MG PO TABS
1.0000 mg | ORAL_TABLET | Freq: Every morning | ORAL | Status: DC
  Administered 2016-09-06 – 2016-09-08 (×3): 1 mg via ORAL
  Filled 2016-09-05 (×3): qty 1

## 2016-09-05 MED ORDER — AZELASTINE HCL 0.15 % NA SOLN
1.0000 | Freq: Every day | NASAL | Status: DC
Start: 2016-09-05 — End: 2016-09-08
  Administered 2016-09-06 – 2016-09-08 (×3): 1 via NASAL
  Filled 2016-09-05 (×2): qty 30

## 2016-09-05 MED ORDER — ASPIRIN EC 81 MG PO TBEC
81.0000 mg | DELAYED_RELEASE_TABLET | Freq: Every day | ORAL | Status: DC
  Administered 2016-09-06 – 2016-09-08 (×3): 81 mg via ORAL
  Filled 2016-09-05 (×3): qty 1

## 2016-09-05 MED ORDER — ONDANSETRON HCL 4 MG PO TABS: 4.0000 mg | ORAL_TABLET | Freq: Four times a day (QID) | ORAL | Status: DC | PRN

## 2016-09-05 MED ORDER — MOMETASONE FURO-FORMOTEROL FUM 100-5 MCG/ACT IN AERO
2.0000 | INHALATION_SPRAY | Freq: Two times a day (BID) | RESPIRATORY_TRACT | Status: DC
  Administered 2016-09-05 – 2016-09-08 (×6): 2 via RESPIRATORY_TRACT
  Filled 2016-09-05: qty 8.8

## 2016-09-05 MED ORDER — BUSPIRONE HCL 10 MG PO TABS
15.0000 mg | ORAL_TABLET | Freq: Two times a day (BID) | ORAL | Status: DC
  Administered 2016-09-05 – 2016-09-08 (×6): 15 mg via ORAL
  Filled 2016-09-05: qty 1.5
  Filled 2016-09-05: qty 1
  Filled 2016-09-05 (×2): qty 1.5
  Filled 2016-09-05: qty 1
  Filled 2016-09-05: qty 1.5
  Filled 2016-09-05: qty 1

## 2016-09-05 MED ORDER — HEPARIN SODIUM (PORCINE) 5000 UNIT/ML IJ SOLN
5000.0000 [IU] | Freq: Three times a day (TID) | INTRAMUSCULAR | Status: DC
  Administered 2016-09-05 – 2016-09-08 (×8): 5000 [IU] via SUBCUTANEOUS
  Filled 2016-09-05 (×8): qty 1

## 2016-09-05 MED ORDER — SODIUM FLUORIDE 1.1 % DT PSTE
1.0000 "application " | PASTE | Freq: Two times a day (BID) | DENTAL | Status: DC
  Administered 2016-09-05: 1 via ORAL
  Filled 2016-09-05 (×3): qty 100

## 2016-09-05 MED ORDER — PROMETHAZINE HCL 25 MG PO TABS
25.0000 mg | ORAL_TABLET | Freq: Four times a day (QID) | ORAL | Status: DC | PRN
  Filled 2016-09-05: qty 1

## 2016-09-05 MED ORDER — DEXTROSE 5 % IV SOLN
2.0000 g | Freq: Once | INTRAVENOUS | Status: AC
  Administered 2016-09-05: 2 g via INTRAVENOUS
  Filled 2016-09-05: qty 2

## 2016-09-05 MED ORDER — ACETAMINOPHEN 325 MG PO TABS
650.0000 mg | ORAL_TABLET | Freq: Four times a day (QID) | ORAL | Status: DC | PRN
  Administered 2016-09-05: 650 mg via ORAL
  Filled 2016-09-05: qty 2

## 2016-09-05 MED ORDER — SODIUM CHLORIDE 0.9 % IV SOLN
Freq: Once | INTRAVENOUS | Status: AC
  Administered 2016-09-05: 15:00:00 via INTRAVENOUS

## 2016-09-05 MED ORDER — HYDROMORPHONE HCL 1 MG/ML IJ SOLN
0.5000 mg | Freq: Once | INTRAMUSCULAR | Status: AC
Start: 2016-09-05 — End: 2016-09-05
  Administered 2016-09-05: 0.5 mg via INTRAVENOUS
  Filled 2016-09-05: qty 1

## 2016-09-05 MED ORDER — SENNOSIDES-DOCUSATE SODIUM 8.6-50 MG PO TABS: 1.0000 | ORAL_TABLET | Freq: Every evening | ORAL | Status: DC | PRN

## 2016-09-05 NOTE — ED Triage Notes (Signed)
Pt reports has not felt well since discharge 08/23/16. Pt reports recently had stents placed in his heart and has been told his liver counts are up by PCP. Pt reports bilateral flank pain, generalized weakness and headache.

## 2016-09-05 NOTE — Progress Notes (Signed)
Patient c/o headache and states previous admissino he was prescribed medication specifically for headache. On call MD paged for order

## 2016-09-05 NOTE — ED Provider Notes (Signed)
[facility] Emergency Department Provider Note       Time seen: ----------------------------------------- 2:32 PM on 09/05/2016 -----------------------------------------     I have reviewed the triage vital signs and the nursing notes.   HISTORY   Chief Complaint Flank Pain; Shortness of Breath; and Weakness    HPI Gerald Jefferson is a 62 y.o. male who presents to the ED for general ill feeling and flank pain since his last hospitalization here 2 weeks ago. Patient reports weakness, headache, chills and persistent flank pain. Patient was admitted for pyelonephritis 2 weeks ago with no history of same. He denies a fever or vomiting.   Past Medical History:  Diagnosis Date  . Acid reflux   . Anxiety   . Arthritis   . Asthma   . Back pain    lower  . Coronary artery disease    a. remote PCI/DES to the LAD and OM in 2008; b. [facility] 2009 patent stents with mild RCA disease; c. lexiscan myoview 2018: intermediate risk with small sized, moderate in severity mild inferoseptal defect that may have represented artifact or scar. There was also a small sized, mild in severity reversible defect involving the mid and apical anterior segments seen only on attenuation corrected imaged   . Depression   . DVT (deep venous thrombosis) ([place]) 2008   a. DVT-leg and lung; b. complicated by GI bleed on triple therapy   . ED (erectile dysfunction)   . Essential hypertension   . GI bleed   . Hyperlipidemia   . Pulmonary embolism ([place])    a. complicated by GI on triple therapy  . RLS (restless legs syndrome)     Patient Active Problem List   Diagnosis Date Noted  . Pyelonephritis 08/21/2016  . Left sided numbness 02/29/2016  . Chest pressure 02/29/2016  . History of pulmonary embolism 02/29/2016  . S/P IVC filter 02/29/2016  . Chest pain with moderate risk for cardiac etiology   . Numbness and tingling of left side of face   . TIA (transient ischemic attack)   .  Erectile dysfunction 05/20/2015  . Myositis 10/27/2014  . Myofasciitis 10/27/2014  . Renal insufficiency 10/27/2014  . Leukocytosis 10/27/2014  . Polyneuropathy 08/26/2014  . Hip pain 03/12/2012  . Coronary artery disease   . Hyperlipidemia   . Essential hypertension   . ED (erectile dysfunction)     Past Surgical History:  Procedure Laterality Date  . CARDIAC CATHETERIZATION  07/2007   Patent LAD/OM stents. Stable 40% stenosis in proximal RCA  . CORONARY ANGIOPLASTY WITH STENT PLACEMENT  04/2006   [place] DES to LAD and OM1  . IVC FILTER PLACEMENT ([facility] HX)    . JOINT REPLACEMENT    . NOSE SURGERY    . PENILE PROSTHESIS IMPLANT N/A 05/20/2015   Procedure: PENILE PROTHESIS INFLATABLE;  Surgeon: Nickie Retort, MD;  Location: [facility] ORS;  Service: Urology;  Laterality: N/A;  . PENILE PROSTHESIS IMPLANT N/A 10/29/2015   Procedure: REVISION PENILE PROTHESIS;  Surgeon: Nickie Retort, MD;  Location: [facility] ORS;  Service: Urology;  Laterality: N/A;  . PERIPHERAL VASCULAR CATHETERIZATION N/A 07/08/2015   unsuccessful, was not removed, only attempted Procedure: IVC Filter Removal;  Surgeon: Algernon Huxley, MD;  Location: [facility];  Service: Cardiovascular;  Laterality: N/A;  . TOTAL HIP ARTHROPLASTY Left     Allergies Patient has no known allergies.  Social History Social History  Substance Use Topics  . Smoking status: Former Smoker  Packs/day: 1.50    Years: 30.00    Types: Cigarettes    Quit date: 04/08/2006  . Smokeless tobacco: Never Used  . Alcohol use No    Review of Systems Constitutional: Negative for fever.Positive for chills Eyes: Negative for vision changes ENT:  Negative for congestion, sore throat Cardiovascular: Negative for chest pain. Respiratory: Negative for shortness of breath. Gastrointestinal: Positive for flank pain Genitourinary: Negative for dysuria. Musculoskeletal: Negative for back pain. Skin: Negative for rash. Neurological:  Positive for headache, weakness  All systems negative/normal/unremarkable except as stated in the HPI  ____________________________________________   PHYSICAL EXAM:  VITAL SIGNS: ED Triage Vitals  Enc Vitals Group     BP 09/05/16 1118 110/66     Pulse Rate 09/05/16 1118 63     Resp 09/05/16 1118 16     Temp 09/05/16 1118 97.7 F (36.5 C)     Temp Source 09/05/16 1118 Oral     SpO2 09/05/16 1118 99 %     Weight 09/05/16 1118 143 lb (64.9 kg)     Height 09/05/16 1118 6' (1.829 m)     Head Circumference --      Peak Flow --      Pain Score 09/05/16 1129 6     Pain Loc --      Pain Edu? --      Excl. in [place]? --     Constitutional: Alert and oriented. Well appearing and in no distress. Eyes: Conjunctivae are normal. Normal extraocular movements. ENT   Head: Normocephalic and atraumatic.   Nose: No congestion/rhinnorhea.   Mouth/Throat: Mucous membranes are moist.   Neck: No stridor. Cardiovascular: Normal rate, regular rhythm. No murmurs, rubs, or gallops. Respiratory: Normal respiratory effort without tachypnea nor retractions. Breath sounds are clear and equal bilaterally. No wheezes/rales/rhonchi. Gastrointestinal: Soft and nontender. Normal bowel sounds. Bilateral CVA tenderness on examination Musculoskeletal: Nontender with normal range of motion in extremities. No lower extremity tenderness nor edema. Neurologic:  Normal speech and language. No gross focal neurologic deficits are appreciated.  Skin:  Skin is warm, dry and intact. No rash noted. Psychiatric: Mood and affect are normal. Speech and behavior are normal.  ____________________________________________  ED COURSE:  Pertinent labs & imaging results that were available during my care of the patient were reviewed by me and considered in my medical decision making (see chart for details). Patient presents for flank pain, we will assess with labs and imaging as indicated.    Procedures ____________________________________________   LABS (pertinent positives/negatives)  Labs Reviewed  CBC - Abnormal; Notable for the following:       Result Value   RBC 3.81 (*)    Hemoglobin 11.8 (*)    HCT 35.2 (*)    All other components within normal limits  URINALYSIS, COMPLETE (UACMP) WITH MICROSCOPIC - Abnormal; Notable for the following:    Color, Urine AMBER (*)    APPearance TURBID (*)    Protein, ur 100 (*)    Leukocytes, UA MODERATE (*)    Bacteria, UA MANY (*)    Squamous Epithelial / LPF 0-5 (*)    All other components within normal limits  COMPREHENSIVE METABOLIC PANEL - Abnormal; Notable for the following:    Glucose, Bld 180 (*)    Creatinine, Ser 1.38 (*)    ALT 13 (*)    GFR calc non Af Amer 53 (*)    All other components within normal limits  URINE CULTURE  TROPONIN I    RADIOLOGY  Images were viewed by me  Renal ultrasound IMPRESSION: 1. 2.1 cm complex cyst in the midpole of the right kidney, corresponding to lesion seen on recent CT. Further characterization with non-emergent contrast-enhanced MRI is recommended. 2. No hydronephrosis. ____________________________________________  FINAL ASSESSMENT AND PLAN  Pyelonephritis  Plan: Patient's labs and imaging were dictated above. Patient had presented for recurrent flank pain after recent hospitalization for pyelonephritis. He does have evidence of cystitis and clinically likely has recurrent pyelonephritis. He was given IV antibiotics as well as IV fluid for mild dehydration. I am concerned that this is the second event in 2 weeks.Patient states she does not feel well enough to go home, I will discuss with the hospitalist for observation and possibly urology consult.   Earleen Newport, MD   Note: This note was generated in part or whole with voice recognition software. Voice recognition is usually quite accurate but there are transcription errors that can and very often do occur.  I apologize for any typographical errors that were not detected and corrected.     Earleen Newport, MD 09/05/16 [phone]

## 2016-09-05 NOTE — Progress Notes (Signed)
Called and spoke to Dr. Posey Pronto about the patients pain medication order. Patient normally takes Norco 10/325 four times a day.  He gave me a verbal order for Norco 10/325 and to discontinue to PRN order.

## 2016-09-05 NOTE — H&P (Signed)
[place] at [place] NAME: Gerald Jefferson    MR#:  [id number]  DATE OF BIRTH:  May 28, 1955  DATE OF ADMISSION:  09/05/2016  PRIMARY CARE PHYSICIAN: [name], MD   REQUESTING/REFERRING PHYSICIAN: Earleen Newport, MD  CHIEF COMPLAINT:   Chief Complaint  Patient presents with  . Flank Pain  . Shortness of Breath  . Weakness   Flank pain and weakness. HISTORY OF PRESENT ILLNESS:  Gerald Jefferson  is a 61 y.o. male with a known history of Pyelonephritis 2 weeks ago, CAD, asthma, DVT, PE, GI bleeding, hypertension hyperlipidemia and back pain. The patient was treated with antibiotics during hospitalization 2 weeks ago. He was discharged with 4 days by mouth antibiotics. But he complains of persistent flank pain, chills, nausea and weakness for the past 2 weeks. Urinalysis show too numerous WBC. Renal ultrasound showed no hydronephrosis but 2.1 cm complex cyst in the midpole of the right kidney. He is treated with Rocephin in the ED. PAST MEDICAL HISTORY:   Past Medical History:  Diagnosis Date  . Acid reflux   . Anxiety   . Arthritis   . Asthma   . Back pain    lower  . Coronary artery disease    a. remote PCI/DES to the LAD and OM in 2008; b. [facility] 2009 patent stents with mild RCA disease; c. lexiscan myoview 2018: intermediate risk with small sized, moderate in severity mild inferoseptal defect that may have represented artifact or scar. There was also a small sized, mild in severity reversible defect involving the mid and apical anterior segments seen only on attenuation corrected imaged   . Depression   . DVT (deep venous thrombosis) ([place]) 2008   a. DVT-leg and lung; b. complicated by GI bleed on triple therapy   . ED (erectile dysfunction)   . Essential hypertension   . GI bleed   . Hyperlipidemia   . Pulmonary embolism ([place])    a. complicated by GI on triple therapy  . RLS (restless legs syndrome)     PAST SURGICAL  HISTORY:   Past Surgical History:  Procedure Laterality Date  . CARDIAC CATHETERIZATION  07/2007   Patent LAD/OM stents. Stable 40% stenosis in proximal RCA  . CORONARY ANGIOPLASTY WITH STENT PLACEMENT  04/2006   [place]ypher DES to LAD and OM1  . IVC FILTER PLACEMENT ([facility] HX)    . JOINT REPLACEMENT    . NOSE SURGERY    . PENILE PROSTHESIS IMPLANT N/A 05/20/2015   Procedure: PENILE PROTHESIS INFLATABLE;  Surgeon: Nickie Retort, MD;  Location: [facility] ORS;  Service: Urology;  Laterality: N/A;  . PENILE PROSTHESIS IMPLANT N/A 10/29/2015   Procedure: REVISION PENILE PROTHESIS;  Surgeon: Nickie Retort, MD;  Location: [facility] ORS;  Service: Urology;  Laterality: N/A;  . PERIPHERAL VASCULAR CATHETERIZATION N/A 07/08/2015   unsuccessful, was not removed, only attempted Procedure: IVC Filter Removal;  Surgeon: Algernon Huxley, MD;  Location: [place] [facility];  Service: Cardiovascular;  Laterality: N/A;  . TOTAL HIP ARTHROPLASTY Left     SOCIAL HISTORY:   Social History  Substance Use Topics  . Smoking status: Former Smoker    Packs/day: 1.50    Years: 30.00    Types: Cigarettes    Quit date: 04/08/2006  . Smokeless tobacco: Never Used  . Alcohol use No    FAMILY HISTORY:   Family History  Problem Relation Age of Onset  . Hypertension Mother   .  Kidney cancer Neg Hx   . Kidney disease Neg Hx   . Prostate cancer Neg Hx     DRUG ALLERGIES:  No Known Allergies  REVIEW OF SYSTEMS:   Review of Systems  Constitutional: Positive for chills and malaise/fatigue. Negative for fever.  HENT: Negative for sore throat.   Eyes: Negative for blurred vision and double vision.  Respiratory: Negative for cough, hemoptysis, shortness of breath, wheezing and stridor.   Cardiovascular: Negative for chest pain, palpitations, orthopnea and leg swelling.  Gastrointestinal: Negative for abdominal pain, blood in stool, diarrhea, melena, nausea and vomiting.  Genitourinary: Positive for flank pain.  Negative for dysuria and hematuria.  Musculoskeletal: Negative for back pain and joint pain.  Neurological: Positive for headaches. Negative for dizziness, sensory change, focal weakness, seizures, loss of consciousness and weakness.  Endo/Heme/Allergies: Negative for polydipsia.  Psychiatric/Behavioral: Negative for depression. The patient is not nervous/anxious.     MEDICATIONS AT HOME:   Prior to Admission medications   Medication Sig Start Date End Date Taking? Authorizing Provider  aspirin EC 81 MG tablet Take 81 mg by mouth daily.   Yes [provider]  Azelastine HCl 0.15 % SOLN 1 SPRAY/NARES DAILY 03/18/15  Yes [provider]  budesonide-formoterol (SYMBICORT) 80-4.5 MCG/ACT inhaler Inhale 2 puffs into the lungs 2 (two) times daily.   Yes [provider]  busPIRone (BUSPAR) 15 MG tablet Take 15 mg by mouth 2 (two) times daily.  04/13/15  Yes [provider]  clonazePAM (KLONOPIN) 1 MG tablet Take 1 mg by mouth every morning. 07/23/16  Yes [provider]  dronabinol (MARINOL) 5 MG capsule Take 5 mg by mouth 2 (two) times daily. 07/18/16  Yes [provider]  escitalopram (LEXAPRO) 10 MG tablet Take 10 mg by mouth daily. 08/09/16  Yes [provider]  ferrous sulfate 325 (65 FE) MG tablet Take 325 mg by mouth daily.    Yes [provider]  gabapentin (NEURONTIN) 600 MG tablet Take 600 mg by mouth 3 (three) times daily.   Yes [provider]  omeprazole (PRILOSEC) 20 MG capsule Take 1 capsule (20 mg total) by mouth daily. 08/29/11  Yes Wellington Hampshire, MD  phenazopyridine (PYRIDIUM) 100 MG tablet Take 100 mg by mouth 2 (two) times daily. 08/18/16  Yes [provider]  PREVIDENT 5000 BOOSTER PLUS 1.1 % PSTE Take 1 application by mouth 2 (two) times daily. 08/11/16  Yes [provider]  rOPINIRole (REQUIP) 0.25 MG tablet Take 0.25 mg by mouth at bedtime.   Yes [provider]  rosuvastatin  (CRESTOR) 20 MG tablet Take 20 mg by mouth at bedtime.    Yes [provider]  tiZANidine (ZANAFLEX) 2 MG tablet Take 2 mg by mouth at bedtime.     Yes [provider]  vitamin B-12 1000 MCG tablet Take 1 tablet (1,000 mcg total) by mouth daily. 10/30/14  Yes [name], MD  HYDROcodone-acetaminophen (NORCO) 10-325 MG tablet Take 1 tablet by mouth every 6 (six) hours as needed. Takes qid daily    [provider]  promethazine (PHENERGAN) 25 MG tablet Take 25 mg by mouth every 6 (six) hours as needed for nausea or vomiting.    [provider]      VITAL SIGNS:  Blood pressure 127/75, pulse 66, temperature 97.7 F (36.5 C), temperature source Oral, resp. rate 18, height 6' (1.829 m), weight 143 lb (64.9 kg), SpO2 96 %.  PHYSICAL EXAMINATION:  Physical Exam  GENERAL:  61 y.o.-year-old patient lying in the bed with no acute distress.  EYES: Pupils equal, round, reactive to light and accommodation. No scleral icterus. Extraocular muscles intact.  HEENT: Head atraumatic, normocephalic. Oropharynx and nasopharynx clear.  NECK:  Supple, no jugular venous distention. No thyroid enlargement, no tenderness.  LUNGS: Normal breath sounds bilaterally, no wheezing, rales,rhonchi or crepitation. No use of accessory muscles of respiration.  CARDIOVASCULAR: S1, S2 normal. No murmurs, rubs, or gallops.  ABDOMEN: Soft, mild epigastric area tenderness, nondistended. Bowel sounds present. No organomegaly or mass.  EXTREMITIES: No pedal edema, cyanosis, or clubbing.  NEUROLOGIC: Cranial nerves II through XII are intact. Muscle strength 5/5 in all extremities. Sensation intact. Gait not checked.  PSYCHIATRIC: The patient is alert and oriented x 3.  SKIN: No obvious rash, lesion, or ulcer.   LABORATORY PANEL:   CBC  Recent Labs Lab 09/05/16 1133  WBC 8.7  HGB 11.8*  HCT 35.2*  PLT 297    ------------------------------------------------------------------------------------------------------------------  Chemistries   Recent Labs Lab 09/05/16 1133  NA 137  K 4.9  CL 105  CO2 26  GLUCOSE 180*  BUN 20  CREATININE 1.38*  CALCIUM 9.0  AST 15  ALT 13*  ALKPHOS 73  BILITOT 0.8   ------------------------------------------------------------------------------------------------------------------  Cardiac Enzymes  Recent Labs Lab 09/05/16 1133  TROPONINI <0.03   ------------------------------------------------------------------------------------------------------------------  RADIOLOGY:  US Renal  Result Date: 09/05/2016 CLINICAL DATA:  Persistent flank pain. EXAM: RENAL / URINARY TRACT ULTRASOUND COMPLETE COMPARISON:  CT abdomen and pelvis dated August 21, 2016. FINDINGS: Right Kidney: Length: 10.9 cm. Echogenicity within normal limits. There is a 2.1 x 1.9 x 2.0 cm complex cyst in the midpole of the right kidney, corresponding to the lesion seen on recent CT. Additional 1.2 cm simple cyst visualized in the lower pole. No hydronephrosis visualized. Left Kidney: Length: 11.2 cm. Echogenicity within normal limits. There is a 1.4 cm simple cyst in the midpole. No mass or hydronephrosis visualized. Bladder: Appears normal for degree of bladder distention. Partially visualized penile implant reservoir. IMPRESSION: 1. 2.1 cm complex cyst in the midpole of the right kidney, corresponding to lesion seen on recent CT. Further characterization with non-emergent contrast-enhanced MRI is recommended. 2. No hydronephrosis. Electronically Signed   By: Titus Dubin M.D.   On: 09/05/2016 15:43      IMPRESSION AND PLAN:   Acute pyelonephritis The patient will be admitted to medical floor. Continue Rocephin IV, follow-up  urine culture. Pain control and Zofran when necessary. MRI of kidney.  2.1 cm complex cyst in the midpole of the right kidney. MRI of kidney.  Acute renal  failure. Start normal saline IV and follow-up BMP. CAD. Continue aspirin and Crestor. Asthma. Stable. Nebulizer when necessary.  All the records are reviewed and case discussed with ED provider. Management plans discussed with the patient, his wife and they are in agreement.  CODE STATUS: Full code  TOTAL TIME TAKING CARE OF THIS PATIENT:55 minutes.    Demetrios Loll M.D on 09/05/2016 at 4:30 PM  Between 7am to 6pm - Pager - [phone]  After 6pm go to [email or web address] - Proofreader  [facility]  Office  [phone]  CC: Primary care physician; [name], MD   Note: This dictation was prepared with Dragon dictation along with smaller phrase technology. Any transcriptional errors that result from this process are unintentional.

## 2016-09-06 ENCOUNTER — Inpatient Hospital Stay: Payer: BLUE CROSS/BLUE SHIELD

## 2016-09-06 LAB — BASIC METABOLIC PANEL
ANION GAP: 6 (ref 5–15)
BUN: 18 mg/dL (ref 6–20)
CALCIUM: 8.6 mg/dL — AB (ref 8.9–10.3)
CO2: 27 mmol/L (ref 22–32)
Chloride: 109 mmol/L (ref 101–111)
Creatinine, Ser: 1 mg/dL (ref 0.61–1.24)
GFR calc Af Amer: >60 mL/min (ref >60)
GLUCOSE: 94 mg/dL (ref 65–99)
Potassium: 4.3 mmol/L (ref 3.5–5.1)
SODIUM: 142 mmol/L (ref 135–145)

## 2016-09-06 MED ORDER — GADOBENATE DIMEGLUMINE 529 MG/ML IV SOLN
15.0000 mL | Freq: Once | INTRAVENOUS | Status: AC | PRN
  Administered 2016-09-06: 13 mL via INTRAVENOUS

## 2016-09-06 NOTE — Progress Notes (Signed)
[place] at [place] NAME: Gerald Jefferson    MR#:  [id number]  DATE OF BIRTH:  1955-03-24  SUBJECTIVE:   Patient here due to right flank pain and a positive urinalysis suspected to have acute pyelonephritis. He was recently hospitalized 2 weeks ago for similar reasons and discharged on oral antibiotics. Still having some right flank pain but no hematuria.  REVIEW OF SYSTEMS:    Review of Systems  Constitutional: Negative for chills and fever.  HENT: Negative for congestion and tinnitus.   Eyes: Negative for blurred vision and double vision.  Respiratory: Negative for cough, shortness of breath and wheezing.   Cardiovascular: Negative for chest pain, orthopnea and PND.  Gastrointestinal: Positive for abdominal pain (Right Flank). Negative for diarrhea, nausea and vomiting.  Genitourinary: Negative for dysuria and hematuria.  Neurological: Negative for dizziness, sensory change and focal weakness.  All other systems reviewed and are negative.   Nutrition: Heart healthy Tolerating Diet: Yes Tolerating PT: Ambulatory  DRUG ALLERGIES:  No Known Allergies  VITALS:  Blood pressure (!) 97/57, pulse 66, temperature 98.3 F (36.8 C), temperature source Oral, resp. rate 18, height 6' (1.829 m), weight 66.1 kg (145 lb 12.8 oz), SpO2 96 %.  PHYSICAL EXAMINATION:   Physical Exam  GENERAL:  61 y.o.-year-old patient lying in bed in no acute distress.  EYES: Pupils equal, round, reactive to light and accommodation. No scleral icterus. Extraocular muscles intact.  HEENT: Head atraumatic, normocephalic. Oropharynx and nasopharynx clear.  NECK:  Supple, no jugular venous distention. No thyroid enlargement, no tenderness.  LUNGS: Normal breath sounds bilaterally, no wheezing, rales, rhonchi. No use of accessory muscles of respiration.  CARDIOVASCULAR: S1, S2 normal. No murmurs, rubs, or gallops.  ABDOMEN: Soft, Right flank pain but no rebound, rigidity   nondistended. Bowel sounds present. No organomegaly or mass.  EXTREMITIES: No cyanosis, clubbing or edema b/l.    NEUROLOGIC: Cranial nerves II through XII are intact. No focal Motor or sensory deficits b/l.   PSYCHIATRIC: The patient is alert and oriented x 3.  SKIN: No obvious rash, lesion, or ulcer.    LABORATORY PANEL:   CBC  Recent Labs Lab 09/05/16 1133  WBC 8.7  HGB 11.8*  HCT 35.2*  PLT 297   ------------------------------------------------------------------------------------------------------------------  Chemistries   Recent Labs Lab 09/05/16 1133 09/06/16 0457  NA 137 142  K 4.9 4.3  CL 105 109  CO2 26 27  GLUCOSE 180* 94  BUN 20 18  CREATININE 1.38* 1.00  CALCIUM 9.0 8.6*  AST 15  --   ALT 13*  --   ALKPHOS 73  --   BILITOT 0.8  --    ------------------------------------------------------------------------------------------------------------------  Cardiac Enzymes  Recent Labs Lab 09/05/16 1133  TROPONINI <0.03   ------------------------------------------------------------------------------------------------------------------  RADIOLOGY:  Mr Abdomen W Wo Contrast  Result Date: 09/06/2016 CLINICAL DATA:  Gerald Jefferson is a 61 y.o. male who presents to the ED for general ill feeling and flank pain since his last hospitalization here 2 weeks ago. Patient reports weakness, headache, chills and persistent flank pain. Patient was admitted pyelonephritis 2 weeks prior. EXAM: MRI ABDOMEN WITHOUT AND WITH CONTRAST TECHNIQUE: Multiplanar multisequence MR imaging of the abdomen was performed both before and after the administration of intravenous contrast. CONTRAST:  13 mL MultiHance COMPARISON:  CT 08/21/2016, ultrasound 09/05/2016 FINDINGS: Lower chest:  Lung bases are clear. Hepatobiliary: No focal hepatic lesion. No biliary duct dilatation. Gallbladder normal. Common bile duct normal caliber. Pancreas:  Normal pancreatic parenchyma. No pancreatic duct  dilatation. Spleen: Normal spleen. Adrenals/urinary tract: Adrenal glands are normal. Within the mid RIGHT kidney 17 mm rounded lesion is hyperintense on T2 weighted imaging (image 11, series 5) and demonstrates no post-contrast enhancement (image 30, series 12). Exophytic 15 mm lesion extending from the upper pole LEFT kidney is hyperintense T2 weighted imaging in also demonstrates no post-contrast enhancement. This lesion is hyperintense on T1 weighted imaging precontrast indicating internal blood product or protein. Stomach/Bowel: Stomach and limited of the small bowel is unremarkable Vascular/Lymphatic: Abdominal aortic normal caliber. No retroperitoneal periportal lymphadenopathy. Musculoskeletal: No aggressive osseous lesion IMPRESSION: 1. Bosniak 1 renal cysts of the RIGHT kidney. 2. Bosniak 2 renal cysts of the LEFT kidney. No follow-up recommended for these probable benign lesions. 3. No evidence of acute pyelonephritis. Electronically Signed   By: Gerald Jefferson M.D.   On: 09/06/2016 13:51   US Renal  Result Date: 09/05/2016 CLINICAL DATA:  Persistent flank pain. EXAM: RENAL / URINARY TRACT ULTRASOUND COMPLETE COMPARISON:  CT abdomen and pelvis dated August 21, 2016. FINDINGS: Right Kidney: Length: 10.9 cm. Echogenicity within normal limits. There is a 2.1 x 1.9 x 2.0 cm complex cyst in the midpole of the right kidney, corresponding to the lesion seen on recent CT. Additional 1.2 cm simple cyst visualized in the lower pole. No hydronephrosis visualized. Left Kidney: Length: 11.2 cm. Echogenicity within normal limits. There is a 1.4 cm simple cyst in the midpole. No mass or hydronephrosis visualized. Bladder: Appears normal for degree of bladder distention. Partially visualized penile implant reservoir. IMPRESSION: 1. 2.1 cm complex cyst in the midpole of the right kidney, corresponding to lesion seen on recent CT. Further characterization with non-emergent contrast-enhanced MRI is recommended. 2. No  hydronephrosis. Electronically Signed   By: Gerald Jefferson M.D.   On: 09/05/2016 15:43     ASSESSMENT AND PLAN:   61 year old male with past medical history of hypertension, hyperlipidemia, previous history of DVT, anxiety, depression, recent admission for acute pyelonephritis who presents to the hospital due to right flank pain nausea and vomiting and positive urinalysis.  1. Acute pyelonephritis - suspected diagnosis given patient's abnormal urinalysis and right flank pain. Patient's renal ultrasound did show a complex renal cyst but the MRI of the abdomen shows benign cysts which is unlikely the source of patient's pain. -Continue supportive care with IV fluids, anti-emetics, IV antibiotics with ceftriaxone. Follow urine cultures. -On his previous hospitalization he had insignificant growth in his urine.  2. Renal cysts-this was incidentally noted on his renal ultrasound. His MRI of his abdomen confirms benign cysts. No further follow-up is recommended presently. On the MRI there is no evidence of acute pyelonephritis.  3. Anxiety/depression-continue BuSpar, Klonopin, Lexapro.  4. Restless leg syndrome-continue Requip. 5. Hyperlipidemia-continue Crestor.  6. GERD-continue Protonix.  7. COPD-no acute exacerbation-continue Dulera.  All the records are reviewed and case discussed with Care Management/Social Worker. Management plans discussed with the patient, family and they are in agreement.  CODE STATUS: Full code  DVT Prophylaxis: Hep SQ  TOTAL TIME TAKING CARE OF THIS PATIENT: 30 minutes.   POSSIBLE D/C IN 1-2 DAYS, DEPENDING ON CLINICAL CONDITION.   Henreitta Leber M.D on 09/06/2016 at 2:50 PM  Between 7am to 6pm - Pager - [phone]  After 6pm go to [email or web address] - Proofreader  [facility] [place]  Office  [phone]  CC: Primary care physician; [name], MD

## 2016-09-07 LAB — URINE CULTURE
Culture: >=100000 — AB
Special Requests: NORMAL

## 2016-09-07 MED ORDER — MEROPENEM 1 G IV SOLR
1.0000 g | Freq: Three times a day (TID) | INTRAVENOUS | Status: DC
  Administered 2016-09-07 – 2016-09-08 (×4): 1 g via INTRAVENOUS
  Filled 2016-09-07 (×7): qty 1

## 2016-09-07 MED ORDER — SODIUM CHLORIDE 0.9% FLUSH: 10.0000 mL | INTRAVENOUS | Status: DC | PRN

## 2016-09-07 NOTE — Progress Notes (Signed)
[facility] [place] NAME: Gerald Jefferson    MR#:  [id number]  DATE OF BIRTH:  Feb 06, 1956  SUBJECTIVE:   Patient here due to right flank pain and a positive urinalysis suspected to have acute pyelonephritis. He was recently hospitalized 2 weeks ago for similar reasons and discharged on oral antibiotics.  The patient still complains of dysuria and urinary frequency and flank pain. No fever or chills. REVIEW OF SYSTEMS:    Review of Systems  Constitutional: Negative for chills and fever.  HENT: Negative for congestion and tinnitus.   Eyes: Negative for blurred vision and double vision.  Respiratory: Negative for cough, shortness of breath and wheezing.   Cardiovascular: Negative for chest pain, orthopnea and PND.  Gastrointestinal: Positive for abdominal pain (Right Flank). Negative for diarrhea, nausea and vomiting.  Genitourinary: Positive for dysuria and frequency. Negative for hematuria.  Neurological: Negative for dizziness, sensory change and focal weakness.  All other systems reviewed and are negative.   Nutrition: Heart healthy Tolerating Diet: Yes Tolerating PT: Ambulatory  DRUG ALLERGIES:  No Known Allergies  VITALS:  Blood pressure 115/66, pulse 66, temperature 97.9 F (36.6 C), temperature source Oral, resp. rate 19, height 6' (1.829 m), weight 145 lb 12.8 oz (66.1 kg), SpO2 96 %.  PHYSICAL EXAMINATION:   Physical Exam  GENERAL:  61 y.o.-year-old patient lying in bed in no acute distress.  EYES: Pupils equal, round, reactive to light and accommodation. No scleral icterus. Extraocular muscles intact.  HEENT: Head atraumatic, normocephalic. Oropharynx and nasopharynx clear.  NECK:  Supple, no jugular venous distention. No thyroid enlargement, no tenderness.  LUNGS: Normal breath sounds bilaterally, no wheezing, rales, rhonchi. No use of accessory muscles of respiration.  CARDIOVASCULAR: S1, S2 normal. No murmurs, rubs, or  gallops.  ABDOMEN: Soft, Right flank pain but no rebound, rigidity  nondistended. Bowel sounds present. No organomegaly or mass.  EXTREMITIES: No cyanosis, clubbing or edema b/l.    NEUROLOGIC: Cranial nerves II through XII are intact. No focal Motor or sensory deficits b/l.   PSYCHIATRIC: The patient is alert and oriented x 3.  SKIN: No obvious rash, lesion, or ulcer.    LABORATORY PANEL:   CBC  Recent Labs Lab 09/05/16 1133  WBC 8.7  HGB 11.8*  HCT 35.2*  PLT 297   ------------------------------------------------------------------------------------------------------------------  Chemistries   Recent Labs Lab 09/05/16 1133 09/06/16 0457  NA 137 142  K 4.9 4.3  CL 105 109  CO2 26 27  GLUCOSE 180* 94  BUN 20 18  CREATININE 1.38* 1.00  CALCIUM 9.0 8.6*  AST 15  --   ALT 13*  --   ALKPHOS 73  --   BILITOT 0.8  --    ------------------------------------------------------------------------------------------------------------------  Cardiac Enzymes  Recent Labs Lab 09/05/16 1133  TROPONINI <0.03   ------------------------------------------------------------------------------------------------------------------  RADIOLOGY:  Mr Abdomen W Wo Contrast  Result Date: 09/06/2016 CLINICAL DATA:  Gerald Jefferson is a 61 y.o. male who presents to the ED for general ill feeling and flank pain since his last hospitalization here 2 weeks ago. Patient reports weakness, headache, chills and persistent flank pain. Patient was admitted pyelonephritis 2 weeks prior. EXAM: MRI ABDOMEN WITHOUT AND WITH CONTRAST TECHNIQUE: Multiplanar multisequence MR imaging of the abdomen was performed both before and after the administration of intravenous contrast. CONTRAST:  13 mL MultiHance COMPARISON:  CT 08/21/2016, ultrasound 09/05/2016 FINDINGS: Lower chest:  Lung bases are clear. Hepatobiliary: No focal hepatic lesion. No biliary duct  dilatation. Gallbladder normal. Common bile duct normal  caliber. Pancreas: Normal pancreatic parenchyma. No pancreatic duct dilatation. Spleen: Normal spleen. Adrenals/urinary tract: Adrenal glands are normal. Within the mid RIGHT kidney 17 mm rounded lesion is hyperintense on T2 weighted imaging (image 11, series 5) and demonstrates no post-contrast enhancement (image 30, series 12). Exophytic 15 mm lesion extending from the upper pole LEFT kidney is hyperintense T2 weighted imaging in also demonstrates no post-contrast enhancement. This lesion is hyperintense on T1 weighted imaging precontrast indicating internal blood product or protein. Stomach/Bowel: Stomach and limited of the small bowel is unremarkable Vascular/Lymphatic: Abdominal aortic normal caliber. No retroperitoneal periportal lymphadenopathy. Musculoskeletal: No aggressive osseous lesion IMPRESSION: 1. Bosniak 1 renal cysts of the RIGHT kidney. 2. Bosniak 2 renal cysts of the LEFT kidney. No follow-up recommended for these probable benign lesions. 3. No evidence of acute pyelonephritis. Electronically Signed   By: Suzy Bouchard M.D.   On: 09/06/2016 13:51     ASSESSMENT AND PLAN:   61 year old male with past medical history of hypertension, hyperlipidemia, previous history of DVT, anxiety, depression, recent admission for acute pyelonephritis who presents to the hospital due to right flank pain nausea and vomiting and positive urinalysis.  1. Recurrent ESBL UTI.  No Acute pyelonephritis per MRI. Patient's renal ultrasound did show a complex renal cyst but the MRI of the abdomen shows benign cysts which is unlikely the source of patient's pain. Discontinued IV ceftriaxone and changed to meropenem IV since urine culture showed ESBL.  PICC line placement and invanz IV for 14 days per Dr. Ola Spurr.  2. Renal cysts-this was incidentally noted on his renal ultrasound. His MRI of his abdomen confirms benign cysts.  The patient and need to follow-up with urologist as outpatient per Dr.  Ola Spurr.  3. Anxiety/depression-continue BuSpar, Klonopin, Lexapro.  4. Restless leg syndrome-continue Requip. 5. Hyperlipidemia-continue Crestor.  6. GERD-continue Protonix.  7. COPD-no acute exacerbation-continue Dulera. I discussed with Dr. Ola Spurr. All the records are reviewed and case discussed with Care Management/Social Worker. Management plans discussed with the patient, his sister and they are in agreement.  CODE STATUS: Full code  DVT Prophylaxis: Hep SQ  TOTAL TIME TAKING CARE OF THIS PATIENT: 30 minutes.   POSSIBLE D/C IN 1-2 DAYS, DEPENDING ON CLINICAL CONDITION.   Demetrios Loll M.D on 09/07/2016 at 6:36 PM  Between 7am to 6pm - Pager - [phone]  After 6pm go to [email or web address] - Proofreader  [facility]  Office  [phone]  CC: Primary care physician; [name], MD

## 2016-09-07 NOTE — Progress Notes (Signed)
Peripherally Inserted Central Catheter/Midline Placement  The IV Nurse has discussed with the patient and/or persons authorized to consent for the patient, the purpose of this procedure and the potential benefits and risks involved with this procedure.  The benefits include less needle sticks, lab draws from the catheter, and the patient may be discharged home with the catheter. Risks include, but not limited to, infection, bleeding, blood clot (thrombus formation), and puncture of an artery; nerve damage and irregular heartbeat and possibility to perform a PICC exchange if needed/ordered by physician.  Alternatives to this procedure were also discussed.  Bard Power PICC patient education guide, fact sheet on infection prevention and patient information card has been provided to patient /or left at bedside.    PICC/Midline Placement Documentation  PICC Single Lumen 55/37/48 PICC Right Basilic 40 cm 0 cm (Active)  Indication for Insertion or Continuance of Line Home intravenous therapies (PICC only) 09/07/2016  3:40 PM  Exposed Catheter (cm) 0 cm 09/07/2016  3:40 PM  Site Assessment Clean;Dry;Intact 09/07/2016  3:40 PM  Line Status Blood return noted;Flushed;Saline locked 09/07/2016  3:40 PM  Dressing Type Transparent 09/07/2016  3:40 PM  Dressing Status Clean;Dry;Intact;Antimicrobial disc in place 09/07/2016  3:40 PM  Dressing Intervention New dressing 09/07/2016  3:40 PM  Dressing Change Due 09/14/16 09/07/2016  3:40 PM       Aldona Lento L 09/07/2016, 3:55 PM

## 2016-09-07 NOTE — Consult Note (Signed)
[facility] Infectious Disease     Reason for Consult ESBL E coli UTI  Referring Physician: Estanislado Spire Date of Admission:  09/05/2016   Active Problems:   Pyelonephritis   HPI: Gerald Jefferson is a 61 y.o. male admitted with persistent flank pain, chills nausea and weakness for 2 weeks. Recent admission 7/15-7/17 with similar sxs and felt to have pyelonephritis with UA with TNTC WBC, but UCX < 10 K growth. Was treated with CTX then dced on oral ceftin. howevere sxs recurred. He has no hx prior UTIs, nor BPH or urinary issues per his report. He does have penile implant placed a year ago and that does have some chronic mild pain but otherwise no issues.   On this admission had UA TNTC WBC and UCX with ESBL E coli. CT last admit shows complex cysts R kidney. MRI this admis shows cysts but no evidence of pyelo.  Past Medical History:  Diagnosis Date  . Acid reflux   . Anxiety   . Arthritis   . Asthma   . Back pain    lower  . Coronary artery disease    a. remote PCI/DES to the LAD and OM in 2008; b. [facility] 2009 patent stents with mild RCA disease; c. lexiscan myoview 2018: intermediate risk with small sized, moderate in severity mild inferoseptal defect that may have represented artifact or scar. There was also a small sized, mild in severity reversible defect involving the mid and apical anterior segments seen only on attenuation corrected imaged   . Depression   . DVT (deep venous thrombosis) ([place]) 2008   a. DVT-leg and lung; b. complicated by GI bleed on triple therapy   . ED (erectile dysfunction)   . Essential hypertension   . GI bleed   . Hyperlipidemia   . Pulmonary embolism ([place])    a. complicated by GI on triple therapy  . RLS (restless legs syndrome)    Past Surgical History:  Procedure Laterality Date  . CARDIAC CATHETERIZATION  07/2007   Patent LAD/OM stents. Stable 40% stenosis in proximal RCA  . CORONARY ANGIOPLASTY WITH STENT PLACEMENT  04/2006   Cypher DES to LAD and  OM1  . IVC FILTER PLACEMENT ([facility] HX)    . JOINT REPLACEMENT    . NOSE SURGERY    . PENILE PROSTHESIS IMPLANT N/A 05/20/2015   Procedure: PENILE PROTHESIS INFLATABLE;  Surgeon: Nickie Retort, MD;  Location: [facility] ORS;  Service: Urology;  Laterality: N/A;  . PENILE PROSTHESIS IMPLANT N/A 10/29/2015   Procedure: REVISION PENILE PROTHESIS;  Surgeon: Nickie Retort, MD;  Location: [facility] ORS;  Service: Urology;  Laterality: N/A;  . PERIPHERAL VASCULAR CATHETERIZATION N/A 07/08/2015   unsuccessful, was not removed, only attempted Procedure: IVC Filter Removal;  Surgeon: Algernon Huxley, MD;  Location: [facility];  Service: Cardiovascular;  Laterality: N/A;  . TOTAL HIP ARTHROPLASTY Left    Social History  Substance Use Topics  . Smoking status: Former Smoker    Packs/day: 1.50    Years: 30.00    Types: Cigarettes    Quit date: 04/08/2006  . Smokeless tobacco: Never Used  . Alcohol use No   Family History  Problem Relation Age of Onset  . Hypertension Mother   . Kidney cancer Neg Hx   . Kidney disease Neg Hx   . Prostate cancer Neg Hx     Allergies: No Known Allergies  Current antibiotics: Antibiotics Given (last 72 hours)    Date/Time  Action Medication Dose Rate   09/05/16 1456 New Bag/Given   cefTRIAXone (ROCEPHIN) 2 g in dextrose 5 % 50 mL IVPB 2 g 100 mL/hr   09/06/16 1058 New Bag/Given   cefTRIAXone (ROCEPHIN) 1 g in dextrose 5 % 50 mL IVPB 1 g 100 mL/hr   09/07/16 0954 New Bag/Given   meropenem (MERREM) 1 g in sodium chloride 0.9 % 100 mL IVPB 1 g 200 mL/hr   09/07/16 1409 New Bag/Given   meropenem (MERREM) 1 g in sodium chloride 0.9 % 100 mL IVPB 1 g 200 mL/hr      MEDICATIONS: . aspirin EC  81 mg Oral Daily  . Azelastine HCl  1 spray Nasal Daily  . busPIRone  15 mg Oral BID  . clonazePAM  1 mg Oral q morning - 10a  . dronabinol  5 mg Oral BID  . escitalopram  10 mg Oral Daily  . ferrous sulfate  325 mg Oral Daily  . gabapentin  600 mg Oral TID  .  heparin  5,000 Units Subcutaneous Q8H  . HYDROcodone-acetaminophen  1 tablet Oral QID  . mometasone-formoterol  2 puff Inhalation BID  . pantoprazole  40 mg Oral QAC breakfast  . phenazopyridine  100 mg Oral BID  . rOPINIRole  0.25 mg Oral QHS  . rosuvastatin  20 mg Oral QHS  . tiZANidine  2 mg Oral QHS  . cyanocobalamin  1,000 mcg Oral Daily    Review of Systems - 11 systems reviewed and negative per HPI   OBJECTIVE: Temp:  [97.9 F (36.6 C)-98 F (36.7 C)] 97.9 F (36.6 C) (08/01 1100) Pulse Rate:  [63-70] 66 (08/01 1100) Resp:  [19-20] 19 (08/01 1100) BP: (102-123)/(65-74) 115/66 (08/01 1100) SpO2:  [95 %-97 %] 96 % (08/01 1100) Physical Exam  Constitutional: He is oriented to person, place, and time. He appears well-developed and well-nourished. No distress.  HENT:  Mouth/Throat: Oropharynx is clear and moist. No oropharyngeal exudate.  Cardiovascular: Normal rate, regular rhythm and normal heart sounds. Exam reveals no gallop and no friction rub.  No murmur heard.  Pulmonary/Chest: Effort normal and breath sounds normal. No respiratory distress. He has no wheezes.  Abdominal: Soft. Bowel sounds are normal. He exhibits no distension. There is no tenderness.  Lymphadenopathy:  He has no cervical adenopathy.  GU - penile implant with mild ttp but no induration nor redness Neurological: He is alert and oriented to person, place, and time.  Skin: Skin is warm and dry. No rash noted. No erythema.  Psychiatric: He has a normal mood and affect. His behavior is normal.     LABS: Results for orders placed or performed during the hospital encounter of 09/05/16 (from the past 48 hour(s))  Basic metabolic panel     Status: Abnormal   Collection Time: 09/06/16  4:57 AM  Result Value Ref Range   Sodium 142 135 - 145 mmol/L   Potassium 4.3 3.5 - 5.1 mmol/L   Chloride 109 101 - 111 mmol/L   CO2 27 22 - 32 mmol/L   Glucose, Bld 94 65 - 99 mg/dL   BUN 18 6 - 20 mg/dL    Creatinine, Ser 1.00 0.61 - 1.24 mg/dL   Calcium 8.6 (L) 8.9 - 10.3 mg/dL   GFR calc non Af Amer >60 >60 mL/min   GFR calc Af Amer >60 >60 mL/min    Comment: (NOTE) The eGFR has been calculated using the CKD EPI equation. This calculation has not been validated in  all clinical situations. eGFR's persistently <60 mL/min signify possible Chronic Kidney Disease.    Anion gap 6 5 - 15   No components found for: ESR, C REACTIVE PROTEIN MICRO: Recent Results (from the past 720 hour(s))  Urine Culture     Status: Abnormal   Collection Time: 08/21/16  7:58 AM  Result Value Ref Range Status   Specimen Description URINE, RANDOM  Final   Special Requests NONE  Final   Culture (A)  Final    <10,000 COLONIES/mL INSIGNIFICANT GROWTH Performed at [facility], [address] [place], [place] [address]    Report Status 08/22/2016 FINAL  Final  Urine culture     Status: Abnormal   Collection Time: 09/05/16 11:33 AM  Result Value Ref Range Status   Specimen Description URINE, CLEAN CATCH  Final   Special Requests Normal  Final   Culture (A)  Final    >=100,000 COLONIES/mL ESCHERICHIA COLI Confirmed Extended Spectrum Beta-Lactamase Producer (ESBL) Performed at [facility], [place] [address] [place], [place] [address]    Report Status 09/07/2016 FINAL  Final   Organism ID, Bacteria ESCHERICHIA COLI (A)  Final      Susceptibility   Escherichia coli - MIC*    AMPICILLIN >=32 RESISTANT Resistant     CEFAZOLIN >=64 RESISTANT Resistant     CEFTRIAXONE >=64 RESISTANT Resistant     CIPROFLOXACIN >=4 RESISTANT Resistant     GENTAMICIN <=1 SENSITIVE Sensitive     IMIPENEM <=0.25 SENSITIVE Sensitive     NITROFURANTOIN <=16 SENSITIVE Sensitive     TRIMETH/SULFA >=320 RESISTANT Resistant     AMPICILLIN/SULBACTAM 16 INTERMEDIATE Intermediate     PIP/TAZO <=4 SENSITIVE Sensitive     Extended ESBL POSITIVE Resistant     * >=100,000 COLONIES/mL ESCHERICHIA COLI    IMAGING: Mr Abdomen W  Wo Contrast  Result Date: 09/06/2016 CLINICAL DATA:  CASYN BECVAR is a 61 y.o. male who presents to the ED for general ill feeling and flank pain since his last hospitalization here 2 weeks ago. Patient reports weakness, headache, chills and persistent flank pain. Patient was admitted pyelonephritis 2 weeks prior. EXAM: MRI ABDOMEN WITHOUT AND WITH CONTRAST TECHNIQUE: Multiplanar multisequence MR imaging of the abdomen was performed both before and after the administration of intravenous contrast. CONTRAST:  13 mL MultiHance COMPARISON:  CT 08/21/2016, ultrasound 09/05/2016 FINDINGS: Lower chest:  Lung bases are clear. Hepatobiliary: No focal hepatic lesion. No biliary duct dilatation. Gallbladder normal. Common bile duct normal caliber. Pancreas: Normal pancreatic parenchyma. No pancreatic duct dilatation. Spleen: Normal spleen. Adrenals/urinary tract: Adrenal glands are normal. Within the mid RIGHT kidney 17 mm rounded lesion is hyperintense on T2 weighted imaging (image 11, series 5) and demonstrates no post-contrast enhancement (image 30, series 12). Exophytic 15 mm lesion extending from the upper pole LEFT kidney is hyperintense T2 weighted imaging in also demonstrates no post-contrast enhancement. This lesion is hyperintense on T1 weighted imaging precontrast indicating internal blood product or protein. Stomach/Bowel: Stomach and limited of the small bowel is unremarkable Vascular/Lymphatic: Abdominal aortic normal caliber. No retroperitoneal periportal lymphadenopathy. Musculoskeletal: No aggressive osseous lesion IMPRESSION: 1. Bosniak 1 renal cysts of the RIGHT kidney. 2. Bosniak 2 renal cysts of the LEFT kidney. No follow-up recommended for these probable benign lesions. 3. No evidence of acute pyelonephritis. Electronically Signed   By: Suzy Bouchard M.D.   On: 09/06/2016 13:51   Ct Abdomen Pelvis W Contrast  Result Date: 08/21/2016 CLINICAL DATA:  Right lower  quadrant and right flank pain.  EXAM: CT ABDOMEN AND PELVIS WITH CONTRAST TECHNIQUE: Multidetector CT imaging of the abdomen and pelvis was performed using the standard protocol following bolus administration of intravenous contrast. CONTRAST:  119m ISOVUE-300 IOPAMIDOL (ISOVUE-300) INJECTION 61% COMPARISON:  06/30/2015 FINDINGS: Lower chest: No acute abnormality. Hepatobiliary: No focal hepatic abnormality. Gallbladder unremarkable. Pancreas: No focal abnormality or ductal dilatation. Spleen: No focal abnormality.  Normal size. Adrenals/Urinary Tract: Bilateral renal cysts. No hydronephrosis. Complex cystic area in the midpole of the right kidney on image 15 of delayed renal series (series 5). Adrenal glands and urinary bladder unremarkable. Stomach/Bowel: Normal appendix. Sigmoid diverticulosis. No active diverticulitis. Stomach and small bowel decompressed. Vascular/Lymphatic: Diffuse aortic and iliac calcifications. No aneurysm or adenopathy. IVC filter in place. The legs extend through the wall of the IVC, unchanged since prior study. Reproductive: Penile implant noted. Other: No free fluid or free air. Musculoskeletal: No acute bony abnormality. IMPRESSION: Complex cysts in the midpole of the right kidney. These warrant additional characterisation or follow-up. These could be further characterized with MRI with and without contrast or followed with repeat CT or ultrasound in 6 months. IVC filter legs extend through the wall of the IVC, stable since prior study. Aortoiliac atherosclerosis. Sigmoid diverticulosis. No acute findings. Electronically Signed   By: KRolm BaptiseM.D.   On: 08/21/2016 09:26   [place]orea[place]al  Result Date: 09/05/2016 CLINICAL DATA:  Persistent flank pain. EXAM: RENAL / URINARY TRACT ULTRASOUND COMPLETE COMPARISON:  CT abdomen and pelvis dated August 21, 2016. FINDINGS: Right Kidney: Length: 10.9 cm. Echogenicity within normal limits. There is a 2.1 x 1.9 x 2.0 cm complex cyst in the midpole of the right kidney,  corresponding to the lesion seen on recent CT. Additional 1.2 cm simple cyst visualized in the lower pole. No hydronephrosis visualized. Left Kidney: Length: 11.2 cm. Echogenicity within normal limits. There is a 1.4 cm simple cyst in the midpole. No mass or hydronephrosis visualized. Bladder: Appears normal for degree of bladder distention. Partially visualized penile implant reservoir. IMPRESSION: 1. 2.1 cm complex cyst in the midpole of the right kidney, corresponding to lesion seen on recent CT. Further characterization with non-emergent contrast-enhanced MRI is recommended. 2. No hydronephrosis. Electronically Signed   By: WTitus DubinM.D.   On: 09/05/2016 15:43    Assessment:   AEZEKEIL BETHELis a 61y.o. male with penile implant, no prior UTI or BPH sxs now with ESBL E coli following prior admission with culture negative. MRI imaging shows cysts but no pyelo.    Given resistant nature of his infection he will need picc and IV abx for 14 days  Recommendations PICC placed IV ertapenem 1 gm q 24 for 14 days - see abx order sheet Should fu as otpt with urology prior to dc of IV abx to ensure no need for further management of the cysts noted on imaging and no need for further evaluation of his penile prosthesis.   Thank you very much for allowing me to participate in the care of this patient. Please call with questions.   DCheral Marker FOla Spurr MD

## 2016-09-07 NOTE — Progress Notes (Signed)
Infectious Disease Long Term IV Antibiotic Orders  Diagnosis: ESBL E coli UTI  Culture results Results for orders placed or performed during the hospital encounter of 09/05/16  Urine culture     Status: Abnormal   Collection Time: 09/05/16 11:33 AM  Result Value Ref Range Status   Specimen Description URINE, CLEAN CATCH  Final   Special Requests Normal  Final   Culture (A)  Final    >=100,000 COLONIES/mL ESCHERICHIA COLI Confirmed Extended Spectrum Beta-Lactamase Producer (ESBL) Performed at [facility], [address], [place],  [address]    Report Status 09/07/2016 FINAL  Final   Organism ID, Bacteria ESCHERICHIA COLI (A)  Final      Susceptibility   Escherichia coli - MIC*    AMPICILLIN >=32 RESISTANT Resistant     CEFAZOLIN >=64 RESISTANT Resistant     CEFTRIAXONE >=64 RESISTANT Resistant     CIPROFLOXACIN >=4 RESISTANT Resistant     GENTAMICIN <=1 SENSITIVE Sensitive     IMIPENEM <=0.25 SENSITIVE Sensitive     NITROFURANTOIN <=16 SENSITIVE Sensitive     TRIMETH/SULFA >=320 RESISTANT Resistant     AMPICILLIN/SULBACTAM 16 INTERMEDIATE Intermediate     PIP/TAZO <=4 SENSITIVE Sensitive     Extended ESBL POSITIVE Resistant     * >=100,000 COLONIES/mL ESCHERICHIA COLI    Allergies: No Known Allergies  Discharge antibiotics  Ertapenem      1 gram  every  24 hours  PICC Care per protocol Labs weekly while on IV antibiotics      CBC w diff   Comprehensive met panel  Planned duration of antibiotics 14 days from 8/1  Stop date 8/14 Follow up clinic date prior to 8/14 FAX weekly labs to [phone]  Leonel Ramsay, MD

## 2016-09-08 MED ORDER — SODIUM CHLORIDE 0.9 % IV SOLN: 1.0000 g | INTRAVENOUS | Status: AC

## 2016-09-08 MED ORDER — SODIUM CHLORIDE 0.9 % IV SOLN: 1.0000 g | INTRAVENOUS | Status: DC

## 2016-09-08 MED ORDER — SODIUM CHLORIDE 0.9 % IV SOLN
1.0000 g | Freq: Once | INTRAVENOUS | Status: AC
  Administered 2016-09-08: 1 g via INTRAVENOUS
  Filled 2016-09-08: qty 1

## 2016-09-08 MED ORDER — SODIUM CHLORIDE 0.9 % IV SOLN: 1.0000 g | Freq: Three times a day (TID) | INTRAVENOUS | Status: DC

## 2016-09-08 MED ORDER — ERTAPENEM SODIUM 1 G IJ SOLR
1.0000 g | INTRAMUSCULAR | Status: DC
  Filled 2016-09-08: qty 1

## 2016-09-08 NOTE — Progress Notes (Signed)
[place] [facility] [place] was admitted to the Hospital on 09/05/2016 and Discharged  09/08/2016 and should be excused from work/school   For 17  days starting 09/05/2016 , may return to work/school without any restrictions.  Demetrios Loll M.D on 09/08/2016,at 2:16 PM  [place] at [facility]  [phone]

## 2016-09-08 NOTE — Care Management Note (Addendum)
Case Management Note  Patient Details  Name: Gerald Jefferson MRN: [id number] Date of Birth: [birth year]  Subjective/Objective:                  RNCM met with patient and his wife to discuss discharge planning. Patient is not affiliated with [facility] but has [employer] background.  He is independent from home without any experience administering IV medications. He states he is not concerned about self-administration. He has no experience with home health agencies but requests the one that will give high-quality at the best cost. [facility] provide [facility] however they may be able to provide the antibiotic and contract with a home health agency. Home health choice provided to patient however patient request best offer.   Action/Plan: Tim with [facility] checking with nurse availability and will call this RNCM back. Octavia Bruckner has been updated on pharmacy request and patient request for best price. Per Corene Cornea $0 co-pay for antibiotic.  Hospital dose given at 11:30AM.   Expected Discharge Date:  09/08/16               Expected Discharge Plan:     In-House Referral:     Discharge planning Services  CM Consult  Post Acute Care Choice:  Home Health, Durable Medical Equipment Choice offered to:  Patient, Spouse  DME Arranged:    DME Agency:     HH Arranged:  RN [place] Agency:     Status of Service:  In process, will continue to follow  If discussed at Long Length of Stay Meetings, dates discussed:    Additional Comments:  Marshell Garfinkel, RN 09/08/2016, 11:34 AM

## 2016-09-08 NOTE — Discharge Summary (Signed)
[place] [facility] [place] NAME: Gerald Jefferson    MR#:  [id number]  DATE OF BIRTH:  Oct 30, 1955  DATE OF ADMISSION:  09/05/2016   ADMITTING PHYSICIAN: Demetrios Loll, MD  DATE OF DISCHARGE: 09/08/2016  2:47 PM  PRIMARY CARE PHYSICIAN: [name], MD   ADMISSION DIAGNOSIS:  Pyelonephritis [N12] DISCHARGE DIAGNOSIS:  Active Problems:   Pyelonephritis ESBL UTI SECONDARY DIAGNOSIS:   Past Medical History:  Diagnosis Date  . Acid reflux   . Anxiety   . Arthritis   . Asthma   . Back pain    lower  . Coronary artery disease    a. remote PCI/DES to the LAD and OM in 2008; b. [facility] 2009 patent stents with mild RCA disease; c. lexiscan myoview 2018: intermediate risk with small sized, moderate in severity mild inferoseptal defect that may have represented artifact or scar. There was also a small sized, mild in severity reversible defect involving the mid and apical anterior segments seen only on attenuation corrected imaged   . Depression   . DVT (deep venous thrombosis) ([place]) 2008   a. DVT-leg and lung; b. complicated by GI bleed on triple therapy   . ED (erectile dysfunction)   . Essential hypertension   . GI bleed   . Hyperlipidemia   . Pulmonary embolism ([place])    a. complicated by GI on triple therapy  . RLS (restless legs syndrome)    HOSPITAL COURSE:  61 year old male with past medical history of hypertension, hyperlipidemia, previous history of DVT, anxiety, depression, recent admission for acute pyelonephritis who presents to the hospital due to right flank pain nausea and vomiting and positive urinalysis.  1. ESBL UTI.  No Acute pyelonephritis per MRI. Patient's renal ultrasound did show a complex renal cyst but the MRI of the abdomen shows benign cysts which is unlikely the source of patient's pain. Discontinued IV ceftriaxone and changed to meropenem IV since urine culture showed ESBL.  PICC line placed and invanz IV for 14 days  per Dr. Ola Spurr.  2. Renal cysts-this was incidentally noted on his renal ultrasound. His MRI of his abdomen confirms benign cysts.  The patient and need to follow-up with urologist as outpatient per Dr. Ola Spurr.  3. Anxiety/depression-continue BuSpar, Klonopin, Lexapro.  4. Restless leg syndrome-continue Requip. 5. Hyperlipidemia-continue Crestor.  6. GERD-continue Protonix.  7. COPD-no acute exacerbation-continue Dulera.  DISCHARGE CONDITIONS:  Stable, discharged to home with home health today. CONSULTS OBTAINED:  Treatment Team:  Leonel Ramsay, MD DRUG ALLERGIES:  No Known Allergies DISCHARGE MEDICATIONS:   Allergies as of 09/08/2016   No Known Allergies     Medication List    TAKE these medications   aspirin EC 81 MG tablet Take 81 mg by mouth daily.   Azelastine HCl 0.15 % Soln 1 SPRAY/NARES DAILY   budesonide-formoterol 80-4.5 MCG/ACT inhaler Commonly known as:  SYMBICORT Inhale 2 puffs into the lungs 2 (two) times daily.   busPIRone 15 MG tablet Commonly known as:  BUSPAR Take 15 mg by mouth 2 (two) times daily.   clonazePAM 1 MG tablet Commonly known as:  KLONOPIN Take 1 mg by mouth every morning.   cyanocobalamin 1000 MCG tablet Take 1 tablet (1,000 mcg total) by mouth daily.   dronabinol 5 MG capsule Commonly known as:  MARINOL Take 5 mg by mouth 2 (two) times daily.   ertapenem 1 g in sodium chloride 0.9 % 50 mL Inject 1 g into the  vein daily.   escitalopram 10 MG tablet Commonly known as:  LEXAPRO Take 10 mg by mouth daily.   ferrous sulfate 325 (65 FE) MG tablet Take 325 mg by mouth daily.   gabapentin 600 MG tablet Commonly known as:  NEURONTIN Take 600 mg by mouth 3 (three) times daily.   HYDROcodone-acetaminophen 10-325 MG tablet Commonly known as:  NORCO Take 1 tablet by mouth every 6 (six) hours as needed. Takes qid daily   omeprazole 20 MG capsule Commonly known as:  PRILOSEC Take 1 capsule (20 mg total) by  mouth daily.   phenazopyridine 100 MG tablet Commonly known as:  PYRIDIUM Take 100 mg by mouth 2 (two) times daily.   PREVIDENT 5000 BOOSTER PLUS 1.1 % Pste Generic drug:  Sodium Fluoride Take 1 application by mouth 2 (two) times daily.   promethazine 25 MG tablet Commonly known as:  PHENERGAN Take 25 mg by mouth every 6 (six) hours as needed for nausea or vomiting.   rOPINIRole 0.25 MG tablet Commonly known as:  REQUIP Take 0.25 mg by mouth at bedtime.   rosuvastatin 20 MG tablet Commonly known as:  CRESTOR Take 20 mg by mouth at bedtime.   tiZANidine 2 MG tablet Commonly known as:  ZANAFLEX Take 2 mg by mouth at bedtime.        DISCHARGE INSTRUCTIONS:  See AVS.  If you experience worsening of your admission symptoms, develop shortness of breath, life threatening emergency, suicidal or homicidal thoughts you must seek medical attention immediately by calling 911 or calling your MD immediately  if symptoms less severe.  You Must read complete instructions/literature along with all the possible adverse reactions/side effects for all the Medicines you take and that have been prescribed to you. Take any new Medicines after you have completely understood and accpet all the possible adverse reactions/side effects.   Please note  You were cared for by a hospitalist during your hospital stay. If you have any questions about your discharge medications or the care you received while you were in the hospital after you are discharged, you can call the unit and asked to speak with the hospitalist on call if the hospitalist that took care of you is not available. Once you are discharged, your primary care physician will handle any further medical issues. Please note that NO REFILLS for any discharge medications will be authorized once you are discharged, as it is imperative that you return to your primary care physician (or establish a relationship with a primary care physician if you do  not have one) for your aftercare needs so that they can reassess your need for medications and monitor your lab values.    On the day of Discharge:  VITAL SIGNS:  Blood pressure 113/62, pulse 61, temperature 98.2 F (36.8 C), temperature source Oral, resp. rate 17, height 6' (1.829 m), weight 145 lb 12.8 oz (66.1 kg), SpO2 95 %. PHYSICAL EXAMINATION:  GENERAL:  61 y.o.-year-old patient lying in the bed with no acute distress.  EYES: Pupils equal, round, reactive to light and accommodation. No scleral icterus. Extraocular muscles intact.  HEENT: Head atraumatic, normocephalic. Oropharynx and nasopharynx clear.  NECK:  Supple, no jugular venous distention. No thyroid enlargement, no tenderness.  LUNGS: Normal breath sounds bilaterally, no wheezing, rales,rhonchi or crepitation. No use of accessory muscles of respiration.  CARDIOVASCULAR: S1, S2 normal. No murmurs, rubs, or gallops.  ABDOMEN: Soft, non-tender, non-distended. Bowel sounds present. No organomegaly or mass.  EXTREMITIES: No pedal edema,  cyanosis, or clubbing.  NEUROLOGIC: Cranial nerves II through XII are intact. Muscle strength 5/5 in all extremities. Sensation intact. Gait not checked.  PSYCHIATRIC: The patient is alert and oriented x 3.  SKIN: No obvious rash, lesion, or ulcer.  DATA REVIEW:   CBC  Recent Labs Lab 09/05/16 1133  WBC 8.7  HGB 11.8*  HCT 35.2*  PLT 297    Chemistries   Recent Labs Lab 09/05/16 1133 09/06/16 0457  NA 137 142  K 4.9 4.3  CL 105 109  CO2 26 27  GLUCOSE 180* 94  BUN 20 18  CREATININE 1.38* 1.00  CALCIUM 9.0 8.6*  AST 15  --   ALT 13*  --   ALKPHOS 73  --   BILITOT 0.8  --      Microbiology Results  Results for orders placed or performed during the hospital encounter of 09/05/16  Urine culture     Status: Abnormal   Collection Time: 09/05/16 11:33 AM  Result Value Ref Range Status   Specimen Description URINE, CLEAN CATCH  Final   Special Requests Normal  Final    Culture (A)  Final    >=100,000 COLONIES/mL ESCHERICHIA COLI Confirmed Extended Spectrum Beta-Lactamase Producer (ESBL) Performed at [facility], [place] [address] [place], [place] [address]    Report Status 09/07/2016 FINAL  Final   Organism ID, Bacteria ESCHERICHIA COLI (A)  Final      Susceptibility   Escherichia coli - MIC*    AMPICILLIN >=32 RESISTANT Resistant     CEFAZOLIN >=64 RESISTANT Resistant     CEFTRIAXONE >=64 RESISTANT Resistant     CIPROFLOXACIN >=4 RESISTANT Resistant     GENTAMICIN <=1 SENSITIVE Sensitive     IMIPENEM <=0.25 SENSITIVE Sensitive     NITROFURANTOIN <=16 SENSITIVE Sensitive     TRIMETH/SULFA >=320 RESISTANT Resistant     AMPICILLIN/SULBACTAM 16 INTERMEDIATE Intermediate     PIP/TAZO <=4 SENSITIVE Sensitive     Extended ESBL POSITIVE Resistant     * >=100,000 COLONIES/mL ESCHERICHIA COLI    RADIOLOGY:  No results found.   Management plans discussed with the patient, family and they are in agreement.  CODE STATUS: Prior   TOTAL TIME TAKING CARE OF THIS PATIENT: 36 minutes.    Demetrios Loll M.D on 09/08/2016 at 6:21 PM  Between 7am to 6pm - Pager - [phone]  After 6pm go to [email or web address] - Proofreader  [facility]  Office  [phone]  CC: Primary care physician; [name], MD   Note: This dictation was prepared with Dragon dictation along with smaller phrase technology. Any transcriptional errors that result from this process are unintentional.

## 2016-09-08 NOTE — Progress Notes (Signed)
Pt discharged per MD order. Pt given work note. Pt has had home health care arranged. Discharge instructions reviewed with pt. Questions answered to pt satisfaction. IV removed. Pt taken to car in wheelchair.

## 2016-09-08 NOTE — Care Management (Signed)
[facility] cannot provide home health nurse. [facility]ne Cornea with [facility] was able to make arrangements with [facility] care for home health nursing. Corene Cornea will meet with patient to update. I have updated both patient and bedside RN. No other RNCM needs.

## 2016-09-13 ENCOUNTER — Other Ambulatory Visit
Admission: RE | Admit: 2016-09-13 | Discharge: 2016-09-13 | Disposition: A | Discharge status: Home / Self Care | Payer: BLUE CROSS/BLUE SHIELD | Source: Ambulatory Visit | Attending: Infectious Diseases | Admitting: Infectious Diseases

## 2016-09-13 DIAGNOSIS — N390 Urinary tract infection, site not specified: Secondary | ICD-10-CM | POA: Diagnosis present

## 2016-09-13 DIAGNOSIS — B9629 Other Escherichia coli [E. coli] as the cause of diseases classified elsewhere: Secondary | ICD-10-CM | POA: Insufficient documentation

## 2016-09-13 LAB — CBC WITH DIFFERENTIAL/PLATELET
BASOS ABS: 0.1 10*3/uL (ref 0–0.1)
Basophils Relative: 1 %
EOS PCT: 2 %
Eosinophils Absolute: 0.1 10*3/uL (ref 0–0.7)
HEMATOCRIT: 36.4 % — AB (ref 40.0–52.0)
HEMOGLOBIN: 12.5 g/dL — AB (ref 13.0–18.0)
LYMPHS ABS: 1 10*3/uL (ref 1.0–3.6)
Lymphocytes Relative: 16 %
MCH: 31 pg (ref 26.0–34.0)
MCHC: 34.3 g/dL (ref 32.0–36.0)
MCV: 90.3 fL (ref 80.0–100.0)
MONOS PCT: 9 %
Monocytes Absolute: 0.5 10*3/uL (ref 0.2–1.0)
NEUTROS PCT: 72 %
Neutro Abs: 4.3 10*3/uL (ref 1.4–6.5)
Platelets: 299 10*3/uL (ref 150–440)
RBC: 4.03 MIL/uL — AB (ref 4.40–5.90)
RDW: 13.8 % (ref 11.5–14.5)
SMEAR REVIEW: ADEQUATE
WBC: 6 10*3/uL (ref 3.8–10.6)

## 2016-09-13 LAB — COMPREHENSIVE METABOLIC PANEL
ALT: 24 U/L (ref 17–63)
AST: 23 U/L (ref 15–41)
Albumin: 3.6 g/dL (ref 3.5–5.0)
Alkaline Phosphatase: 76 U/L (ref 38–126)
Anion gap: 8 (ref 5–15)
BUN: 20 mg/dL (ref 6–20)
CHLORIDE: 109 mmol/L (ref 101–111)
CO2: 24 mmol/L (ref 22–32)
Calcium: 9.5 mg/dL (ref 8.9–10.3)
Creatinine, Ser: 0.99 mg/dL (ref 0.61–1.24)
GFR calc Af Amer: >60 mL/min (ref >60)
Glucose, Bld: 111 mg/dL — ABNORMAL HIGH (ref 65–99)
POTASSIUM: 4 mmol/L (ref 3.5–5.1)
Sodium: 141 mmol/L (ref 135–145)
Total Bilirubin: 0.3 mg/dL (ref 0.3–1.2)
Total Protein: 7 g/dL (ref 6.5–8.1)

## 2016-09-19 ENCOUNTER — Encounter: Payer: Self-pay | Admitting: Cardiovascular Disease

## 2016-09-19 ENCOUNTER — Ambulatory Visit (INDEPENDENT_AMBULATORY_CARE_PROVIDER_SITE_OTHER): Payer: BLUE CROSS/BLUE SHIELD | Admitting: Cardiovascular Disease

## 2016-09-19 VITALS — BP 120/72 | HR 64 | Ht 72.0 in | Wt 143.5 lb

## 2016-09-19 DIAGNOSIS — E782 Mixed hyperlipidemia: Secondary | ICD-10-CM

## 2016-09-19 DIAGNOSIS — I10 Essential (primary) hypertension: Secondary | ICD-10-CM

## 2016-09-19 DIAGNOSIS — I2510 Atherosclerotic heart disease of native coronary artery without angina pectoris: Secondary | ICD-10-CM

## 2016-09-19 NOTE — Progress Notes (Signed)
Cardiology Office Note   Date:  09/19/2016   ID:  Gerald Jefferson, DOB [birth year], MRN [id number]  PCP:  [name], MD  Cardiologist:   Kathlyn Sacramento, MD   Chief Complaint  Patient presents with  . Other    6 month follow up. Patient c/o SOB. Meds reviewed verbally with patient.       History of Present Illness: Gerald Jefferson is a 61 y.o. male who presents for A follow-up visit regarding coronary artery disease. He has known history of coronary artery disease status post angioplasty and drug-eluting stent placement to the LAD as well as OM in 2008. He had pulmonary embolism and GI bleed in that same year. Cardiac catheterization in 2009 showed patent stents and mild disease in the right coronary artery.   The patient had significant stress and anxiety earlier this year after he was laid off work. He had a 20 pound weight loss. He had atypical chest pain. Nuclear stress test was inconclusive due to motion artifact. Echocardiogram Showed normal LV systolic function with no significant valvular abnormalities. He has been doing well overall with no recurrent chest pain or shortness of breath. His anxiety improved with treatment. He was hospitalized recently at [facility] for urinary tract infection and was discharged home on IV antibiotics with a PICC line. He feels better overall. He started walking part time in maintenance.   Past Medical History:  Diagnosis Date  . Acid reflux   . Anxiety   . Arthritis   . Asthma   . Back pain    lower  . Coronary artery disease    a. remote PCI/DES to the LAD and OM in 2008; b. [facility] 2009 patent stents with mild RCA disease; c. lexiscan myoview 2018: intermediate risk with small sized, moderate in severity mild inferoseptal defect that may have represented artifact or scar. There was also a small sized, mild in severity reversible defect involving the mid and apical anterior segments seen only on attenuation corrected imaged   . Depression    . DVT (deep venous thrombosis) ([place]) 2008   a. DVT-leg and lung; b. complicated by GI bleed on triple therapy   . ED (erectile dysfunction)   . Essential hypertension   . GI bleed   . Hyperlipidemia   . Pulmonary embolism ([place])    a. complicated by GI on triple therapy  . RLS (restless legs syndrome)     Past Surgical History:  Procedure Laterality Date  . CARDIAC CATHETERIZATION  07/2007   Patent LAD/OM stents. Stable 40% stenosis in proximal RCA  . CORONARY ANGIOPLASTY WITH STENT PLACEMENT  04/2006   [place] DES to LAD and OM1  . IVC FILTER PLACEMENT ([facility] HX)    . JOINT REPLACEMENT    . NOSE SURGERY    . PENILE PROSTHESIS IMPLANT N/A 05/20/2015   Procedure: PENILE PROTHESIS INFLATABLE;  Surgeon: Nickie Retort, MD;  Location: [facility] ORS;  Service: Urology;  Laterality: N/A;  . PENILE PROSTHESIS IMPLANT N/A 10/29/2015   Procedure: REVISION PENILE PROTHESIS;  Surgeon: Nickie Retort, MD;  Location: [facility] ORS;  Service: Urology;  Laterality: N/A;  . PERIPHERAL VASCULAR CATHETERIZATION N/A 07/08/2015   unsuccessful, was not removed, only attempted Procedure: IVC Filter Removal;  Surgeon: Algernon Huxley, MD;  Location: [facility];  Service: Cardiovascular;  Laterality: N/A;  . TOTAL HIP ARTHROPLASTY Left      Current Outpatient Prescriptions  Medication Sig Dispense Refill  . aspirin  EC 81 MG tablet Take 81 mg by mouth daily.    . Azelastine HCl 0.15 % SOLN 1 SPRAY/NARES DAILY  0  . budesonide-formoterol (SYMBICORT) 80-4.5 MCG/ACT inhaler Inhale 2 puffs into the lungs 2 (two) times daily.    . busPIRone (BUSPAR) 15 MG tablet Take 15 mg by mouth 2 (two) times daily.   3  . clonazePAM (KLONOPIN) 1 MG tablet Take 1 mg by mouth every morning.  1  . dronabinol (MARINOL) 5 MG capsule Take 5 mg by mouth 2 (two) times daily.  1  . ertapenem 1 g in sodium chloride 0.9 % 50 mL Inject 1 g into the vein daily.    Marland Kitchen escitalopram (LEXAPRO) 10 MG tablet Take 10 mg by mouth daily.  2    . ferrous sulfate 325 (65 FE) MG tablet Take 325 mg by mouth daily.     Marland Kitchen gabapentin (NEURONTIN) 600 MG tablet Take 600 mg by mouth 3 (three) times daily.    Marland Kitchen HYDROcodone-acetaminophen (NORCO) 10-325 MG tablet Take 1 tablet by mouth every 6 (six) hours as needed. Takes qid daily    . omeprazole (PRILOSEC) 20 MG capsule Take 1 capsule (20 mg total) by mouth daily. 30 capsule 6  . phenazopyridine (PYRIDIUM) 100 MG tablet Take 100 mg by mouth 2 (two) times daily.  0  . PREVIDENT 5000 BOOSTER PLUS 1.1 % PSTE Take 1 application by mouth 2 (two) times daily.  0  . promethazine (PHENERGAN) 25 MG tablet Take 25 mg by mouth every 6 (six) hours as needed for nausea or vomiting.    Marland Kitchen rOPINIRole (REQUIP) 0.25 MG tablet Take 0.25 mg by mouth at bedtime.    . rosuvastatin (CRESTOR) 20 MG tablet Take 20 mg by mouth at bedtime.     Marland Kitchen tiZANidine (ZANAFLEX) 2 MG tablet Take 2 mg by mouth at bedtime.      . vitamin B-12 1000 MCG tablet Take 1 tablet (1,000 mcg total) by mouth daily. 30 tablet 1   No current facility-administered medications for this visit.     Allergies:   Patient has no known allergies.    Social History:  The patient  reports that he quit smoking about 10 years ago. His smoking use included Cigarettes. He has a 45.00 pack-year smoking history. He has never used smokeless tobacco. He reports that he does not drink alcohol or use drugs.   Family History:  The patient's family history includes Hypertension in his mother.    ROS:  Please see the history of present illness.   Otherwise, review of systems are positive for none.   All other systems are reviewed and negative.    PHYSICAL EXAM: VS:  BP 120/72 (BP Location: Left Arm, Patient Position: Sitting, Cuff Size: Normal)   Pulse 64   Ht 6' (1.829 m)   Wt 143 lb 8 oz (65.1 kg)   BMI 19.46 kg/m  , BMI Body mass index is 19.46 kg/m. GEN: Well nourished, well developed, in no acute distress  HEENT: normal  Neck: no JVD, carotid  bruits, or masses Cardiac: RRR; no murmurs, rubs, or gallops,no edema  Respiratory:  clear to auscultation bilaterally, normal work of breathing GI: soft, nontender, nondistended, + BS MS: no deformity or atrophy  Skin: warm and dry, no rash Neuro:  Strength and sensation are intact Psych: euthymic mood, full affect   EKG:  EKG is ordered today. The ekg ordered today demonstrates normal sinus rhythm with no significant ST or  T wave changes.   Recent Labs: 09/13/2016: ALT 24; BUN 20; Creatinine, Ser 0.99; Hemoglobin 12.5; Platelets 299; Potassium 4.0; Sodium 141    Lipid Panel    Component Value Date/Time   CHOL 128 03/01/2016 0606   TRIG 67 03/01/2016 0606   HDL 69 03/01/2016 0606   CHOLHDL 1.9 03/01/2016 0606   VLDL 13 03/01/2016 0606   LDLCALC 46 03/01/2016 0606      Wt Readings from Last 3 Encounters:  09/19/16 143 lb 8 oz (65.1 kg)  09/05/16 145 lb 12.8 oz (66.1 kg)  08/21/16 141 lb 14.4 oz (64.4 kg)       ASSESSMENT AND PLAN:  1.  Coronary artery disease involving native coronary arteries without angina: Patient is doing well overall with no anginal symptoms. Continue medical therapy.   2. Essential hypertension:  Ramipril was discontinued during last visit due to orthostatic hypotension. His blood pressure is normal now.   3. Hyperlipidemia: Continue treatment with  rosuvastatin. This is being followed by Dr. Elijio Miles.   Disposition:   FU with me in 12 months.  Signed,  Kathlyn Sacramento, MD  09/19/2016 1:54 PM    [place]

## 2016-09-19 NOTE — Patient Instructions (Signed)
Medication Instructions: Continue same medications.   Labwork: None.   Procedures/Testing: None.   Follow-Up: 12 months with Dr. Arida.   Any Additional Special Instructions Will Be Listed Below (If Applicable).     If you need a refill on your cardiac medications before your next appointment, please call your pharmacy.   

## 2016-09-23 ENCOUNTER — Ambulatory Visit: Payer: BLUE CROSS/BLUE SHIELD

## 2016-10-06 ENCOUNTER — Encounter: Payer: Self-pay | Admitting: Urology

## 2016-10-06 ENCOUNTER — Ambulatory Visit: Payer: BLUE CROSS/BLUE SHIELD | Admitting: Urology

## 2016-10-06 VITALS — BP 145/72 | HR 70 | Ht 72.0 in | Wt 140.7 lb

## 2016-10-06 DIAGNOSIS — N159 Renal tubulo-interstitial disease, unspecified: Secondary | ICD-10-CM | POA: Diagnosis not present

## 2016-10-06 DIAGNOSIS — N529 Male erectile dysfunction, unspecified: Secondary | ICD-10-CM | POA: Diagnosis not present

## 2016-10-06 LAB — URINALYSIS, COMPLETE
BILIRUBIN UA: NEGATIVE
GLUCOSE, UA: NEGATIVE
Ketones, UA: NEGATIVE
Leukocytes, UA: NEGATIVE
Nitrite, UA: NEGATIVE
PH UA: 6 (ref 5.0–7.5)
PROTEIN UA: NEGATIVE
Specific Gravity, UA: <1.005 — ABNORMAL LOW (ref 1.005–1.030)
UUROB: 0.2 mg/dL (ref 0.2–1.0)

## 2016-10-06 LAB — MICROSCOPIC EXAMINATION
Bacteria, UA: NONE SEEN
RBC, UA: NONE SEEN /hpf (ref 0–?)
WBC UA: NONE SEEN /HPF (ref 0–?)

## 2016-10-06 NOTE — Progress Notes (Signed)
10/06/2016 2:27 PM   Gerald Jefferson 03/31/1955 [id number]  Referring provider: [name], MD [place], [place] [address]  Chief Complaint  Patient presents with  . Follow-up    Kidney infection    HPI: The patient is a 61 year old gentleman with a past medical history of erectile dysfunction status post IPP placement with revision at one point due to kinked tubing presents today after being diagnosed with an ESBL UTI.  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 He is otherwise doing well since discharge from the hospital completion of his antibiotics course. He normally voids well. This is his first urinary tract infection  In regards to his penile prosthesis, it works well for him. He is able to obtain an erection with activation device in a suitable for intercourse. He feels it works well. He had no scrotal swelling or pain in his genitalia penis at the time of his urinary tract infection.   PMH: Past Medical History:  Diagnosis Date  . Acid reflux   . Anxiety   . Arthritis   . Asthma   . Back pain    lower  . Coronary artery disease    a. remote PCI/DES to the LAD and OM in 2008; b. [facility] 2009 patent stents with mild RCA disease; c. lexiscan myoview 2018: intermediate risk with small sized, moderate in severity mild inferoseptal defect that may have represented artifact or scar. There was also a small sized, mild in severity reversible defect involving the mid and apical anterior segments seen only on attenuation corrected imaged   . Depression   . DVT (deep venous thrombosis) ([place]) 2008   a. DVT-leg and lung; b. complicated by GI bleed on triple therapy   . ED (erectile dysfunction)   . Essential hypertension   . GI bleed   . Hyperlipidemia   . Pulmonary embolism ([place])    a. complicated by GI on triple therapy  . RLS (restless legs syndrome)     Surgical History: Past Surgical History:  Procedure  Laterality Date  . CARDIAC CATHETERIZATION  07/2007   Patent LAD/OM stents. Stable 40% stenosis in proximal RCA  . CORONARY ANGIOPLASTY WITH STENT PLACEMENT  04/2006   Cypher DES to LAD and OM1  . IVC FILTER PLACEMENT ([facility] HX)    . JOINT REPLACEMENT    . NOSE SURGERY    . PENILE PROSTHESIS IMPLANT N/A 05/20/2015   Procedure: PENILE PROTHESIS INFLATABLE;  Surgeon: Nickie Retort, MD;  Location: [facility] ORS;  Service: Urology;  Laterality: N/A;  . PENILE PROSTHESIS IMPLANT N/A 10/29/2015   Procedure: REVISION PENILE PROTHESIS;  Surgeon: Nickie Retort, MD;  Location: [facility] ORS;  Service: Urology;  Laterality: N/A;  . PERIPHERAL VASCULAR CATHETERIZATION N/A 07/08/2015   unsuccessful, was not removed, only attempted Procedure: IVC Filter Removal;  Surgeon: Algernon Huxley, MD;  Location: [facility];  Service: Cardiovascular;  Laterality: N/A;  . TOTAL HIP ARTHROPLASTY Left     Home Medications:  Allergies as of 10/06/2016   No Known Allergies     Medication List       Accurate as of 10/06/16  2:27 PM. Always use your most recent med list.          aspirin EC 81 MG tablet Take 81 mg by mouth daily.   Azelastine HCl 0.15 % Soln 1 SPRAY/NARES DAILY   budesonide-formoterol 80-4.5 MCG/ACT inhaler Commonly  known as:  SYMBICORT Inhale 2 puffs into the lungs 2 (two) times daily.   busPIRone 15 MG tablet Commonly known as:  BUSPAR Take 15 mg by mouth 2 (two) times daily.   clonazePAM 1 MG tablet Commonly known as:  KLONOPIN Take 1 mg by mouth every morning.   cyanocobalamin 1000 MCG tablet Take 1 tablet (1,000 mcg total) by mouth daily.   dronabinol 5 MG capsule Commonly known as:  MARINOL Take 5 mg by mouth 2 (two) times daily.   ertapenem 1 g in sodium chloride 0.9 % 50 mL Inject 1 g into the vein daily.   escitalopram 10 MG tablet Commonly known as:  LEXAPRO Take 10 mg by mouth daily.   ferrous sulfate 325 (65 FE) MG tablet Take 325 mg by mouth daily.     gabapentin 600 MG tablet Commonly known as:  NEURONTIN Take 600 mg by mouth 3 (three) times daily.   HYDROcodone-acetaminophen 10-325 MG tablet Commonly known as:  NORCO Take 1 tablet by mouth every 6 (six) hours as needed. Takes qid daily   omeprazole 20 MG capsule Commonly known as:  PRILOSEC Take 1 capsule (20 mg total) by mouth daily.   phenazopyridine 100 MG tablet Commonly known as:  PYRIDIUM Take 100 mg by mouth 2 (two) times daily.   PREVIDENT 5000 BOOSTER PLUS 1.1 % Pste Generic drug:  Sodium Fluoride Take 1 application by mouth 2 (two) times daily.   promethazine 25 MG tablet Commonly known as:  PHENERGAN Take 25 mg by mouth every 6 (six) hours as needed for nausea or vomiting.   rOPINIRole 0.25 MG tablet Commonly known as:  REQUIP Take 0.25 mg by mouth at bedtime.   rosuvastatin 20 MG tablet Commonly known as:  CRESTOR Take 20 mg by mouth at bedtime.   tiZANidine 2 MG tablet Commonly known as:  ZANAFLEX Take 2 mg by mouth at bedtime.            Discharge Care Instructions        Start     Ordered   10/06/16 0000  Urinalysis, Complete     10/06/16 1342      Allergies: No Known Allergies  Family History: Family History  Problem Relation Age of Onset  . Hypertension Mother   . Kidney cancer Neg Hx   . Kidney disease Neg Hx   . Prostate cancer Neg Hx     Social History:  reports that he quit smoking about 10 years ago. His smoking use included Cigarettes. He has a 45.00 pack-year smoking history. He has never used smokeless tobacco. He reports that he does not drink alcohol or use drugs.  ROS: UROLOGY Frequent Urination?: Yes Hard to postpone urination?: No Burning/pain with urination?: No Get up at night to urinate?: Yes Leakage of urine?: No Urine stream starts and stops?: No Trouble starting stream?: No Do you have to strain to urinate?: No Blood in urine?: No Urinary tract infection?: Yes Sexually transmitted disease?:  No Injury to kidneys or bladder?: No Painful intercourse?: No Weak stream?: No Erection problems?: No Penile pain?: Yes  Gastrointestinal Nausea?: No Vomiting?: No Indigestion/heartburn?: No Diarrhea?: No Constipation?: No  Constitutional Fever: No Night sweats?: No Weight loss?: Yes Fatigue?: No  Skin Skin rash/lesions?: No Itching?: No  Eyes Blurred vision?: No Double vision?: No  Ears/Nose/Throat Sore throat?: No Sinus problems?: No  Hematologic/Lymphatic Swollen glands?: No Easy bruising?: No  Cardiovascular Leg swelling?: No Chest pain?: No  Respiratory Cough?: No  Shortness of breath?: No  Endocrine Excessive thirst?: No  Musculoskeletal Back pain?: Yes Joint pain?: No  Neurological Headaches?: No Dizziness?: No  Psychologic Depression?: No Anxiety?: No  Physical Exam: BP (!) 145/72 (BP Location: Left Arm, Patient Position: Sitting, Cuff Size: Normal)   Pulse 70   Ht 6' (1.829 m)   Wt 140 lb 11.2 oz (63.8 kg)   BMI 19.08 kg/m   Constitutional:  Alert and oriented, No acute distress. HEENT: [place] AT, moist mucus membranes.  Trachea midline, no masses. Cardiovascular: No clubbing, cyanosis, or edema. Respiratory: Normal respiratory effort, no increased work of breathing. GI: Abdomen is soft, nontender, nondistended, no abdominal masses GU: No CVA tenderness. Pedal prosthesis in place. Easily cycles with a good erection. No sign of infection. Skin: No rashes, bruises or suspicious lesions. Lymph: No cervical or inguinal adenopathy. Neurologic: Grossly intact, no focal deficits, moving all 4 extremities. Psychiatric: Normal mood and affect.  Laboratory Data: Lab Results  Component Value Date   WBC 6.0 09/13/2016   HGB 12.5 (L) 09/13/2016   HCT 36.4 (L) 09/13/2016   MCV 90.3 09/13/2016   PLT 299 09/13/2016    Lab Results  Component Value Date   CREATININE 0.99 09/13/2016    No results found for: PSA  No results found for:  TESTOSTERONE  Lab Results  Component Value Date   HGBA1C 5.2 02/29/2016    Urinalysis    Component Value Date/Time   COLORURINE AMBER (A) 09/05/2016 1133   APPEARANCEUR TURBID (A) 09/05/2016 1133   APPEARANCEUR Clear 10/08/2015 0857   LABSPEC 1.016 09/05/2016 1133   PHURINE 5.0 09/05/2016 1133   GLUCOSEU NEGATIVE 09/05/2016 1133   HGBUR NEGATIVE 09/05/2016 1133   BILIRUBINUR NEGATIVE 09/05/2016 1133   BILIRUBINUR Negative 10/08/2015 0857   KETONESUR NEGATIVE 09/05/2016 1133   PROTEINUR 100 (A) 09/05/2016 1133   NITRITE NEGATIVE 09/05/2016 1133   LEUKOCYTESUR MODERATE (A) 09/05/2016 1133   LEUKOCYTESUR Negative 10/08/2015 0857    Assessment & Plan:    1. Pyelonephritis Resolved. No further workup needed. Patient will contact the office if this becomes a recurrent problem.  2. Erectile dysfunction His 3 piece prosthesis is functioning well. Follow-up as needed.   Return if symptoms worsen or fail to improve.  Nickie Retort, MD  [facility] [address] [place], [place] [address] [phone]

## 2016-11-14 ENCOUNTER — Encounter: Payer: Self-pay | Admitting: *Deleted

## 2016-11-15 ENCOUNTER — Ambulatory Visit: Payer: BLUE CROSS/BLUE SHIELD | Admitting: Anesthesiology

## 2016-11-15 ENCOUNTER — Encounter
Admission: RE | Disposition: A | Discharge status: Home / Self Care | Payer: Self-pay | Source: Ambulatory Visit | Attending: Internal Medicine

## 2016-11-15 ENCOUNTER — Ambulatory Visit
Admission: RE | Admit: 2016-11-15 | Discharge: 2016-11-15 | Disposition: A | Discharge status: Home / Self Care | Payer: BLUE CROSS/BLUE SHIELD | Source: Ambulatory Visit | Attending: Internal Medicine | Admitting: Internal Medicine

## 2016-11-15 ENCOUNTER — Encounter: Payer: Self-pay | Admitting: *Deleted

## 2016-11-15 DIAGNOSIS — G2581 Restless legs syndrome: Secondary | ICD-10-CM | POA: Diagnosis not present

## 2016-11-15 DIAGNOSIS — D125 Benign neoplasm of sigmoid colon: Secondary | ICD-10-CM | POA: Insufficient documentation

## 2016-11-15 DIAGNOSIS — K5730 Diverticulosis of large intestine without perforation or abscess without bleeding: Secondary | ICD-10-CM | POA: Insufficient documentation

## 2016-11-15 DIAGNOSIS — I10 Essential (primary) hypertension: Secondary | ICD-10-CM | POA: Insufficient documentation

## 2016-11-15 DIAGNOSIS — K219 Gastro-esophageal reflux disease without esophagitis: Secondary | ICD-10-CM | POA: Diagnosis not present

## 2016-11-15 DIAGNOSIS — E785 Hyperlipidemia, unspecified: Secondary | ICD-10-CM | POA: Diagnosis not present

## 2016-11-15 DIAGNOSIS — D649 Anemia, unspecified: Secondary | ICD-10-CM | POA: Insufficient documentation

## 2016-11-15 DIAGNOSIS — Z86711 Personal history of pulmonary embolism: Secondary | ICD-10-CM | POA: Diagnosis not present

## 2016-11-15 DIAGNOSIS — Z87891 Personal history of nicotine dependence: Secondary | ICD-10-CM | POA: Diagnosis not present

## 2016-11-15 DIAGNOSIS — K648 Other hemorrhoids: Secondary | ICD-10-CM | POA: Insufficient documentation

## 2016-11-15 DIAGNOSIS — M1990 Unspecified osteoarthritis, unspecified site: Secondary | ICD-10-CM | POA: Insufficient documentation

## 2016-11-15 DIAGNOSIS — F419 Anxiety disorder, unspecified: Secondary | ICD-10-CM | POA: Insufficient documentation

## 2016-11-15 DIAGNOSIS — J45909 Unspecified asthma, uncomplicated: Secondary | ICD-10-CM | POA: Insufficient documentation

## 2016-11-15 DIAGNOSIS — Z7982 Long term (current) use of aspirin: Secondary | ICD-10-CM | POA: Diagnosis not present

## 2016-11-15 DIAGNOSIS — F329 Major depressive disorder, single episode, unspecified: Secondary | ICD-10-CM | POA: Diagnosis not present

## 2016-11-15 DIAGNOSIS — I2510 Atherosclerotic heart disease of native coronary artery without angina pectoris: Secondary | ICD-10-CM | POA: Diagnosis not present

## 2016-11-15 DIAGNOSIS — G8929 Other chronic pain: Secondary | ICD-10-CM | POA: Insufficient documentation

## 2016-11-15 DIAGNOSIS — Z79899 Other long term (current) drug therapy: Secondary | ICD-10-CM | POA: Diagnosis not present

## 2016-11-15 DIAGNOSIS — Z1211 Encounter for screening for malignant neoplasm of colon: Secondary | ICD-10-CM | POA: Diagnosis present

## 2016-11-15 HISTORY — DX: Other chronic pain: G89.29

## 2016-11-15 HISTORY — DX: Alcohol abuse, uncomplicated: F10.10

## 2016-11-15 HISTORY — PX: COLONOSCOPY WITH PROPOFOL: SHX5780

## 2016-11-15 SURGERY — COLONOSCOPY WITH PROPOFOL
Anesthesia: General

## 2016-11-15 MED ORDER — FENTANYL CITRATE (PF) 100 MCG/2ML IJ SOLN
INTRAMUSCULAR | Status: DC | PRN
  Administered 2016-11-15: 50 ug via INTRAVENOUS

## 2016-11-15 MED ORDER — PROPOFOL 500 MG/50ML IV EMUL
INTRAVENOUS | Status: DC | PRN
  Administered 2016-11-15: 150 ug/kg/min via INTRAVENOUS

## 2016-11-15 MED ORDER — MIDAZOLAM HCL 2 MG/2ML IJ SOLN
INTRAMUSCULAR | Status: DC | PRN
  Administered 2016-11-15: 2 mg via INTRAVENOUS

## 2016-11-15 MED ORDER — MIDAZOLAM HCL 2 MG/2ML IJ SOLN
INTRAMUSCULAR | Status: AC
  Filled 2016-11-15: qty 2

## 2016-11-15 MED ORDER — FENTANYL CITRATE (PF) 100 MCG/2ML IJ SOLN
INTRAMUSCULAR | Status: AC
  Filled 2016-11-15: qty 2

## 2016-11-15 MED ORDER — PROPOFOL 10 MG/ML IV BOLUS
INTRAVENOUS | Status: DC | PRN
  Administered 2016-11-15: 50 mg via INTRAVENOUS

## 2016-11-15 MED ORDER — SODIUM CHLORIDE 0.9 % IV SOLN
INTRAVENOUS | Status: DC
  Administered 2016-11-15: 13:00:00 via INTRAVENOUS

## 2016-11-15 NOTE — Anesthesia Post-op Follow-up Note (Signed)
Anesthesia QCDR form completed.        

## 2016-11-15 NOTE — Anesthesia Procedure Notes (Signed)
Date/Time: 11/15/2016 2:40 PM Performed by: Allean Found Pre-anesthesia Checklist: Patient identified, Emergency Drugs available, Suction available, Patient being monitored and Timeout performed Patient Re-evaluated:Patient Re-evaluated prior to induction Oxygen Delivery Method: Nasal cannula Placement Confirmation: positive ETCO2 Dental Injury: Teeth and Oropharynx as per pre-operative assessment

## 2016-11-15 NOTE — Transfer of Care (Signed)
Immediate Anesthesia Transfer of Care Note  Patient: Gerald Jefferson  Procedure(s) Performed: COLONOSCOPY WITH PROPOFOL (N/A )  Patient Location: PACU  Anesthesia Type:General  Level of Consciousness: awake, alert  and oriented  Airway & Oxygen Therapy: Patient Spontanous Breathing and Patient connected to nasal cannula oxygen  Post-op Assessment: Report given to RN and Post -op Vital signs reviewed and stable  Post vital signs: Reviewed and stable  Last Vitals:  Vitals:   11/15/16 1516 11/15/16 1517  BP:  97/68  Pulse:  79  Resp:  18  Temp: (P) 36.6 C 36.6 C  SpO2:  99%    Last Pain:  Vitals:   11/15/16 1516  TempSrc: (P) Tympanic         Complications: No apparent anesthesia complications

## 2016-11-15 NOTE — Anesthesia Preprocedure Evaluation (Signed)
Anesthesia Evaluation  Patient identified by MRN, date of birth, ID band Patient awake    Reviewed: Allergy & Precautions, H&P , NPO status , Patient's Chart, lab work & pertinent test results, reviewed documented beta blocker date and time   Airway Mallampati: II   Neck ROM: full    Dental  (+) Poor Dentition   Pulmonary neg pulmonary ROS, neg shortness of breath, asthma , former smoker,    Pulmonary exam normal        Cardiovascular Exercise Tolerance: Poor hypertension, On Medications + CAD  negative cardio ROS Normal cardiovascular exam Rhythm:regular Rate:Normal     Neuro/Psych TIA Neuromuscular disease negative neurological ROS  negative psych ROS   GI/Hepatic negative GI ROS, Neg liver ROS, GERD  Medicated,  Endo/Other  negative endocrine ROS  Renal/GU Renal diseasenegative Renal ROS  negative genitourinary   Musculoskeletal   Abdominal   Peds  Hematology negative hematology ROS (+) anemia ,   Anesthesia Other Findings Past Medical History: No date: Acid reflux No date: Alcohol abuse No date: Anemia No date: Anxiety No date: Arthritis No date: Asthma No date: Back pain     Comment:  lower No date: Chronic pain No date: Coronary artery disease     Comment:  a. remote PCI/DES to the LAD and OM in 2008; b. [facility] 2009              patent stents with mild RCA disease; c. lexiscan myoview               2018: intermediate risk with small sized, moderate in               severity mild inferoseptal defect that may have               represented artifact or scar. There was also a small               sized, mild in severity reversible defect involving the               mid and apical anterior segments seen only on attenuation              corrected imaged  No date: Depression 2008: DVT (deep venous thrombosis) ([facility])     Comment:  a. DVT-leg and lung; b. complicated by GI bleed on               triple therapy   No date: ED (erectile dysfunction) No date: Essential hypertension No date: GI bleed No date: Hyperlipidemia No date: Pulmonary embolism ([facility])     Comment:  a. complicated by GI on triple therapy No date: RLS (restless legs syndrome) Past Surgical History: 07/2007: CARDIAC CATHETERIZATION     Comment:  Patent LAD/OM stents. Stable 40% stenosis in proximal               RCA 04/2006: CORONARY ANGIOPLASTY WITH STENT PLACEMENT     Comment:  Cypher DES to LAD and OM1 No date: IVC FILTER PLACEMENT ([facility] HX) No date: JOINT REPLACEMENT No date: NOSE SURGERY 05/20/2015: PENILE PROSTHESIS IMPLANT; N/A     Comment:  Procedure: PENILE PROTHESIS INFLATABLE;  Surgeon: Nickie Retort, MD;  Location: [facility] ORS;  Service: Urology;              Laterality: N/A; 10/29/2015: PENILE PROSTHESIS IMPLANT; N/A     Comment:  Procedure: REVISION  PENILE PROTHESIS;  Surgeon: Nickie Retort, MD;  Location: [facility] ORS;  Service: Urology;              Laterality: N/A; 07/08/2015: PERIPHERAL VASCULAR CATHETERIZATION; N/A     Comment:  unsuccessful, was not removed, only attempted Procedure:              IVC Filter Removal;  Surgeon: Algernon Huxley, MD;  Location:              [place] [facility];  Service: Cardiovascular;                Laterality: N/A; No date: TOTAL HIP ARTHROPLASTY; Left BMI    Body Mass Index:  18.99 kg/m     Reproductive/Obstetrics negative OB ROS                             Anesthesia Physical Anesthesia Plan  ASA: III  Anesthesia Plan: General   Post-op Pain Management:    Induction:   PONV Risk Score and Plan:   Airway Management Planned:   Additional Equipment:   Intra-op Plan:   Post-operative Plan:   Informed Consent: I have reviewed the patients History and Physical, chart, labs and discussed the procedure including the risks, benefits and alternatives for the proposed anesthesia with the patient or authorized  representative who has indicated his/her understanding and acceptance.   Dental Advisory Given  Plan Discussed with: CRNA  Anesthesia Plan Comments:         Anesthesia Quick Evaluation

## 2016-11-15 NOTE — Op Note (Signed)
[facility] Gastroenterology Patient Name: Gerald Jefferson Procedure Date: 11/15/2016 2:36 PM MRN: [id number] Account #: [id number] Date of Birth: [birth year] Admit Type: Outpatient Age: 61 Room: [facility] ENDO ROOM 4 Gender: Male Note Status: Finalized Procedure:            Colonoscopy Indications:          Screening for colorectal malignant neoplasm Providers:            Benay Pike. [name] MD, MD Referring MD:         Venetia Maxon. Elijio Miles, MD (Referring MD) Medicines:            Propofol per Anesthesia Complications:        No immediate complications. Procedure:            Pre-Anesthesia Assessment:                       - ASA Grade Assessment: III - A patient with severe                        systemic disease.                       - After reviewing the risks and benefits, the patient                        was deemed in satisfactory condition to undergo the                        procedure.                       After obtaining informed consent, the colonoscope was                        passed under direct vision. Throughout the procedure,                        the patient's blood pressure, pulse, and oxygen                        saturations were monitored continuously. The                        Colonoscope was introduced through the anus and                        advanced to the the terminal ileum, with identification                        of the appendiceal orifice and IC valve. The                        colonoscopy was performed without difficulty. The                        patient tolerated the procedure well. The quality of                        the bowel preparation was good. The terminal ileum,  ileocecal valve, appendiceal orifice, and rectum were                        photographed. Findings:      The perianal and digital rectal examinations were normal. Pertinent       negatives include normal sphincter tone.      A few  small-mouthed diverticula were found in the sigmoid colon.      A 10 mm polyp was found in the sigmoid colon. The polyp was       semi-pedunculated. To prevent bleeding after the polypectomy, one       hemostatic clip was successfully placed. There was no bleeding at the       end of the procedure. The polyp was removed with a hot snare. Resection       and retrieval were complete.      Non-bleeding internal hemorrhoids were found during retroflexion. The       hemorrhoids were mild. Impression:           - Mild diverticulosis in the sigmoid colon.                       - One 10 mm polyp in the sigmoid colon, removed with a                        hot snare. Resected and retrieved. Clip was placed.                       - Non-bleeding internal hemorrhoids. Recommendation:       - Repeat colonoscopy for surveillance based on                        pathology results.                       - Return to GI office PRN.                       - Patient has a contact number available for                        emergencies. The signs and symptoms of potential                        delayed complications were discussed with the patient.                        Return to normal activities tomorrow. Written discharge                        instructions were provided to the patient.                       - Resume previous diet.                       - Continue present medications.                       - Discharge patient to home (with spouse). Procedure Code(s):    --- Professional ---  45385, Colonoscopy, flexible; with removal of tumor(s),                        polyp(s), or other lesion(s) by snare technique Diagnosis Code(s):    --- Professional ---                       Z12.11, Encounter for screening for malignant neoplasm                        of colon                       K64.8, Other hemorrhoids                       D12.5, Benign neoplasm of sigmoid colon                        K57.30, Diverticulosis of large intestine without                        perforation or abscess without bleeding CPT copyright 2016 American Medical Association. All rights reserved. The codes documented in this report are preliminary and upon coder review may  be revised to meet current compliance requirements. Efrain Sella MD, MD 11/15/2016 3:20:34 PM This report has been signed electronically. Number of Addenda: 0 Note Initiated On: 11/15/2016 2:36 PM Scope Withdrawal Time: 0 hours 7 minutes 24 seconds  Total Procedure Duration: 0 hours 25 minutes 35 seconds       [facility]

## 2016-11-15 NOTE — H&P (Signed)
Outpatient short stay form Pre-procedure 11/16/2016 2:36 PM Gerald Jefferson K. [name], M.D.  Primary Physician: Adrian Prows, M.D.  Reason for visit:  Colon cancer screening  History of present illness:  Pt presents for colon cancer screening. He has no significant change in bowel habits or rectal bleeding. No UGI symptoms.    No current facility-administered medications for this encounter.   Current Outpatient Prescriptions:  .  amLODipine (NORVASC) 5 MG tablet, Take 5 mg by mouth daily., Disp: , Rfl:  .  aspirin EC 81 MG tablet, Take 81 mg by mouth daily., Disp: , Rfl:  .  Avanafil (STENDRA) 200 MG TABS, Take 200 mg by mouth daily., Disp: , Rfl:  .  Azelastine HCl 0.15 % SOLN, 1 SPRAY/NARES DAILY, Disp: , Rfl: 0 .  budesonide-formoterol (SYMBICORT) 80-4.5 MCG/ACT inhaler, Inhale 2 puffs into the lungs 2 (two) times daily., Disp: , Rfl:  .  busPIRone (BUSPAR) 15 MG tablet, Take 15 mg by mouth 2 (two) times daily. , Disp: , Rfl: 3 .  butalbital-aspirin-caffeine (FIORINAL) 50-325-40 MG capsule, Take 1 capsule by mouth 2 (two) times daily as needed for headache., Disp: , Rfl:  .  clonazePAM (KLONOPIN) 1 MG tablet, Take 1 mg by mouth every morning., Disp: , Rfl: 1 .  cyclobenzaprine (FLEXERIL) 10 MG tablet, Take 10 mg by mouth 3 (three) times daily as needed for muscle spasms., Disp: , Rfl:  .  DULoxetine (CYMBALTA) 30 MG capsule, Take 30 mg by mouth daily., Disp: , Rfl:  .  ezetimibe (ZETIA) 10 MG tablet, Take 10 mg by mouth daily., Disp: , Rfl:  .  fexofenadine (ALLEGRA) 180 MG tablet, Take 180 mg by mouth daily., Disp: , Rfl:  .  fluticasone (FLONASE) 50 MCG/ACT nasal spray, Place 1 spray into both nostrils daily., Disp: , Rfl:  .  metoprolol tartrate (LOPRESSOR) 25 MG tablet, Take 25 mg by mouth 2 (two) times daily., Disp: , Rfl:  .  Potassium Gluconate 550 (90 K) MG TABS, Take 99 mg by mouth daily., Disp: , Rfl:  .  promethazine (PHENERGAN) 25 MG tablet, Take 25 mg by mouth every 6 (six)  hours as needed for nausea or vomiting., Disp: , Rfl:  .  rosuvastatin (CRESTOR) 20 MG tablet, Take 20 mg by mouth at bedtime. , Disp: , Rfl:  .  vitamin B-12 1000 MCG tablet, Take 1 tablet (1,000 mcg total) by mouth daily., Disp: 30 tablet, Rfl: 1 .  atorvastatin (LIPITOR) 10 MG tablet, Take 10 mg by mouth daily., Disp: , Rfl:  .  dexlansoprazole (DEXILANT) 60 MG capsule, Take 60 mg by mouth daily., Disp: , Rfl:  .  dronabinol (MARINOL) 5 MG capsule, Take 5 mg by mouth 2 (two) times daily., Disp: , Rfl: 1 .  ertapenem 1 g in sodium chloride 0.9 % 50 mL, Inject 1 g into the vein daily. (Patient not taking: Reported on 10/06/2016), Disp: , Rfl:  .  escitalopram (LEXAPRO) 10 MG tablet, Take 10 mg by mouth daily., Disp: , Rfl: 2 .  ferrous sulfate 325 (65 FE) MG tablet, Take 325 mg by mouth daily. , Disp: , Rfl:  .  gabapentin (NEURONTIN) 600 MG tablet, Take 600 mg by mouth 3 (three) times daily., Disp: , Rfl:  .  HYDROcodone-acetaminophen (NORCO) 10-325 MG tablet, Take 1 tablet by mouth every 6 (six) hours as needed. Takes qid daily, Disp: , Rfl:  .  omeprazole (PRILOSEC) 20 MG capsule, Take 1 capsule (20 mg total) by mouth daily.,  Disp: 30 capsule, Rfl: 6 .  phenazopyridine (PYRIDIUM) 100 MG tablet, Take 100 mg by mouth 2 (two) times daily., Disp: , Rfl: 0 .  PREVIDENT 5000 BOOSTER PLUS 1.1 % PSTE, Take 1 application by mouth 2 (two) times daily., Disp: , Rfl: 0 .  ramipril (ALTACE) 5 MG capsule, Take 5 mg by mouth daily., Disp: , Rfl:  .  rOPINIRole (REQUIP) 0.25 MG tablet, Take 0.25 mg by mouth at bedtime., Disp: , Rfl:  .  sertraline (ZOLOFT) 25 MG tablet, Take 25 mg by mouth daily., Disp: , Rfl:  .  tiZANidine (ZANAFLEX) 2 MG tablet, Take 2 mg by mouth at bedtime.  , Disp: , Rfl:   No prescriptions prior to admission.     No Known Allergies   Past Medical History:  Diagnosis Date  . Acid reflux   . Alcohol abuse   . Anemia   . Anxiety   . Arthritis   . Asthma   . Back pain     lower  . Chronic pain   . Coronary artery disease    a. remote PCI/DES to the LAD and OM in 2008; b. [facility] 2009 patent stents with mild RCA disease; c. lexiscan myoview 2018: intermediate risk with small sized, moderate in severity mild inferoseptal defect that may have represented artifact or scar. There was also a small sized, mild in severity reversible defect involving the mid and apical anterior segments seen only on attenuation corrected imaged   . Depression   . DVT (deep venous thrombosis) ([place]) 2008   a. DVT-leg and lung; b. complicated by GI bleed on triple therapy   . ED (erectile dysfunction)   . Essential hypertension   . GI bleed   . Hyperlipidemia   . Pulmonary embolism ([place])    a. complicated by GI on triple therapy  . RLS (restless legs syndrome)     Review of systems:      Physical Exam  General appearance: alert, cooperative and appears stated age Resp: clear to auscultation bilaterally Cardio: regular rate and rhythm, S1, S2 normal, no murmur, click, rub or gallop GI: soft, non-tender; bowel sounds normal; no masses,  no organomegaly     Planned procedures: Colonoscopy. The patient understands the nature of the planned procedure, indications, risks, alternatives and potential complications including but not limited to bleeding, infection, perforation, damage to internal organs and possible oversedation/side effects from anesthesia. The patient agrees and gives consent to proceed.  Please refer to procedure notes for findings, recommendations and patient disposition/instructions.    Gerald Jefferson K. [name], M.D. Gastroenterology 11/16/2016  2:36 PM

## 2016-11-16 ENCOUNTER — Encounter: Payer: Self-pay | Admitting: Internal Medicine

## 2016-11-16 NOTE — Anesthesia Postprocedure Evaluation (Signed)
Anesthesia Post Note  Patient: Gerald Jefferson  Procedure(s) Performed: COLONOSCOPY WITH PROPOFOL (N/A )  Patient location during evaluation: Endoscopy Anesthesia Type: General Level of consciousness: awake and alert and oriented Pain management: pain level controlled Vital Signs Assessment: post-procedure vital signs reviewed and stable Respiratory status: spontaneous breathing, nonlabored ventilation and respiratory function stable Cardiovascular status: blood pressure returned to baseline and stable Postop Assessment: no signs of nausea or vomiting Anesthetic complications: no     Last Vitals:  Vitals:   11/15/16 1536 11/15/16 1546  BP: 124/88 127/87  Pulse: 79 79  Resp: 14 20  Temp:    SpO2: 100% 100%    Last Pain:  Vitals:   11/16/16 0718  TempSrc:   PainSc: 0-No pain                 Charlisha Market

## 2016-11-17 LAB — SURGICAL PATHOLOGY

## 2017-02-09 ENCOUNTER — Other Ambulatory Visit: Payer: Self-pay | Admitting: Internal Medicine

## 2017-02-09 DIAGNOSIS — R51 Headache: Principal | ICD-10-CM

## 2017-02-09 DIAGNOSIS — R519 Headache, unspecified: Secondary | ICD-10-CM

## 2017-02-14 ENCOUNTER — Other Ambulatory Visit: Payer: Self-pay | Admitting: Internal Medicine

## 2017-02-14 DIAGNOSIS — R51 Headache: Principal | ICD-10-CM

## 2017-02-14 DIAGNOSIS — R519 Headache, unspecified: Secondary | ICD-10-CM

## 2017-02-15 ENCOUNTER — Ambulatory Visit
Admission: RE | Admit: 2017-02-15 | Discharge: 2017-02-15 | Disposition: A | Discharge status: Home / Self Care | Payer: BLUE CROSS/BLUE SHIELD | Source: Ambulatory Visit | Attending: Internal Medicine | Admitting: Internal Medicine

## 2017-03-16 ENCOUNTER — Telehealth: Payer: Self-pay | Admitting: Cardiovascular Disease

## 2017-03-16 NOTE — Telephone Encounter (Signed)
Error

## 2017-04-10 ENCOUNTER — Other Ambulatory Visit: Payer: Self-pay

## 2017-04-10 ENCOUNTER — Emergency Department
Admission: EM | Admit: 2017-04-10 | Discharge: 2017-04-10 | Disposition: A | Discharge status: Other Acute Inpatient Hospital | Payer: BLUE CROSS/BLUE SHIELD | Source: Home / Self Care | Attending: Emergency Medicine | Admitting: Emergency Medicine

## 2017-04-10 ENCOUNTER — Emergency Department
Admission: EM | Admit: 2017-04-10 | Discharge: 2017-04-10 | Disposition: A | Discharge status: Other Acute Inpatient Hospital | Payer: BLUE CROSS/BLUE SHIELD | Attending: Emergency Medicine | Admitting: Emergency Medicine

## 2017-04-10 ENCOUNTER — Encounter: Payer: Self-pay | Admitting: Emergency Medicine

## 2017-04-10 DIAGNOSIS — R040 Epistaxis: Secondary | ICD-10-CM

## 2017-04-10 DIAGNOSIS — R42 Dizziness and giddiness: Secondary | ICD-10-CM | POA: Diagnosis not present

## 2017-04-10 DIAGNOSIS — I129 Hypertensive chronic kidney disease with stage 1 through stage 4 chronic kidney disease, or unspecified chronic kidney disease: Secondary | ICD-10-CM

## 2017-04-10 DIAGNOSIS — J9589 Other postprocedural complications and disorders of respiratory system, not elsewhere classified: Secondary | ICD-10-CM

## 2017-04-10 DIAGNOSIS — N189 Chronic kidney disease, unspecified: Secondary | ICD-10-CM | POA: Insufficient documentation

## 2017-04-10 DIAGNOSIS — R55 Syncope and collapse: Secondary | ICD-10-CM | POA: Diagnosis not present

## 2017-04-10 DIAGNOSIS — Z9889 Other specified postprocedural states: Secondary | ICD-10-CM | POA: Diagnosis not present

## 2017-04-10 DIAGNOSIS — I959 Hypotension, unspecified: Secondary | ICD-10-CM

## 2017-04-10 DIAGNOSIS — R51 Headache: Secondary | ICD-10-CM | POA: Insufficient documentation

## 2017-04-10 DIAGNOSIS — Z87891 Personal history of nicotine dependence: Secondary | ICD-10-CM

## 2017-04-10 DIAGNOSIS — Z79899 Other long term (current) drug therapy: Secondary | ICD-10-CM | POA: Insufficient documentation

## 2017-04-10 DIAGNOSIS — T819XXA Unspecified complication of procedure, initial encounter: Secondary | ICD-10-CM

## 2017-04-10 DIAGNOSIS — I259 Chronic ischemic heart disease, unspecified: Secondary | ICD-10-CM

## 2017-04-10 DIAGNOSIS — J45909 Unspecified asthma, uncomplicated: Secondary | ICD-10-CM | POA: Insufficient documentation

## 2017-04-10 DIAGNOSIS — D649 Anemia, unspecified: Secondary | ICD-10-CM

## 2017-04-10 DIAGNOSIS — Z7982 Long term (current) use of aspirin: Secondary | ICD-10-CM

## 2017-04-10 DIAGNOSIS — R519 Headache, unspecified: Secondary | ICD-10-CM

## 2017-04-10 DIAGNOSIS — Z8673 Personal history of transient ischemic attack (TIA), and cerebral infarction without residual deficits: Secondary | ICD-10-CM | POA: Insufficient documentation

## 2017-04-10 DIAGNOSIS — N289 Disorder of kidney and ureter, unspecified: Secondary | ICD-10-CM

## 2017-04-10 DIAGNOSIS — I2510 Atherosclerotic heart disease of native coronary artery without angina pectoris: Secondary | ICD-10-CM | POA: Insufficient documentation

## 2017-04-10 LAB — CBC
HCT: 32.8 % — ABNORMAL LOW (ref 40.0–52.0)
Hemoglobin: 10.8 g/dL — ABNORMAL LOW (ref 13.0–18.0)
MCH: 30.2 pg (ref 26.0–34.0)
MCHC: 33.1 g/dL (ref 32.0–36.0)
MCV: 91.2 fL (ref 80.0–100.0)
Platelets: 272 10*3/uL (ref 150–440)
RBC: 3.59 MIL/uL — ABNORMAL LOW (ref 4.40–5.90)
RDW: 13.7 % (ref 11.5–14.5)
WBC: 17.1 10*3/uL — ABNORMAL HIGH (ref 3.8–10.6)

## 2017-04-10 LAB — PROTIME-INR
INR: 0.97
INR: 1.06
Prothrombin Time: 12.8 seconds (ref 11.4–15.2)
Prothrombin Time: 13.7 s (ref 11.4–15.2)

## 2017-04-10 LAB — BASIC METABOLIC PANEL
Anion gap: 10 (ref 5–15)
BUN: 30 mg/dL — AB (ref 6–20)
CALCIUM: 8.5 mg/dL — AB (ref 8.9–10.3)
CO2: 20 mmol/L — ABNORMAL LOW (ref 22–32)
CREATININE: 1.31 mg/dL — AB (ref 0.61–1.24)
Chloride: 112 mmol/L — ABNORMAL HIGH (ref 101–111)
GFR calc Af Amer: >60 mL/min (ref >60)
GFR calc non Af Amer: 56 mL/min — ABNORMAL LOW (ref >60)
Glucose, Bld: 182 mg/dL — ABNORMAL HIGH (ref 65–99)
Potassium: 4.2 mmol/L (ref 3.5–5.1)
SODIUM: 142 mmol/L (ref 135–145)

## 2017-04-10 LAB — CBC WITH DIFFERENTIAL/PLATELET
BASOS PCT: 1 %
Basophils Absolute: 0 10*3/uL (ref 0–0.1)
Eosinophils Absolute: 0.2 10*3/uL (ref 0–0.7)
Eosinophils Relative: 2 %
HEMATOCRIT: 39.3 % — AB (ref 40.0–52.0)
Hemoglobin: 13.3 g/dL (ref 13.0–18.0)
LYMPHS PCT: 10 %
Lymphs Abs: 1 10*3/uL (ref 1.0–3.6)
MCH: 30.3 pg (ref 26.0–34.0)
MCHC: 33.9 g/dL (ref 32.0–36.0)
MCV: 89.4 fL (ref 80.0–100.0)
MONOS PCT: 7 %
Monocytes Absolute: 0.7 10*3/uL (ref 0.2–1.0)
NEUTROS ABS: 7.4 10*3/uL — AB (ref 1.4–6.5)
NEUTROS PCT: 80 %
Platelets: 245 10*3/uL (ref 150–440)
RBC: 4.4 MIL/uL (ref 4.40–5.90)
RDW: 13.7 % (ref 11.5–14.5)
WBC: 9.3 10*3/uL (ref 3.8–10.6)

## 2017-04-10 LAB — COMPREHENSIVE METABOLIC PANEL
ALBUMIN: 3.6 g/dL (ref 3.5–5.0)
ALK PHOS: 86 U/L (ref 38–126)
ALT: 14 U/L — ABNORMAL LOW (ref 17–63)
AST: 16 U/L (ref 15–41)
Anion gap: 8 (ref 5–15)
BILIRUBIN TOTAL: 0.5 mg/dL (ref 0.3–1.2)
BUN: 24 mg/dL — ABNORMAL HIGH (ref 6–20)
CALCIUM: 8.9 mg/dL (ref 8.9–10.3)
CO2: 22 mmol/L (ref 22–32)
Chloride: 112 mmol/L — ABNORMAL HIGH (ref 101–111)
Creatinine, Ser: 0.96 mg/dL (ref 0.61–1.24)
GFR calc Af Amer: >60 mL/min (ref >60)
GLUCOSE: 121 mg/dL — AB (ref 65–99)
Potassium: 3.8 mmol/L (ref 3.5–5.1)
Sodium: 142 mmol/L (ref 135–145)
TOTAL PROTEIN: 6.9 g/dL (ref 6.5–8.1)

## 2017-04-10 LAB — APTT
aPTT: 37 seconds — ABNORMAL HIGH (ref 24–36)
aPTT: 29 s (ref 24–36)

## 2017-04-10 LAB — TROPONIN I: Troponin I: <0.03 ng/mL (ref <0.03)

## 2017-04-10 MED ORDER — SODIUM CHLORIDE 0.9 % IV BOLUS (SEPSIS)
1000.0000 mL | INTRAVENOUS | Status: AC
  Administered 2017-04-10: 1000 mL via INTRAVENOUS

## 2017-04-10 MED ORDER — SODIUM CHLORIDE 0.9 % IV BOLUS (SEPSIS)
1000.0000 mL | Freq: Once | INTRAVENOUS | Status: AC
  Administered 2017-04-10: 1000 mL via INTRAVENOUS

## 2017-04-10 MED ORDER — LIDOCAINE VISCOUS 2 % MT SOLN
15.0000 mL | Freq: Once | OROMUCOSAL | Status: AC
  Administered 2017-04-10: 15 mL via OROMUCOSAL
  Filled 2017-04-10: qty 15

## 2017-04-10 MED ORDER — MORPHINE SULFATE (PF) 4 MG/ML IV SOLN
4.0000 mg | Freq: Once | INTRAVENOUS | Status: AC
  Administered 2017-04-10: 4 mg via INTRAVENOUS
  Filled 2017-04-10: qty 1

## 2017-04-10 MED ORDER — TRANEXAMIC ACID 1000 MG/10ML IV SOLN
500.0000 mg | Freq: Once | INTRAVENOUS | Status: AC
  Administered 2017-04-10: 500 mg via TOPICAL
  Filled 2017-04-10: qty 10

## 2017-04-10 MED ORDER — ONDANSETRON HCL 4 MG/2ML IJ SOLN
4.0000 mg | Freq: Once | INTRAMUSCULAR | Status: AC
  Administered 2017-04-10: 4 mg via INTRAVENOUS

## 2017-04-10 MED ORDER — ONDANSETRON HCL 4 MG/2ML IJ SOLN
INTRAMUSCULAR | Status: AC
  Administered 2017-04-10: 4 mg via INTRAVENOUS
  Filled 2017-04-10: qty 2

## 2017-04-10 MED ORDER — MORPHINE SULFATE 4 MG/ML IJ SOLN
4.00 mg | INTRAMUSCULAR | Status: DC
Start: ? — End: 2017-04-10

## 2017-04-10 MED ORDER — HYDROMORPHONE HCL 1 MG/ML IJ SOLN
0.5000 mg | Freq: Once | INTRAMUSCULAR | Status: AC
Start: 2017-04-10 — End: 2017-04-10
  Administered 2017-04-10: 0.5 mg via INTRAVENOUS

## 2017-04-10 MED ORDER — ONDANSETRON HCL 4 MG/2ML IJ SOLN
4.0000 mg | INTRAMUSCULAR | Status: AC
  Administered 2017-04-10: 4 mg via INTRAVENOUS
  Filled 2017-04-10: qty 2

## 2017-04-10 MED ORDER — ONDANSETRON HCL 4 MG/2ML IJ SOLN
4.0000 mg | Freq: Once | INTRAMUSCULAR | Status: AC
  Administered 2017-04-10: 4 mg via INTRAVENOUS
  Filled 2017-04-10: qty 2

## 2017-04-10 MED ORDER — OXYMETAZOLINE HCL 0.05 % NA SOLN
1.0000 | Freq: Once | NASAL | Status: AC
  Administered 2017-04-10: 1 via NASAL
  Filled 2017-04-10: qty 15

## 2017-04-10 MED ORDER — HYDROMORPHONE HCL 1 MG/ML IJ SOLN
INTRAMUSCULAR | Status: AC
  Administered 2017-04-10: 0.5 mg via INTRAVENOUS
  Filled 2017-04-10: qty 1

## 2017-04-10 MED ORDER — FENTANYL CITRATE (PF) 100 MCG/2ML IJ SOLN
25.0000 ug | Freq: Once | INTRAMUSCULAR | Status: AC
  Administered 2017-04-10: 25 ug via INTRAVENOUS
  Filled 2017-04-10: qty 2

## 2017-04-10 MED ORDER — FENTANYL CITRATE (PF) 100 MCG/2ML IJ SOLN
50.0000 ug | Freq: Once | INTRAMUSCULAR | Status: AC
  Administered 2017-04-10: 50 ug via INTRAVENOUS

## 2017-04-10 NOTE — ED Triage Notes (Signed)
Pt arrived via EMS from home where EMS reports pt had a syncopal episode. 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 Pt was seen here in ED this AM and [facility] ED today as well for continued bleeding. Pt is A&O x4 on arrival with BP of 93/62. MD at bedside for further eval.

## 2017-04-10 NOTE — ED Triage Notes (Signed)
Patient has nasal surgery 03/30/17 and had a coughing spell and nose started bleeding about 30 PTA. Patient is in NAD, VS for EMS. Patient's nose is still bleeding at this time. Patient takes a baby asa everyday.

## 2017-04-10 NOTE — ED Notes (Addendum)
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

## 2017-04-10 NOTE — ED Provider Notes (Addendum)
[facility] Emergency Department Provider Note  ____________________________________________  Time seen: Approximately 9:25 PM  I have reviewed the triage vital signs and the nursing notes.   HISTORY  Chief Complaint Near Syncope and Epistaxis    HPI Gerald Jefferson is a 62 y.o. male presenting for nosebleed.  On 2/21, the patient underwent comp located ENT surgery at [facility] described as: 1. 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 Left sphenoidotomy  3. 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 Stereotactic computer assisted naviagtion, extradural  This morning, the patient was seen for left nare epistaxis with some hypotension and was transferred to [facility] after Merisel was placed.  When he was there, the patient reports that the [place] was removed and a dissolvable packing was placed into his nose.  The bleeding stopped and he was discharged home.  Tonight, the patient again began to have epistaxis mostly from the left nare.  He reports swallowing a large amount of blood and has developed some nausea without vomiting.  He continues to have a left-sided headache, which has been chronic since his surgery.  EMS noted that the patient had a good carotid pulse and was able to answer questions, but did have a near syncopal episode with sitting up.  He was placed in reverse Trendelenburg in route, and on arrival to the emergency department the patient has a blood pressure of 93/67 with a heart rate of 67.  Patient continues to feel poorly and a clamp has been placed on his nose.  This morning, the patient was seen for left nare epistaxis,  Past Medical History:  Diagnosis Date  . Acid reflux   . Alcohol abuse   . Anemia   . Anxiety   . Arthritis   . Asthma   . Back pain    lower  . Chronic pain   . Coronary artery disease    a. remote PCI/DES to the LAD and OM in 2008; b. [facility] 2009 patent stents with mild RCA disease; c. lexiscan myoview 2018:  intermediate risk with small sized, moderate in severity mild inferoseptal defect that may have represented artifact or scar. There was also a small sized, mild in severity reversible defect involving the mid and apical anterior segments seen only on attenuation corrected imaged   . Depression   . DVT (deep venous thrombosis) ([place]) 2008   a. DVT-leg and lung; b. complicated by GI bleed on triple therapy   . ED (erectile dysfunction)   . Essential hypertension   . GI bleed   . Hyperlipidemia   . Pulmonary embolism ([place])    a. complicated by GI on triple therapy  . RLS (restless legs syndrome)     Patient Active Problem List   Diagnosis Date Noted  . Pyelonephritis 08/21/2016  . Left sided numbness 02/29/2016  . Chest pressure 02/29/2016  . History of pulmonary embolism 02/29/2016  . S/P IVC filter 02/29/2016  . Chest pain with moderate risk for cardiac etiology   . Numbness and tingling of left side of face   . TIA (transient ischemic attack)   . Erectile dysfunction 05/20/2015  . Myositis 10/27/2014  . Myofasciitis 10/27/2014  . Renal insufficiency 10/27/2014  . Leukocytosis 10/27/2014  . Polyneuropathy 08/26/2014  . Hip pain 03/12/2012  . Coronary artery disease   . Hyperlipidemia   . Essential hypertension   . ED (erectile dysfunction)     Past Surgical History:  Procedure Laterality Date  . CARDIAC CATHETERIZATION  07/2007   Patent LAD/OM stents. Stable 40% stenosis in proximal RCA  . COLONOSCOPY WITH PROPOFOL N/A 11/15/2016   Procedure: COLONOSCOPY WITH PROPOFOL;  Surgeon: Toledo, Benay Pike, MD;  Location: [facility] ENDOSCOPY;  Service: Gastroenterology;  Laterality: N/A;  . CORONARY ANGIOPLASTY WITH STENT PLACEMENT  04/2006   [place] DES to LAD and OM1  . IVC FILTER PLACEMENT ([facility] HX)    . JOINT REPLACEMENT    . NOSE SURGERY    . PENILE PROSTHESIS IMPLANT N/A 05/20/2015   Procedure: PENILE PROTHESIS INFLATABLE;  Surgeon: Nickie Retort, MD;  Location: [facility] ORS;   Service: Urology;  Laterality: N/A;  . PENILE PROSTHESIS IMPLANT N/A 10/29/2015   Procedure: REVISION PENILE PROTHESIS;  Surgeon: Nickie Retort, MD;  Location: [facility] ORS;  Service: Urology;  Laterality: N/A;  . PERIPHERAL VASCULAR CATHETERIZATION N/A 07/08/2015   unsuccessful, was not removed, only attempted Procedure: IVC Filter Removal;  Surgeon: Algernon Huxley, MD;  Location: [place] [facility];  Service: Cardiovascular;  Laterality: N/A;  . TOTAL HIP ARTHROPLASTY Left     Current Outpatient Rx  . Order #: [id number] Class: Historical Med  . Order #: [id number] Class: Historical Med  . Order #: [id number] Class: Historical Med  . Order #: [id number] Class: Historical Med  . Order #: [id number] Class: Historical Med  . Order #: [id number] Class: Historical Med  . Order #: [id number] Class: Historical Med  . Order #: [id number] Class: Historical Med  . Order #: [id number] Class: Historical Med  . Order #: [id number] Class: Historical Med  . Order #: [id number] Class: Historical Med  . Order #: [id number] Class: Historical Med  . Order #: [id number] Class: Historical Med  . Order #: [id number] Class: No Print  . Order #: [id number] Class: Historical Med  . Order #: [id number] Class: Historical Med  . Order #: [id number] Class: Historical Med  . Order #: [id number] Class: Historical Med  . Order #: [id number] Class: Historical Med  . Order #: [id number] Class: Historical Med  . Order #: [id number] Class: Historical Med  . Order #: [id number] Class: Historical Med  . Order #: [id number] Class: Normal  . Order #: [id number] Class: Historical Med  . Order #: [id number] Class: Historical Med  . Order #: [id number] Class: Historical Med  . Order #: [id number] Class: Historical Med  . Order #: [id number] Class: Historical Med  . Order #: [id number] Class: Historical Med  . Order #: [id number] Class: Historical Med  . Order #: [id number] Class: Historical Med  . Order #: [id number] Class: Historical Med  . Order #: [id number] Class: Normal     Allergies Patient has no known allergies.  Family History  Problem Relation Age of Onset  . Hypertension Mother   . Kidney cancer Neg Hx   . Kidney disease Neg Hx   . Prostate cancer Neg Hx     Social History Social History   Tobacco Use  . Smoking status: Former Smoker    Packs/day: 1.50    Years: 30.00    Pack years: 45.00    Types: Cigarettes    Last attempt to quit: 04/08/2006    Years since quitting: 11.0  . Smokeless tobacco: Never Used  Substance Use Topics  . Alcohol use: No    Comment: HX.OF ALCOHOL ABUSE  . Drug use: No    Review of Systems Constitutional: No fever/chills.  Positive lightheadedness with presyncope. Eyes: No visual changes. ENT: No sore throat. No congestion or rhinorrhea.  Positive left nare significant epistaxis. Cardiovascular: Denies chest pain. Denies palpitations. Respiratory: Denies shortness of breath.  No cough. Gastrointestinal: No abdominal pain.  Positive nausea, no vomiting.  No diarrhea.  No constipation. Genitourinary: Negative for dysuria. Musculoskeletal: Negative for back pain. Skin: Negative for rash. Neurological: Positive for headaches. No focal numbness, tingling or weakness.     ____________________________________________   PHYSICAL EXAM:  VITAL SIGNS: ED Triage Vitals  Enc Vitals Group     BP 04/10/17 2118 93/67     Pulse Rate 04/10/17 2118 67     Resp 04/10/17 2118 16     Temp 04/10/17 2118 97.7 F (36.5 C)     Temp Source 04/10/17 2118 Oral     SpO2 04/10/17 2118 100 %     Weight 04/10/17 2117 152 lb (68.9 kg)     Height --      Head Circumference --      Peak Flow --      Pain Score 04/10/17 2117 0     Pain Loc --      Pain Edu? --      Excl. in [place]? --     Constitutional: Alert and oriented.  Uncomfortable appearing and feels worse with sitting up, but nontoxic.   Answers questions appropriately. Eyes: Conjunctivae are normal.  EOMI. No scleral icterus. Head: Atraumatic. Nose: The patient  has a large blood clot in the right nare with erythematous mucous membranes but no evidence of active bleeding.  In the left nare, he has some mild bleeding without a clear source on examination. Mouth/Throat: Mucous membranes are dry.  The patient has dry crusted blood in the mouth..  Neck: No stridor.  Supple.   Cardiovascular: Normal rate, regular rhythm. No murmurs, rubs or gallops.  Positive hypotension on my exam. Respiratory: Normal respiratory effort.  No accessory muscle use or retractions. Lungs CTAB.  No wheezes, rales or ronchi. Musculoskeletal: No LE edema. Neurologic:  A&Ox3.  Speech is clear.  Face and smile are symmetric.  EOMI.  Moves all extremities well. Skin:  Skin is warm, dry and intact. No rash noted. Psychiatric: Mood and affect are normal. ____________________________________________   LABS (all labs ordered are listed, but only abnormal results are displayed)  Labs Reviewed  CBC - Abnormal; Notable for the following components:      Result Value   WBC 17.1 (*)    RBC 3.59 (*)    Hemoglobin 10.8 (*)    HCT 32.8 (*)    All other components within normal limits  BASIC METABOLIC PANEL - Abnormal; Notable for the following components:   Chloride 112 (*)    CO2 20 (*)    Glucose, Bld 182 (*)    BUN 30 (*)    Creatinine, Ser 1.31 (*)    Calcium 8.5 (*)    GFR calc non Af Amer 56 (*)    All other components within normal limits  PROTIME-INR  APTT  TROPONIN I  TYPE AND SCREEN  TYPE AND SCREEN   ____________________________________________  EKG  ED ECG REPORT I, Eula Listen, the attending physician, personally viewed and interpreted this ECG.   Date: 04/10/2017  EKG Time: 2121  Rate: 84  Rhythm: normal sinus rhythm  Axis: normal  Intervals:none  ST&T Change: No STEMI  ED ECG REPORT I, Eula Listen, the attending physician, personally viewed and interpreted this ECG.   Date: 04/10/2017  EKG Time: 2221  Rate: 84  Rhythm: normal  sinus rhythm  Axis: normal  Intervals:none  ST&T Change: No STEMI  ____________________________________________  RADIOLOGY  No results found.  ____________________________________________   PROCEDURES  Procedure(s) performed: None  .Epistaxis Management Date/Time: 04/10/2017 9:57 PM  Performed by: Eula Listen, MD Authorized by: Eula Listen, MD   Consent:    Consent obtained:  Verbal   Consent given by:  Patient   Risks discussed:  Bleeding, infection, nasal injury and pain   Alternatives discussed:  No treatment Procedure details:    Treatment site:  L posterior   Treatment episode: recurring   Post-procedure details:    Assessment:  Bleeding decreased   Patient tolerance of procedure:  Tolerated well, no immediate complications Comments:     Rhino Rocket placed in L nare, pt tolerated procedure well w/o complications.     Critical Care performed: yes ____________________________________________   INITIAL IMPRESSION / ASSESSMENT AND PLAN / ED COURSE  Pertinent labs & imaging results that were available during my care of the patient were reviewed by me and considered in my medical decision making (see chart for details).  62 y.o. male presenting with epistaxis from the left nare and associated hypotension, headache, nausea.  Overall, I am concerned about anemia or hypovolemia, although vagal response to the patient's pain and bleeding could also cause his hypotension.  The patient has multiple large bore peripheral IVs with intravenous fluid infusing at this time.  I have placed a clamp over his nose, although I am not able to see the source of the bleeding and it is possible that a anterior clip will not reach the source of bleeding.  I have made a call to [facility] for transfer, and will discuss replacing the Merocel packing which the patient had earlier today with them when they call back.  In the meantime, we will continue to monitor the patient's  mental status, vital signs, and treat his pain and nausea.  I am waiting the results of his laboratory studies.    ----------------------------------------- 9:32 PM on 04/10/2017 -----------------------------------------  The patient does have a new anemia with a hemoglobin of 10.8 from over 13 this morning.  He also has an elevated white blood cell count of 17.1.  If I am able to stabilize him with intravenous fluids, his hemoglobin is sufficiently high that acute emergency transfusion is not indicated at this time but will continue to follow his clinical course closely.  ----------------------------------------- 9:46 PM on 04/10/2017 -----------------------------------------  The patient has been accepted to [facility] for transfer.  I spoken directly with Dr. Mardee Postin but there are no surgical beds available at this time.  We will do an ED to ED transfer and the patient has been accepted to the ED physician Dr. Leilani Merl.  Dr. Mardee Postin has recommended placement of a Rhino Rocket for hemostasis.Marland Kitchen  ----------------------------------------- 10:00 PM on 04/10/2017 -----------------------------------------  Rhino Rocket was placed in the left nare and the patient tolerated the procedure well.  His blood pressure has significantly improved to the 120s at this time.  His headache improved with fentanyl, and we have redosed him for continued pain management.  I have spoken with the patient and his family about the plan for transfer and there are in agreement and were able to ask questions.  ----------------------------------------- 10:44 PM on 04/10/2017 -----------------------------------------  I was called to the bedside because the patient was having chest discomfort, but when I asked him about it, it was that he was feeling short of breath because he could not breathe from his nose due to the packing and clamp.  I encouraged him to breathe from his mouth.  His O2 sats remained in the mid to high 90s on  room air, and he continues to  be hemodynamically stable.  I did repeat EKG which is above, but there are no ischemic changes and I do not think he has a new ACS or MI.  His blood pressure has continued to remain stable as he is completing his 2 L of fluid.  The patient will be signed out to the oncoming physician, who will continue to monitor him until he is transferred to [facility].   CRITICAL CARE Performed by: Eula Listen   Total critical care time: 45 minutes  Critical care time was exclusive of separately billable procedures and treating other patients.  Critical care was necessary to treat or prevent imminent or life-threatening deterioration.  Critical care was time spent personally by me on the following activities: development of treatment plan with patient and/or surrogate as well as nursing, discussions with consultants, evaluation of patient's response to treatment, examination of patient, obtaining history from patient or surrogate, ordering and performing treatments and interventions, ordering and review of laboratory studies, ordering and review of radiographic studies, pulse oximetry and re-evaluation of patient's condition.  ____________________________________________  FINAL CLINICAL IMPRESSION(S) / ED DIAGNOSES  Final diagnoses:  Hypotension, unspecified hypotension type  Symptomatic anemia  Postural dizziness with presyncope  Acute posterior epistaxis  Acute renal insufficiency         NEW MEDICATIONS STARTED DURING THIS VISIT:  New Prescriptions   No medications on file      Eula Listen, MD 04/10/17 2147    Eula Listen, MD 04/10/17 2158    Eula Listen, MD 04/10/17 2200    Eula Listen, MD 04/10/17 2245    Eula Listen, MD 04/10/17 2247    Eula Listen, MD 04/10/17 [phone]

## 2017-04-10 NOTE — ED Notes (Signed)
EMTALA checked for completion  

## 2017-04-10 NOTE — ED Notes (Signed)
Pt c/o generalized chest pain, MD aware at bedside at this time.

## 2017-04-10 NOTE — ED Provider Notes (Addendum)
[facility] Emergency Department Provider Note  ____________________________________________   First MD Initiated Contact with Patient 04/10/17 [phone]     (approximate)  I have reviewed the triage vital signs and the nursing notes.   HISTORY  Chief Complaint Epistaxis    HPI Gerald Jefferson is a 62 y.o. male with a history of recent complicated ENT surgery at [facility].  He presents by EMS tonight for evaluation of acute onset left-sided epistaxis.  He reports that he has not been bleeding since his surgery (performed by Dr. Mardee Postin about 10 days ago on 2/21), but earlier this evening he had acute onset of bleeding from the left naris with also acute onset of left-sided facial pain and pain behind the eyes.  This type of pain is similar to what he had before the surgery but it got much better but is acutely worse and severe tonight.  He was able to control the bleeding initially with direct pressure but then it started up again while he was resting overnight.  He reports that the bleeding was severe and difficult to control.  He states that he had no choice but to call EMS due to the amount of blood he was losing.  They were unable to take him to [facility] as per their policy.  A nose clamp was placed on him upon arrival and he was still bleeding down the back of his throat but it was a relatively small amount compared to prior.  Given his recent complicated surgery I called my local ENT provider for advice before proceeding.  See hospital course for details  The patient denies fever/chills, chest pain, shortness of breath except for when the blood is pouring down his throat, vomiting, and abdominal pain.  He has had some nausea.  He reports severe left-sided facial pain and headache similar to prior but much worse.  He denies any neck pain or neck stiffness.  He was not having any clear rhinorrhea prior to the onset of the nosebleed.  Overall he describes his symptoms as severe;  direct pressure helps the bleeding a little bit although it does not stop it, and nothing in particular makes it worse.  He denies taking any anticoagulation.   Past Medical History:  Diagnosis Date  . Acid reflux   . Alcohol abuse   . Anemia   . Anxiety   . Arthritis   . Asthma   . Back pain    lower  . Chronic pain   . Coronary artery disease    a. remote PCI/DES to the LAD and OM in 2008; b. [facility] 2009 patent stents with mild RCA disease; c. lexiscan myoview 2018: intermediate risk with small sized, moderate in severity mild inferoseptal defect that may have represented artifact or scar. There was also a small sized, mild in severity reversible defect involving the mid and apical anterior segments seen only on attenuation corrected imaged   . Depression   . DVT (deep venous thrombosis) ([place]) 2008   a. DVT-leg and lung; b. complicated by GI bleed on triple therapy   . ED (erectile dysfunction)   . Essential hypertension   . GI bleed   . Hyperlipidemia   . Pulmonary embolism ([place])    a. complicated by GI on triple therapy  . RLS (restless legs syndrome)     Patient Active Problem List   Diagnosis Date Noted  . Pyelonephritis 08/21/2016  . Left sided numbness 02/29/2016  . Chest pressure 02/29/2016  .  History of pulmonary embolism 02/29/2016  . S/P IVC filter 02/29/2016  . Chest pain with moderate risk for cardiac etiology   . Numbness and tingling of left side of face   . TIA (transient ischemic attack)   . Erectile dysfunction 05/20/2015  . Myositis 10/27/2014  . Myofasciitis 10/27/2014  . Renal insufficiency 10/27/2014  . Leukocytosis 10/27/2014  . Polyneuropathy 08/26/2014  . Hip pain 03/12/2012  . Coronary artery disease   . Hyperlipidemia   . Essential hypertension   . ED (erectile dysfunction)     Past Surgical History:  Procedure Laterality Date  . CARDIAC CATHETERIZATION  07/2007   Patent LAD/OM stents. Stable 40% stenosis in proximal RCA  . COLONOSCOPY  WITH PROPOFOL N/A 11/15/2016   Procedure: COLONOSCOPY WITH PROPOFOL;  Surgeon: Toledo, Benay Pike, MD;  Location: [facility] ENDOSCOPY;  Service: Gastroenterology;  Laterality: N/A;  . CORONARY ANGIOPLASTY WITH STENT PLACEMENT  04/2006   [place] DES to LAD and OM1  . IVC FILTER PLACEMENT ([facility] HX)    . JOINT REPLACEMENT    . NOSE SURGERY    . PENILE PROSTHESIS IMPLANT N/A 05/20/2015   Procedure: PENILE PROTHESIS INFLATABLE;  Surgeon: Nickie Retort, MD;  Location: [facility] ORS;  Service: Urology;  Laterality: N/A;  . PENILE PROSTHESIS IMPLANT N/A 10/29/2015   Procedure: REVISION PENILE PROTHESIS;  Surgeon: Nickie Retort, MD;  Location: [facility] ORS;  Service: Urology;  Laterality: N/A;  . PERIPHERAL VASCULAR CATHETERIZATION N/A 07/08/2015   unsuccessful, was not removed, only attempted Procedure: IVC Filter Removal;  Surgeon: Algernon Huxley, MD;  Location: [facility];  Service: Cardiovascular;  Laterality: N/A;  . TOTAL HIP ARTHROPLASTY Left     Prior to Admission medications   Medication Sig Start Date End Date Taking? Authorizing Provider  amLODipine (NORVASC) 5 MG tablet Take 5 mg by mouth daily.    [provider]  aspirin EC 81 MG tablet Take 81 mg by mouth daily.    [provider]  atorvastatin (LIPITOR) 10 MG tablet Take 10 mg by mouth daily.    [provider]  Avanafil (STENDRA) 200 MG TABS Take 200 mg by mouth daily.    [provider]  Azelastine HCl 0.15 % SOLN 1 SPRAY/NARES DAILY 03/18/15   [provider]  budesonide-formoterol (SYMBICORT) 80-4.5 MCG/ACT inhaler Inhale 2 puffs into the lungs 2 (two) times daily.    [provider]  busPIRone (BUSPAR) 15 MG tablet Take 15 mg by mouth 2 (two) times daily.  04/13/15   [provider]  butalbital-aspirin-caffeine Acquanetta Chain) 50-325-40 MG capsule Take 1 capsule by mouth 2 (two) times daily as needed for headache.    [provider]  clonazePAM (KLONOPIN) 1 MG tablet  Take 1 mg by mouth every morning. 07/23/16   [provider]  cyclobenzaprine (FLEXERIL) 10 MG tablet Take 10 mg by mouth 3 (three) times daily as needed for muscle spasms.    [provider]  dexlansoprazole (DEXILANT) 60 MG capsule Take 60 mg by mouth daily.    [provider]  dronabinol (MARINOL) 5 MG capsule Take 5 mg by mouth 2 (two) times daily. 07/18/16   [provider]  DULoxetine (CYMBALTA) 30 MG capsule Take 30 mg by mouth daily.    [provider]  ertapenem 1 g in sodium chloride 0.9 % 50 mL Inject 1 g into the vein daily. Patient not taking: Reported on 10/06/2016 09/09/16   Demetrios Loll, MD  escitalopram [facility]) 10  MG tablet Take 10 mg by mouth daily. 08/09/16   [provider]  ezetimibe (ZETIA) 10 MG tablet Take 10 mg by mouth daily.    [provider]  ferrous sulfate 325 (65 FE) MG tablet Take 325 mg by mouth daily.     [provider]  fexofenadine (ALLEGRA) 180 MG tablet Take 180 mg by mouth daily.    [provider]  fluticasone (FLONASE) 50 MCG/ACT nasal spray Place 1 spray into both nostrils daily.    [provider]  gabapentin (NEURONTIN) 600 MG tablet Take 600 mg by mouth 3 (three) times daily.    [provider]  HYDROcodone-acetaminophen (NORCO) 10-325 MG tablet Take 1 tablet by mouth every 6 (six) hours as needed. Takes qid daily    [provider]  metoprolol tartrate (LOPRESSOR) 25 MG tablet Take 25 mg by mouth 2 (two) times daily.    [provider]  omeprazole (PRILOSEC) 20 MG capsule Take 1 capsule (20 mg total) by mouth daily. 08/29/11   Wellington Hampshire, MD  phenazopyridine (PYRIDIUM) 100 MG tablet Take 100 mg by mouth 2 (two) times daily. 08/18/16   [provider]  Potassium Gluconate 550 (90 K) MG TABS Take 99 mg by mouth daily.    [provider]  PREVIDENT 5000 BOOSTER PLUS 1.1 % PSTE Take 1 application by mouth 2 (two) times  daily. 08/11/16   [provider]  promethazine (PHENERGAN) 25 MG tablet Take 25 mg by mouth every 6 (six) hours as needed for nausea or vomiting.    [provider]  ramipril (ALTACE) 5 MG capsule Take 5 mg by mouth daily.    [provider]  rOPINIRole (REQUIP) 0.25 MG tablet Take 0.25 mg by mouth at bedtime.    [provider]  rosuvastatin (CRESTOR) 20 MG tablet Take 20 mg by mouth at bedtime.     [provider]  sertraline (ZOLOFT) 25 MG tablet Take 25 mg by mouth daily.    [provider]  tiZANidine (ZANAFLEX) 2 MG tablet Take 2 mg by mouth at bedtime.      [provider]  vitamin B-12 1000 MCG tablet Take 1 tablet (1,000 mcg total) by mouth daily. 10/30/14   [name], MD    Allergies Patient has no known allergies.  Family History  Problem Relation Age of Onset  . Hypertension Mother   . Kidney cancer Neg Hx   . Kidney disease Neg Hx   . Prostate cancer Neg Hx     Social History Social History   Tobacco Use  . Smoking status: Former Smoker    Packs/day: 1.50    Years: 30.00    Pack years: 45.00    Types: Cigarettes    Last attempt to quit: 04/08/2006    Years since quitting: 11.0  . Smokeless tobacco: Never Used  Substance Use Topics  . Alcohol use: No    Comment: HX.OF ALCOHOL ABUSE  . Drug use: No    Review of Systems Constitutional: No fever/chills Eyes: No visual changes. ENT: Acute onset severe left-sided epistaxis approximately 10 days postop. Cardiovascular: Denies chest pain. Respiratory: Denies shortness of breath. Gastrointestinal: No abdominal pain.  Nausea, no vomiting.  No diarrhea.  No constipation. Genitourinary: Negative for dysuria. Musculoskeletal: Negative for neck pain.  Negative for back pain. Integumentary: Negative for rash. Neurological: Left sided facial pain and headache.  No focal numbness nor weakness.  ____________________________________________   PHYSICAL  EXAM:  VITAL SIGNS: ED Triage Vitals  Enc Vitals Group     BP 04/10/17 0254 98/81     Pulse Rate 04/10/17 0254 76     Resp 04/10/17 0310 18     Temp 04/10/17 0254 98.3 F (36.8 C)     Temp Source 04/10/17 0254 Axillary     SpO2 04/10/17 0254 99 %     Weight 04/10/17 0256 68.9 kg (152 lb)     Height 04/10/17 0256 1.829 m (6')     Head Circumference --      Peak Flow --      Pain Score 04/10/17 0256 6     Pain Loc --      Pain Edu? --      Excl. in [place]? --     Constitutional: Alert and oriented.  Generally well-appearing but does appear uncomfortable with nose clip in place and extensive dried blood on his face and neck and hands Eyes: Conjunctivae are normal. PERRL. EOMI. Head: Atraumatic. Nose: Left-sided epistaxis, unable to visualize any particular landmarks or abnormalities due to the amount of blood present.  Clots are present and after they were removed with gentle blowing the somewhat brisk bleeding resumed. Mouth/Throat: Mucous membranes are moist.  Extensive dried blood in the posterior oropharynx. Neck: No stridor.  No meningeal signs.   Cardiovascular: Normal rate, regular rhythm. Good peripheral circulation. Grossly normal heart sounds. Respiratory: Normal respiratory effort.  No retractions. Lungs CTAB. Musculoskeletal: No lower extremity tenderness nor edema. No gross deformities of extremities. Neurologic:  Normal speech and language. No gross focal neurologic deficits are appreciated.  Skin:  Skin is warm, dry and intact. No rash noted. Psychiatric: Mood and affect are normal. Speech and behavior are normal.  ____________________________________________   LABS (all labs ordered are listed, but only abnormal results are displayed)  Labs Reviewed  CBC WITH DIFFERENTIAL/PLATELET - Abnormal; Notable for the following components:      Result Value   HCT 39.3 (*)    Neutro Abs 7.4 (*)    All other components within normal limits  COMPREHENSIVE METABOLIC PANEL -  Abnormal; Notable for the following components:   Chloride 112 (*)    Glucose, Bld 121 (*)    BUN 24 (*)    ALT 14 (*)    All other components within normal limits  APTT - Abnormal; Notable for the following components:   aPTT 37 (*)    All other components within normal limits  PROTIME-INR   ____________________________________________  EKG  None - EKG not ordered by ED physician ____________________________________________  RADIOLOGY   ED MD interpretation: No imaging indicated emergently  Official radiology report(s): No results found.  ____________________________________________   PROCEDURES  Critical Care performed: No   Procedure(s) performed:   .Epistaxis Management Date/Time: 04/10/2017 5:57 AM Performed by: Hinda Kehr, MD Authorized by: Hinda Kehr, MD   Consent:    Consent obtained:  Verbal   Consent given by:  Patient   Risks discussed:  Bleeding, infection, nasal injury and pain Procedure details:    Treatment site:  L posterior   Treatment method:  Merocel sponge (coated with viscous lidocaine, injected with TXA)   Treatment complexity:  Limited   Treatment episode: initial   Post-procedure details:    Assessment:  Bleeding stopped   Patient tolerance of procedure:  Tolerated well, no immediate complications      ____________________________________________   INITIAL IMPRESSION / ASSESSMENT AND PLAN / ED COURSE  As part of my medical  decision making, I reviewed the following data within the [place] notes reviewed and incorporated, Labs reviewed , Old chart reviewed and A phone consult was requested and obtained from this/these consultant(s) ENT  (Dr. Tami Ribas and Dr. Jerilee Hoh at [facility])   Differential diagnosis includes, but is not limited to, postoperative bleeding, postoperative infection, CSF leak, etc.  Given the complicated nature of the patient's recent surgery I am reluctant to place a sponge or Rapid  Rhino until I gone over the surgery with our local ENT specialist.  The patient's procedures include (as per the operative note in [place]):    1. 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 Left sphenoidotomy  3. 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 Stereotactic computer assisted naviagtion, extradural   Paging Dr. Tami Ribas to discuss.  Ordering labs, IV, analgesia, antiemetic.  Blood pressure is a little bit on the hypertensive side with systolic pressures consistently in the mid to upper 90s.  I will monitor but I am reluctant to order a fluid bolus given his otherwise stable tachycardia and my desire to not dilute down his hemoglobin any lower.  He is uncomfortable but not in severe distress although he is reporting severe left-sided facial pain.  Clinical Course as of Apr 10 608  Mon Apr 10, 2017  0347 Called and discussed the case with Dr. Tami Ribas by phone.  I went over the operative note with him in detail.  He advised me that it should be safe for me to place a Merocel sponge to temporize the acute bleeding, but he is strongly advised me to call [facility] to arrange transfer.  He advised that this was a very extensive surgery involving his skull base and the possibility of arterial involvement is high.  I just finished my initial discussion with the [facility] logistics center and will receive a call back to speak both with ENT and with the emergency department.  [CF]  [id number] I spoke by phone with a Dr. Jerilee Hoh with [facility].  He reviewed the history and said that it would be okay to go ahead and place a Merocel.  He suggested that if the bleeding has stopped and the patient is feeling better and is comfortable with discharge and outpatient follow-up, to Jerilee Hoh would help arrange close follow-up this week.  If the patient is not comfortable with that plan or is having worsening or persistent symptoms then he suggested ED to ED transfer.The patient still had some trickling  left-sided epistaxis with the nose clip in place.  I placed the Merocel as documented above using TXA to expand the sponge.  He tolerated the procedure well but even prior to the surgery he was complaining of worsening left-sided facial pain and headache.  Morphine did not provide him much if any relief.  His blood pressure is consistently relatively low at 93/71.  He is not at all comfortable with the plan for discharge and outpatient follow-up and I agree that he needs evaluation at [facility].  I have contacted the [facility] logistics center again to discuss ED to ED transfer.  [CF]  0507 Spoke by phone with Dr. Hubbard Hartshorn at [facility].  Discussed the case and he agrees to accept the patient.  We are arranging transport by [facility].  Patient is stable, still reporting left facial pain.  No additional bleeding at this time.  [CF]  3762 Of note, I did not provide any prophylactic or empiric antibiotics as I feel this would  be better ordered and evaluated by the ENT specialist at [facility].  I do not anticipate that the Merocel will stay in long enough to become a nidus of infection.  [CF]  0607 Delayed documentation of hemoglobin which is 13.3 but in the setting of acute bleeding I would not anticipate it would be low yet.  I do anticipate it will drop by the time it is repeated.  [CF]    Clinical Course User Index [CF] Hinda Kehr, MD    ____________________________________________  FINAL CLINICAL IMPRESSION(S) / ED DIAGNOSES  Final diagnoses:  Left-sided epistaxis  Left facial pain  Complication of procedure, initial encounter     MEDICATIONS GIVEN DURING THIS VISIT:  Medications  tranexamic acid (CYKLOKAPRON) injection 500 mg (500 mg Topical Given 04/10/17 0350)  oxymetazoline (AFRIN) 0.05 % nasal spray 1 spray (1 spray Each Nare Given 04/10/17 0342)  lidocaine (XYLOCAINE) 2 % viscous mouth solution 15 mL (15 mLs Mouth/Throat Given 04/10/17 0342)  morphine 4 MG/ML injection 4 mg (4 mg Intravenous Given  04/10/17 0342)  ondansetron (ZOFRAN) injection 4 mg (4 mg Intravenous Given 04/10/17 0342)  sodium chloride 0.9 % bolus 1,000 mL (0 mLs Intravenous Stopped 04/10/17 0602)  morphine 4 MG/ML injection 4 mg (4 mg Intravenous Given 04/10/17 0532)     ED Discharge Orders    None       Note:  This document was prepared using Dragon voice recognition software and may include unintentional dictation errors.    Hinda Kehr, MD 04/10/17 5170    Hinda Kehr, MD 04/10/17 [phone]

## 2017-04-27 ENCOUNTER — Telehealth: Payer: Self-pay | Admitting: Cardiovascular Disease

## 2017-04-27 NOTE — Telephone Encounter (Signed)
Wife wanted to let us know patient is still of his aspirin. Had nasal polyp surgery on February 21st.  Been off aspirin since February 18th. Patient had main artery in sinus cavity hemorrhage 10 days after removal of nasal polyp. No cardiac symptoms at this time. Patient has f/u with Dr Mardee Postin (ENT) in [place] in 2 weeks. Wife wanted to make Dr Fletcher Anon aware that patient is still off aspirin and if there was any recommendation needed from him.

## 2017-04-27 NOTE — Telephone Encounter (Signed)
Patient had surgery to remove polyp in nose and he had some bleeding complications after and is still not taking asa 81 mg po q day   Please call to discuss

## 2017-04-28 NOTE — Telephone Encounter (Signed)
Patient's wife verbalized understanding of Dr Tyrell Antonio response and was appreciative.

## 2017-04-28 NOTE — Telephone Encounter (Signed)
No recommendations. Just resume Aspirin whenever possible.

## 2017-06-03 IMAGING — CR DG CHEST 2V
2 series · 2 of 2 positions shown · non-contrast
Comparison: PA and lateral chest x-ray October 15, 2012

CLINICAL DATA: Onset of chest pain, shortness of breath, left
facial numbness radiating into the left arm. History of previous
pulmonary emboli and coronary stent placement.

EXAM:
CHEST  2 VIEW

[chest lat]
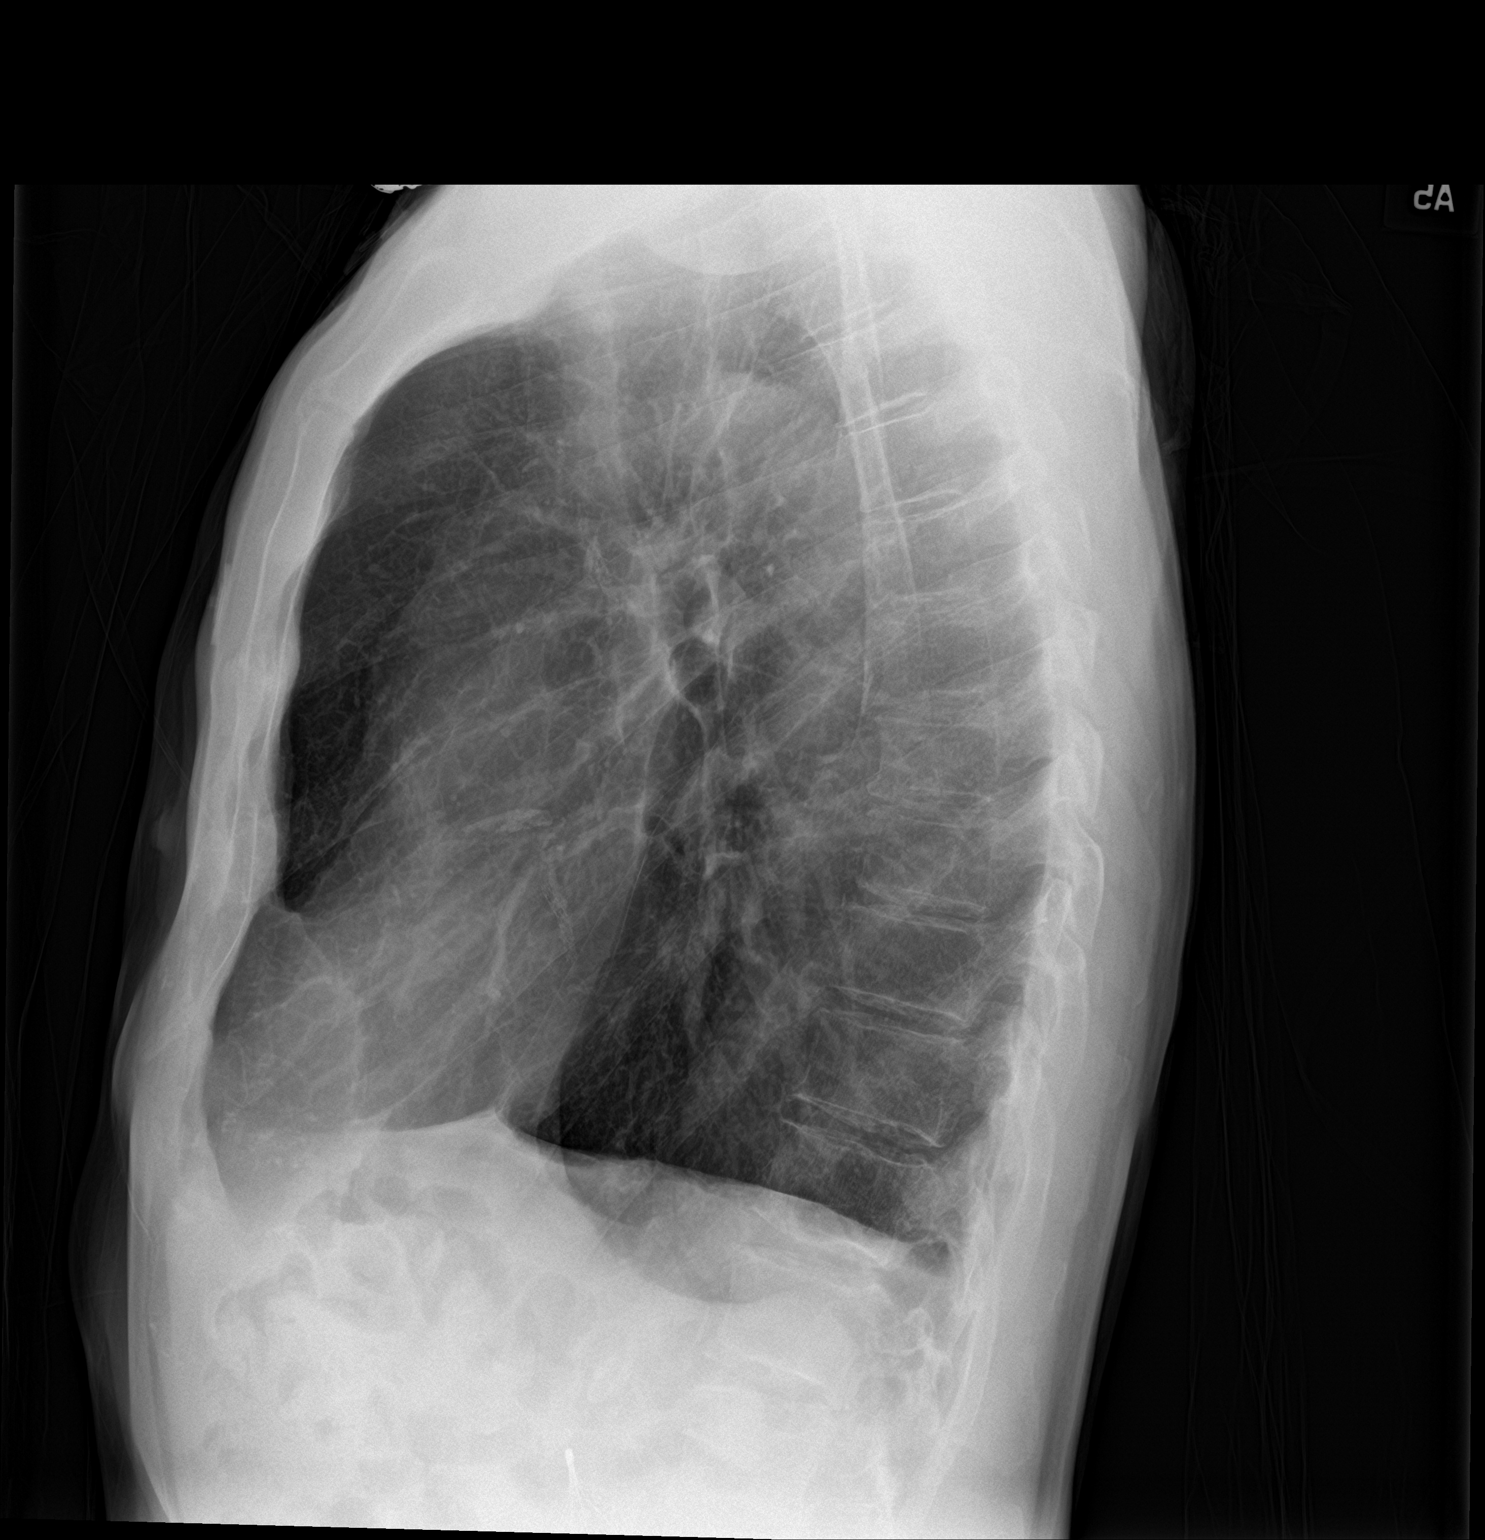

[chest pa]
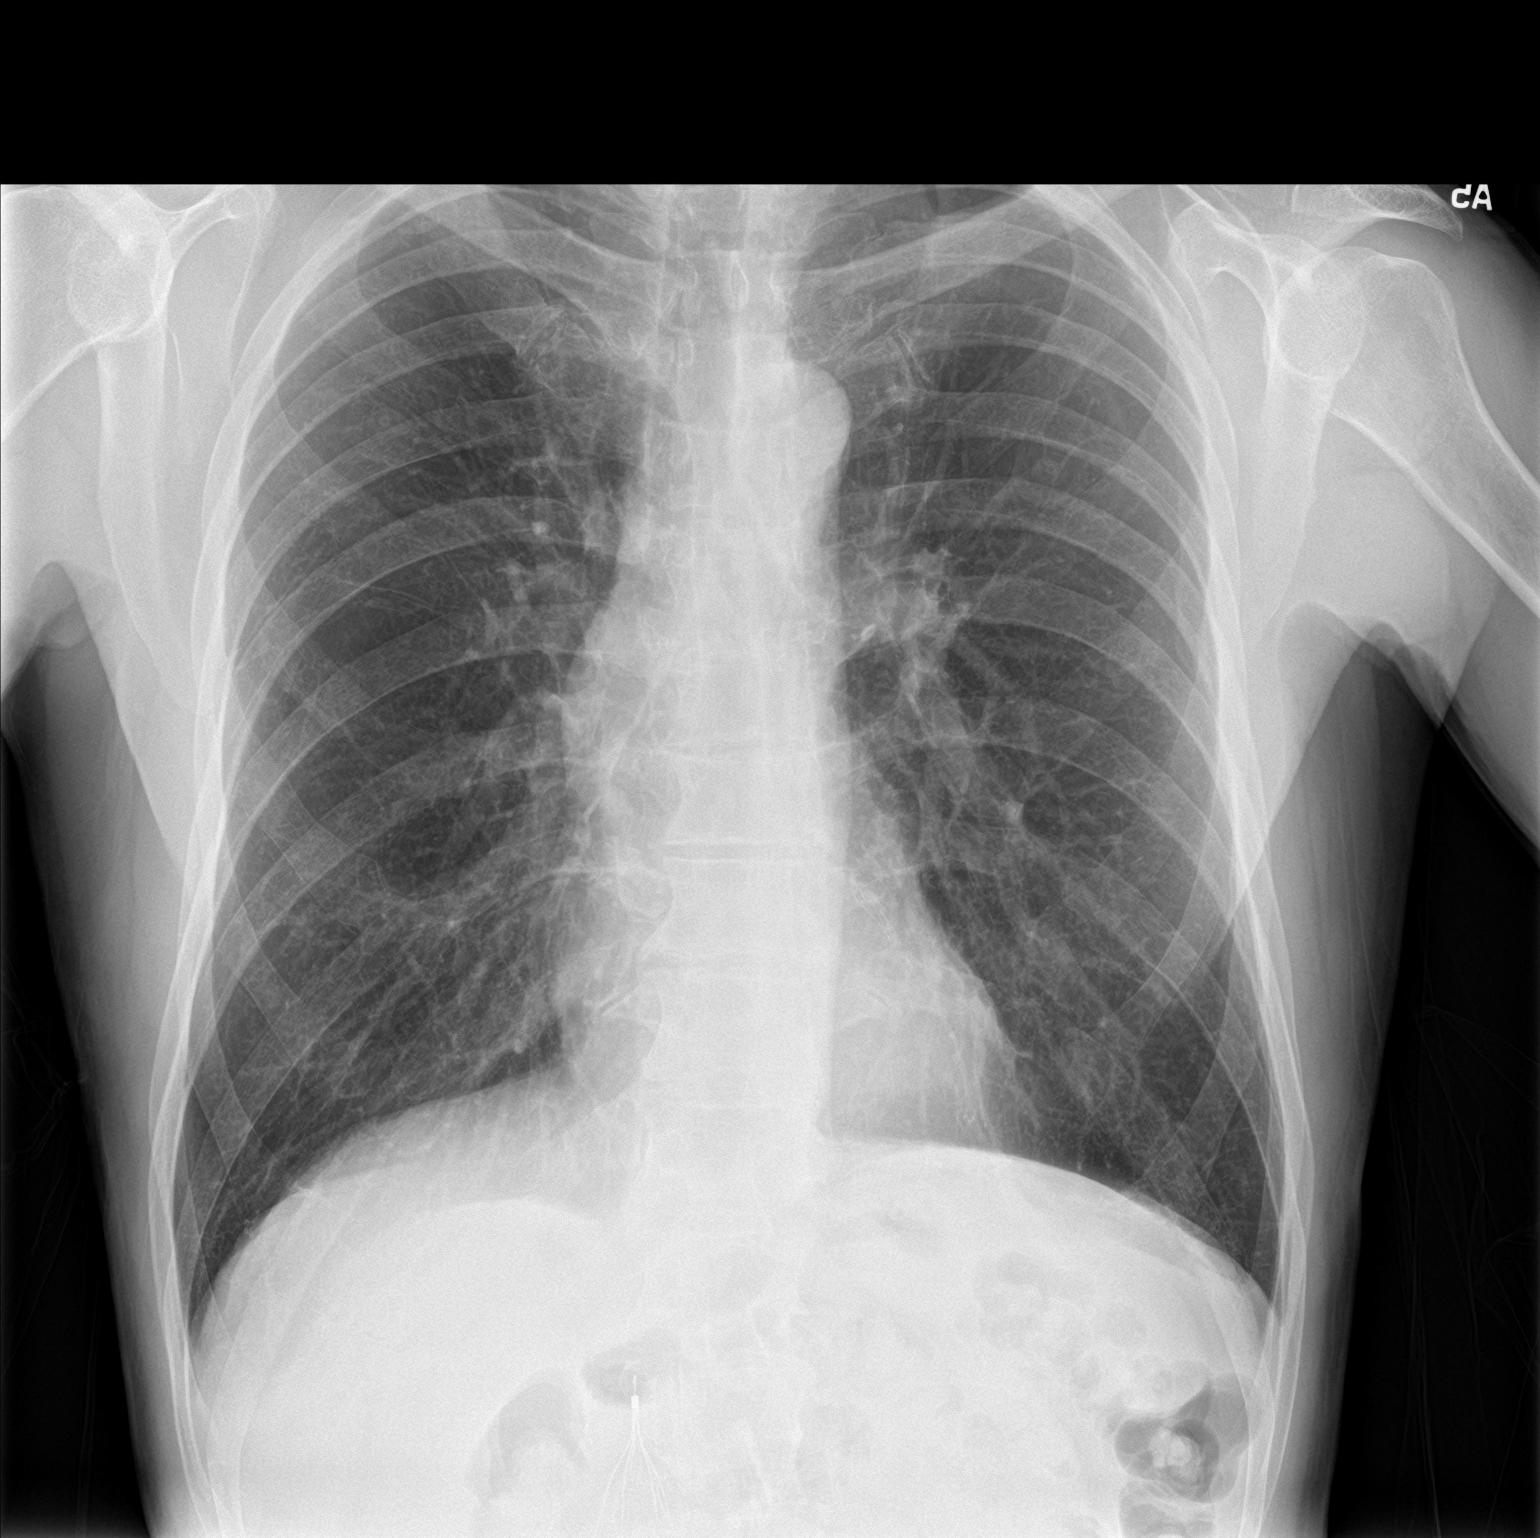

[2 of 2 positions shown; findings below may reference images not displayed]

FINDINGS: The lungs are well-expanded and clear. The heart and pulmonary
vascularity are normal. The mediastinum is normal in width. The
trachea is midline. The bony thorax exhibits no acute abnormality.
IMPRESSION: COPD. No pneumonia, CHF, nor other acute cardiopulmonary
abnormality.

## 2017-06-03 IMAGING — CT CT HEAD W/O CM
3 series · 16 of 46 positions shown, 19 images · non-contrast
Comparison: Brain MRI 08/27/2013.

CLINICAL DATA: Left face and upper extremity weakness for 10 hours.

EXAM:
CT HEAD WITHOUT CONTRAST
TECHNIQUE: Contiguous axial images were obtained from the base of the skull
through the vertex without intravenous contrast.

[Series 2: head wo · axial · 0.41mm/px · z∈[-118,+2]mm · 10 of 29 slices shown, 13 images]
[im 3/29  brain]
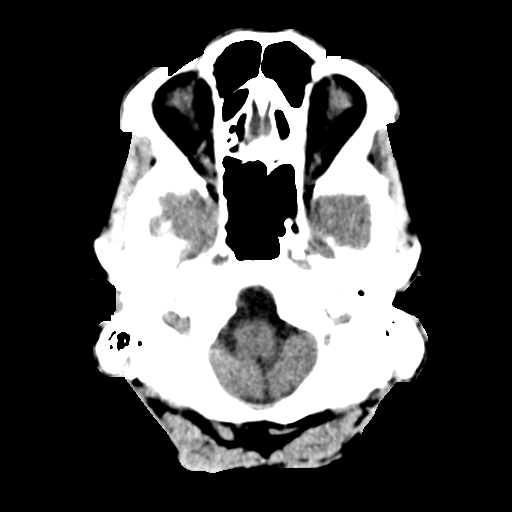
[im 3/29  bone]
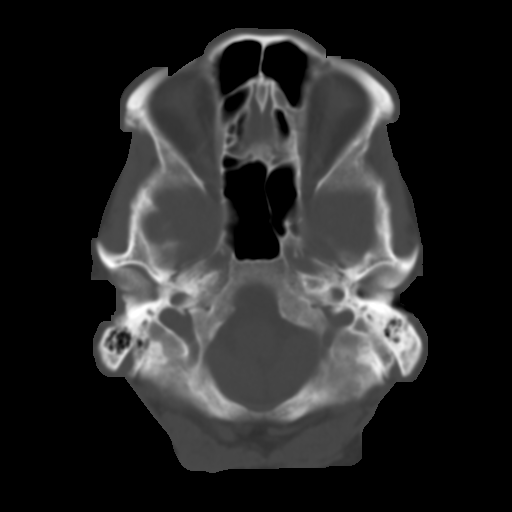
[im 6/29  brain]
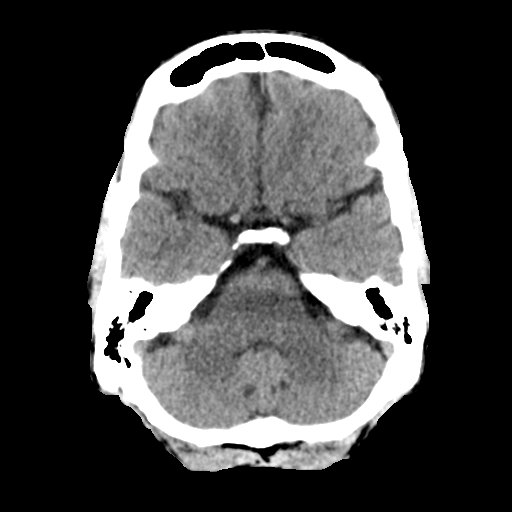
[im 8/29  brain]
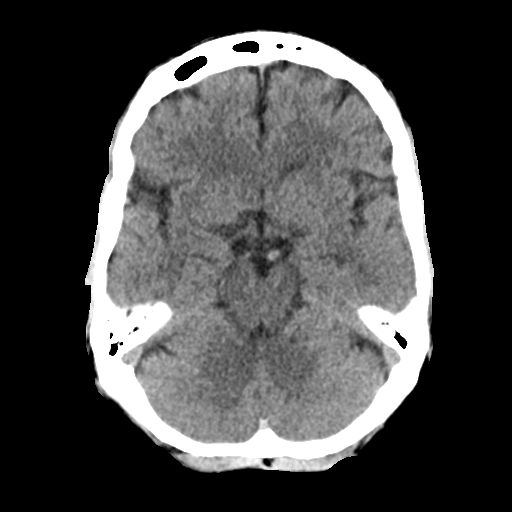
[im 11/29  brain]
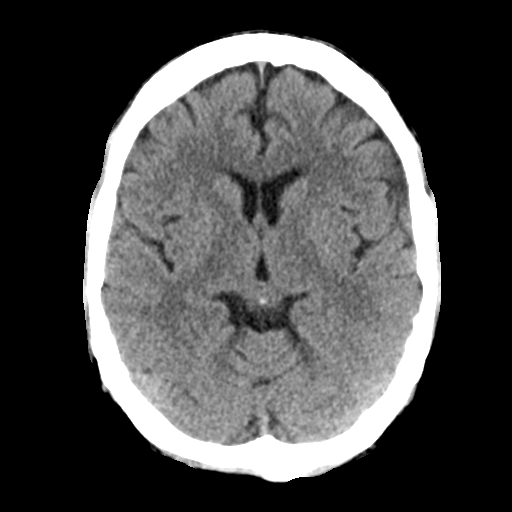
[im 14/29  brain]
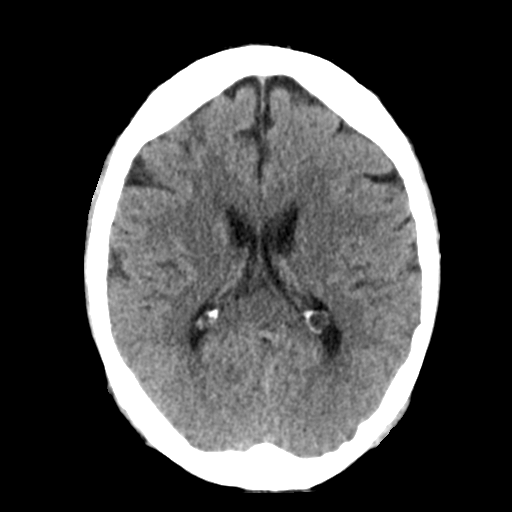
[im 14/29  bone]
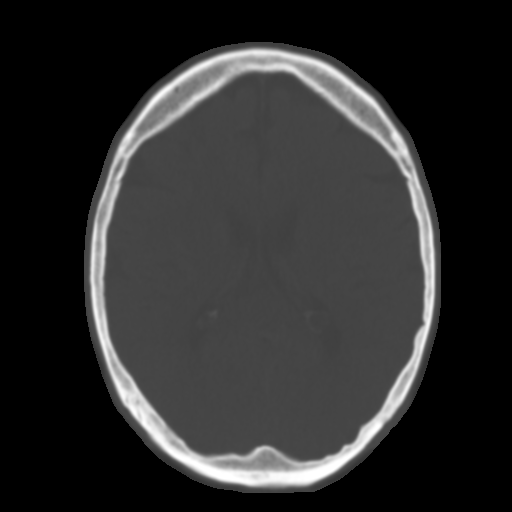
[im 16/29  brain]
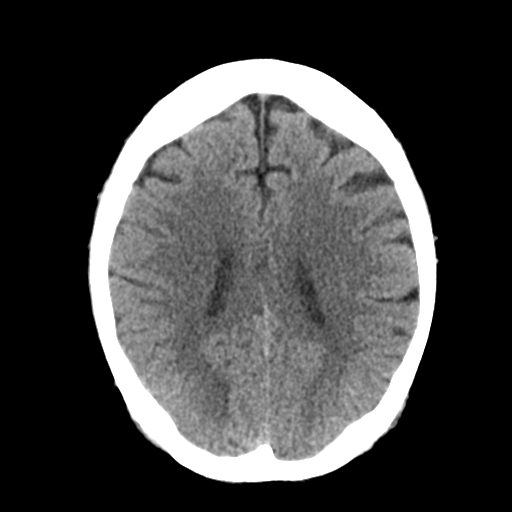
[im 19/29  brain]
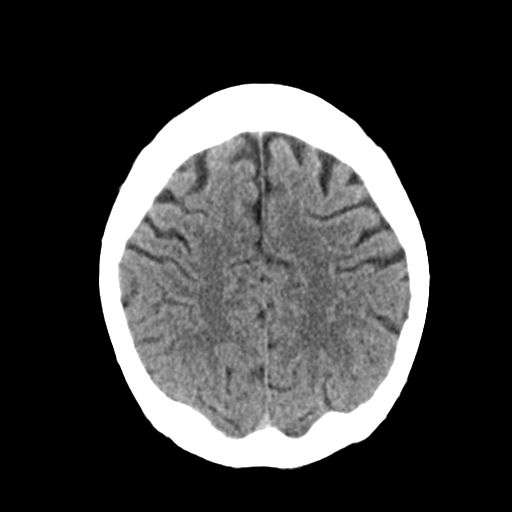
[im 22/29  brain]
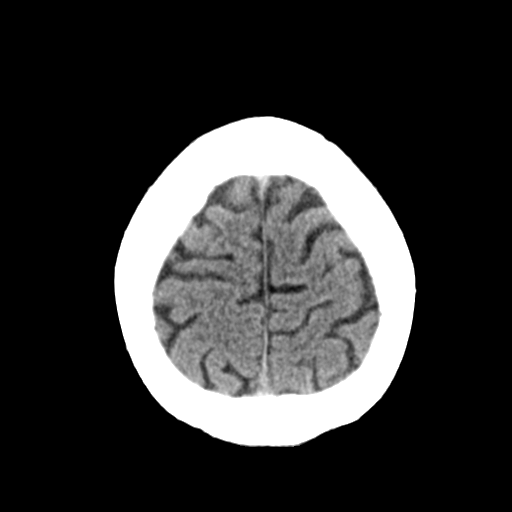
[im 24/29  brain]
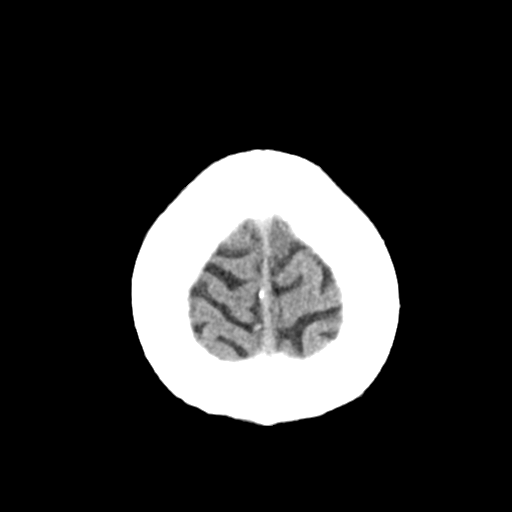
[im 24/29  bone]
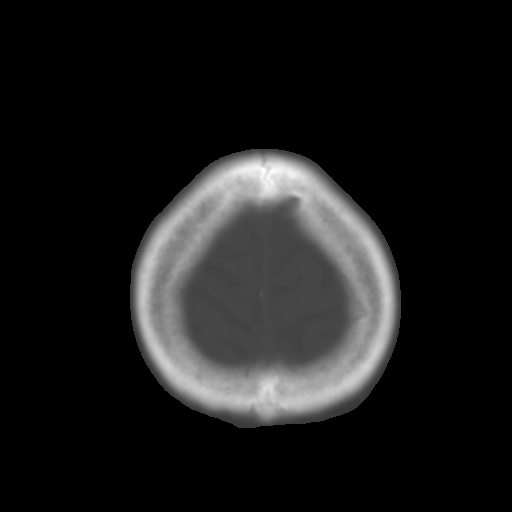
[im 27/29  brain]
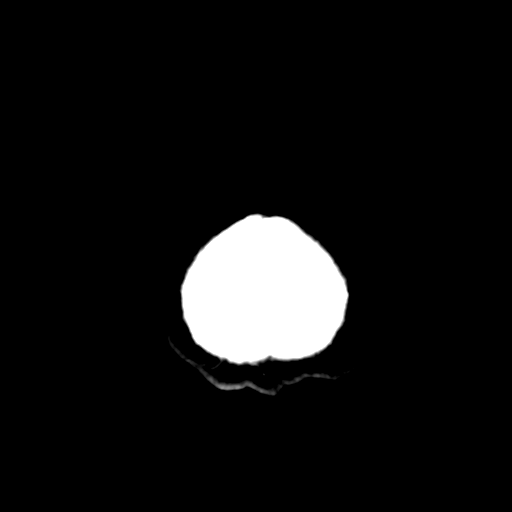

[Series 4: coronal soft tissue · coronal · 0.34mm/px · 3 of 63 slices shown]
[im 21/63  brain]
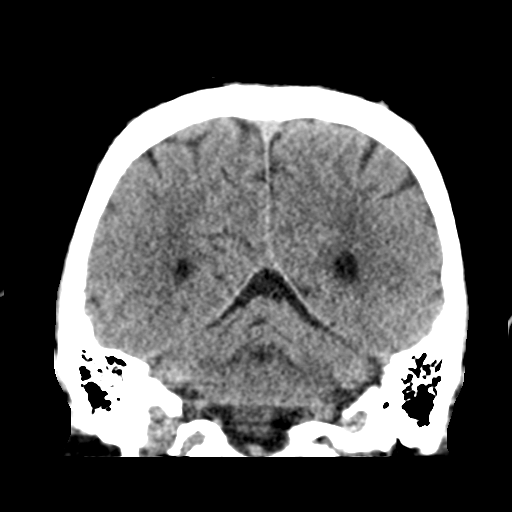
[im 28/63  brain]
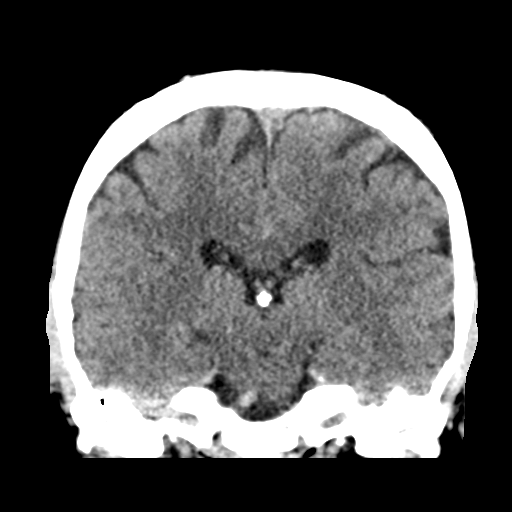
[im 35/63  brain]
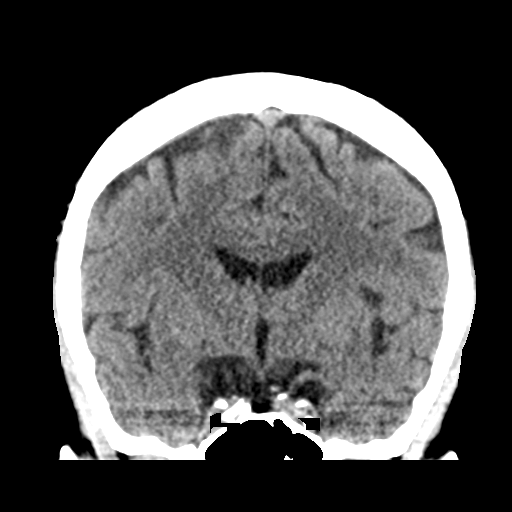

[Series 5: sagittal soft tissue · sagittal · 0.32mm/px · 3 of 50 slices shown]
[im 17/50  brain]
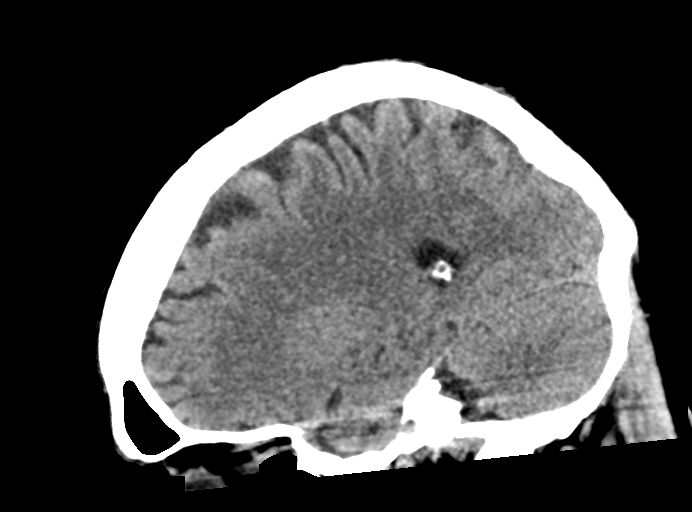
[im 25/50  brain]
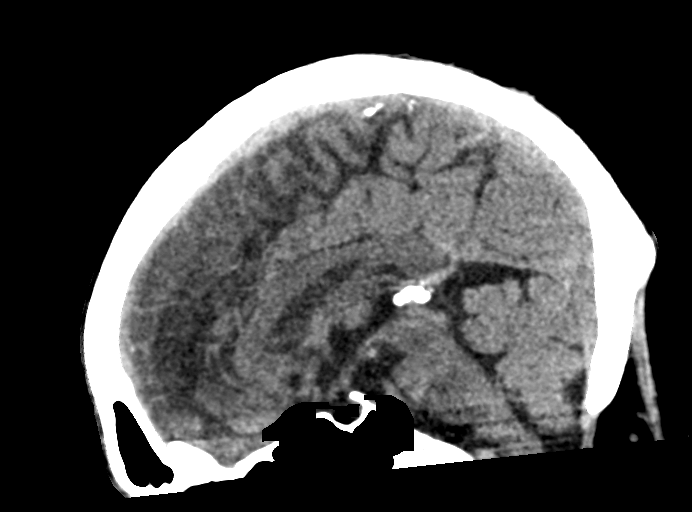
[im 33/50  brain]
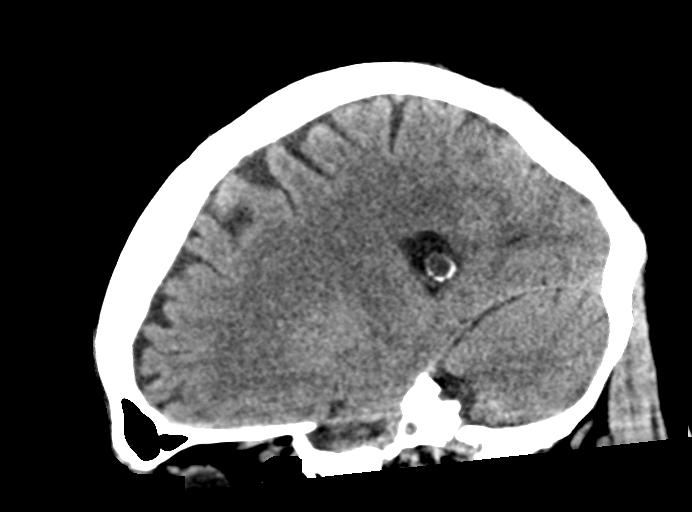

[16 of 46 positions shown; findings below may reference images not displayed]

FINDINGS: Brain: Appears normal without hemorrhage, infarct, mass lesion, mass
effect, midline shift or abnormal extra-axial fluid collection. No
hydrocephalus or pneumocephalus.

Vascular: Atherosclerotic vascular disease is noted.

Skull: Intact.

Sinuses/Orbits: Negative.

Other: None.
IMPRESSION: No acute abnormality.

Atherosclerosis.

## 2017-07-08 DEATH — deceased
